# Patient Record
Sex: Female | Born: 1990 | Race: White | Hispanic: No | Marital: Married | Smoking: Former smoker
Health system: Southern US, Community
[De-identification: ages and names within clinical notes are randomized; demographics above are authoritative.]

## PROBLEM LIST (undated history)

## (undated) DIAGNOSIS — O24419 Gestational diabetes mellitus in pregnancy, unspecified control: Secondary | ICD-10-CM

## (undated) DIAGNOSIS — Z789 Other specified health status: Secondary | ICD-10-CM

## (undated) DIAGNOSIS — J45909 Unspecified asthma, uncomplicated: Secondary | ICD-10-CM

## (undated) DIAGNOSIS — E669 Obesity, unspecified: Secondary | ICD-10-CM

## (undated) HISTORY — PX: CHOLECYSTECTOMY: SHX55

## (undated) HISTORY — DX: Obesity, unspecified: E66.9

## (undated) HISTORY — DX: Gestational diabetes mellitus in pregnancy, unspecified control: O24.419

## (undated) HISTORY — DX: Unspecified asthma, uncomplicated: J45.909

---

## 2016-06-23 ENCOUNTER — Encounter: Payer: Self-pay | Admitting: Emergency Medicine

## 2016-06-23 ENCOUNTER — Emergency Department
Admission: EM | Admit: 2016-06-23 | Discharge: 2016-06-24 | Disposition: A | Discharge status: Home / Self Care | Payer: 59 | Attending: Emergency Medicine | Admitting: Emergency Medicine

## 2016-06-23 ENCOUNTER — Emergency Department: Payer: 59

## 2016-06-23 DIAGNOSIS — O200 Threatened abortion: Secondary | ICD-10-CM | POA: Diagnosis not present

## 2016-06-23 DIAGNOSIS — Z87891 Personal history of nicotine dependence: Secondary | ICD-10-CM | POA: Insufficient documentation

## 2016-06-23 DIAGNOSIS — Z3A01 Less than 8 weeks gestation of pregnancy: Secondary | ICD-10-CM | POA: Diagnosis not present

## 2016-06-23 DIAGNOSIS — R7989 Other specified abnormal findings of blood chemistry: Secondary | ICD-10-CM | POA: Diagnosis present

## 2016-06-23 LAB — CBC WITH DIFFERENTIAL/PLATELET
BASOS ABS: 0.1 10*3/uL (ref 0–0.1)
BASOS PCT: 1 %
Eosinophils Absolute: 0.8 10*3/uL — ABNORMAL HIGH (ref 0–0.7)
Eosinophils Relative: 8 %
HEMATOCRIT: 42.8 % (ref 35.0–47.0)
HEMOGLOBIN: 15 g/dL (ref 12.0–16.0)
LYMPHS PCT: 21 %
Lymphs Abs: 2.3 10*3/uL (ref 1.0–3.6)
MCH: 30 pg (ref 26.0–34.0)
MCHC: 35.1 g/dL (ref 32.0–36.0)
MCV: 85.4 fL (ref 80.0–100.0)
Monocytes Absolute: 0.7 10*3/uL (ref 0.2–0.9)
Monocytes Relative: 6 %
NEUTROS ABS: 7 10*3/uL — AB (ref 1.4–6.5)
NEUTROS PCT: 64 %
Platelets: 310 10*3/uL (ref 150–440)
RBC: 5.01 MIL/uL (ref 3.80–5.20)
RDW: 12.9 % (ref 11.5–14.5)
WBC: 10.9 10*3/uL (ref 3.6–11.0)

## 2016-06-23 LAB — HCG, QUANTITATIVE, PREGNANCY: hCG, Beta Chain, Quant, S: 935 m[IU]/mL — ABNORMAL HIGH (ref <5)

## 2016-06-23 NOTE — ED Triage Notes (Signed)
Patient reports that she is 6 weeks and 5 days pregnant. Patient was sent by her ob because her hcg is not doubling and he wants her evaluated for ectopic pregnancy. Patient denies pain or vaginal bleeding.

## 2016-06-23 NOTE — ED Notes (Signed)
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  Pt was contacted by her MD and advised to come into ED for possible ectopic pregnancy.  Pt reports hCG levels not doubling as expected.  Pt denies any bleeding, denies any pain.  Pt A/Ox4, no immediate distress at this time.

## 2016-06-23 NOTE — ED Provider Notes (Signed)
[facility] Emergency Department Provider Note  ____________________________________________  Time seen: Approximately 8:10 PM  I have reviewed the triage vital signs and the nursing notes.   HISTORY  Chief Complaint Abnormal Lab  HPI Jennifer Best is a 25 y.o. female who presents to the emergency department for evaluation to rule out an ectopic pregnancy.  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 Her last beta HCG was drawn on Wednesday and the numbers "have not doubled." Her OB/GYN in [place] requested that she come to the ER for evaluation. She denies abdominal pain, vaginal discharge or bleeding. She denies nausea. She states that she feels like it's a "normal pregnancy."   History reviewed. No pertinent past medical history.  There are no active problems to display for this patient.   Past Surgical History:  Procedure Laterality Date  . CHOLECYSTECTOMY      Prior to Admission medications   Not on File    Allergies Latex and Morphine and related  No family history on file.  Social History Social History  Substance Use Topics  . Smoking status: Former Games developer  . Smokeless tobacco: Former Neurosurgeon  . Alcohol use Not on file    Review of Systems Constitutional: Negative for fever. Respiratory: Negative for shortness of breath or cough. Gastrointestinal: Negative for abdominal pain; Negative for nausea , Negative for vomiting. Genitourinary: Negative for dysuria , Negative for vaginal discharge. Musculoskeletal: Negative for back pain. Skin: Negative for lesion, wound, or rash. ____________________________________________   PHYSICAL EXAM:  VITAL SIGNS: ED Triage Vitals [06/23/16 1936]  Enc Vitals Group     BP 133/83     Pulse Rate 85     Resp 18     Temp 98.2 F (36.8 C)     Temp Source Oral     SpO2 99 %     Weight 210 lb (95.3 kg)     Height 5' 5.5" (1.664 m)     Head Circumference      Peak  Flow      Pain Score      Pain Loc      Pain Edu?      Excl. in GC?     Constitutional: Alert and oriented. Well appearing and in no acute distress. Eyes: Conjunctivae are normal. PERRL. EOMI. Head: Atraumatic. Nose: No congestion/rhinnorhea. Mouth/Throat: Mucous membranes are moist. Respiratory: Normal respiratory effort.  No retractions. Gastrointestinal: Soft, non-tender, no guarding or rebound. Genitourinary: Pelvic exam: deferred Musculoskeletal: No extremity tenderness nor edema.  Neurologic:  Normal speech and language. No gross focal neurologic deficits are appreciated. Speech is normal. No gait instability. Skin:  Skin is warm, dry and intact. No rash noted. Psychiatric: Mood and affect are normal. Speech and behavior are normal.  ____________________________________________   LABS (all labs ordered are listed, but only abnormal results are displayed)  Labs Reviewed - No data to display ____________________________________________  RADIOLOGY  Pending ____________________________________________   PROCEDURES  Procedure(s) performed: None  ____________________________________________   INITIAL IMPRESSION / ASSESSMENT AND PLAN / ED COURSE  Pertinent labs & imaging results that were available during my care of the patient were reviewed by me and considered in my medical decision making (see chart for details).  Patient transferred to room 7 to await ultrasound. [place] tech reports she is next to be seen. Report given to Dr. Manson Passey at 2320 and care relinquished at that time. ____________________________________________   FINAL CLINICAL IMPRESSION(S) / ED DIAGNOSES  Final diagnoses:  None    Note:  This document was prepared using Dragon voice recognition software and may include unintentional dictation errors.    Chinita Pester, FNP 06/23/16 [phone]

## 2016-06-24 NOTE — ED Provider Notes (Signed)
I assumed care of the patient from nurse practitioner Triplett approximately 11:00 PM ultrasound pending which revealed: CLINICAL DATA:  25 year old female with positive HCG levels presenting with evaluation for pregnancy. EXAM: OBSTETRIC <14 WK [place] AND TRANSVAGINAL OB US TECHNIQUE: Both transabdominal and transvaginal ultrasound examinations were performed for complete evaluation of the gestation as well as the maternal uterus, adnexal regions, and pelvic cul-de-sac. Transvaginal technique was performed to assess early pregnancy. COMPARISON:  None. FINDINGS: The uterus is retroverted and appears heterogeneous. There is an irregular fluid collection in the lower uterine canal. No fetal pole or yolk sac identified within this fluid collection. This may represent an abnormal gestational sac, a blighted ovum. A pseudo gestation of an ectopic pregnancy is not excluded. If this fluid collection is a true gestational sac the estimated gestational age based on mean sac diameter of 11 mm is 5 weeks, 6 days. No subchorionic hemorrhage identified. The maternal ovaries appear unremarkable. No free fluid noted within pelvis IMPRESSION: Irregular in fluid collection in the lower endometrium may represent an abnormal gestational sac or a blighted ovum. No fetal pole or yolk sac identified. Correlation with clinical exam and follow-up with serial HCG levels and ultrasound recommended. Electronically Signed   By: Elgie Collard M.D.   On: 06/24/2016 01:08 Spoke with the patient at length regarding the ultrasound findings and the need for follow-up with OB/GYN. Patient advised to return to emergency department immediately if she experienced abdominal pain and vaginal bleeding or any emergency medical concerns.   Darci Current, MD 06/24/16 [phone]

## 2016-06-27 ENCOUNTER — Other Ambulatory Visit: Payer: 59

## 2016-06-27 ENCOUNTER — Other Ambulatory Visit: Payer: Self-pay | Admitting: Obstetrics and Gynecology

## 2016-06-27 DIAGNOSIS — O2691 Pregnancy related conditions, unspecified, first trimester: Secondary | ICD-10-CM

## 2016-06-27 LAB — BETA HCG QUANT (REF LAB): hCG Quant: 1297 m[IU]/mL

## 2016-06-28 ENCOUNTER — Ambulatory Visit: Payer: 59 | Admitting: Obstetrics and Gynecology

## 2016-06-28 ENCOUNTER — Encounter: Payer: Self-pay | Admitting: Obstetrics and Gynecology

## 2016-06-28 ENCOUNTER — Ambulatory Visit (INDEPENDENT_AMBULATORY_CARE_PROVIDER_SITE_OTHER): Payer: 59 | Admitting: Obstetrics and Gynecology

## 2016-06-28 VITALS — BP 111/74 | HR 81 | Ht 65.51 in | Wt 215.0 lb

## 2016-06-28 DIAGNOSIS — E669 Obesity, unspecified: Secondary | ICD-10-CM

## 2016-06-28 DIAGNOSIS — O09811 Supervision of pregnancy resulting from assisted reproductive technology, first trimester: Secondary | ICD-10-CM

## 2016-06-28 DIAGNOSIS — O2691 Pregnancy related conditions, unspecified, first trimester: Secondary | ICD-10-CM | POA: Diagnosis not present

## 2016-06-30 ENCOUNTER — Other Ambulatory Visit: Payer: Self-pay

## 2016-06-30 ENCOUNTER — Ambulatory Visit (INDEPENDENT_AMBULATORY_CARE_PROVIDER_SITE_OTHER): Payer: 59 | Admitting: Obstetrics and Gynecology

## 2016-06-30 ENCOUNTER — Other Ambulatory Visit: Payer: Self-pay | Admitting: Obstetrics and Gynecology

## 2016-06-30 ENCOUNTER — Ambulatory Visit: Payer: 59 | Admitting: Obstetrics and Gynecology

## 2016-06-30 ENCOUNTER — Encounter: Payer: Self-pay | Admitting: Obstetrics and Gynecology

## 2016-06-30 ENCOUNTER — Other Ambulatory Visit: Payer: 59

## 2016-06-30 ENCOUNTER — Ambulatory Visit (INDEPENDENT_AMBULATORY_CARE_PROVIDER_SITE_OTHER): Payer: 59

## 2016-06-30 ENCOUNTER — Other Ambulatory Visit: Payer: Self-pay | Admitting: Internal Medicine

## 2016-06-30 ENCOUNTER — Inpatient Hospital Stay: Payer: 59 | Attending: Internal Medicine

## 2016-06-30 ENCOUNTER — Telehealth: Payer: Self-pay | Admitting: Internal Medicine

## 2016-06-30 VITALS — BP 128/84 | HR 83 | Temp 98.3°F | Resp 20

## 2016-06-30 DIAGNOSIS — O359XX0 Maternal care for (suspected) fetal abnormality and damage, unspecified, not applicable or unspecified: Secondary | ICD-10-CM

## 2016-06-30 DIAGNOSIS — Z8759 Personal history of other complications of pregnancy, childbirth and the puerperium: Secondary | ICD-10-CM | POA: Insufficient documentation

## 2016-06-30 DIAGNOSIS — O009 Unspecified ectopic pregnancy without intrauterine pregnancy: Secondary | ICD-10-CM

## 2016-06-30 DIAGNOSIS — O0091 Unspecified ectopic pregnancy with intrauterine pregnancy: Secondary | ICD-10-CM

## 2016-06-30 DIAGNOSIS — O008 Other ectopic pregnancy without intrauterine pregnancy: Secondary | ICD-10-CM | POA: Diagnosis present

## 2016-06-30 DIAGNOSIS — Z79899 Other long term (current) drug therapy: Secondary | ICD-10-CM | POA: Diagnosis not present

## 2016-06-30 DIAGNOSIS — O2691 Pregnancy related conditions, unspecified, first trimester: Secondary | ICD-10-CM

## 2016-06-30 DIAGNOSIS — O3680X0 Pregnancy with inconclusive fetal viability, not applicable or unspecified: Secondary | ICD-10-CM

## 2016-06-30 LAB — COMPREHENSIVE METABOLIC PANEL
ALT: 60 IU/L — AB (ref 0–32)
AST: 29 IU/L (ref 0–40)
Albumin/Globulin Ratio: 1.7 (ref 1.2–2.2)
Albumin: 4 g/dL (ref 3.5–5.5)
Alkaline Phosphatase: 98 IU/L (ref 39–117)
BILIRUBIN TOTAL: 0.3 mg/dL (ref 0.0–1.2)
BUN/Creatinine Ratio: 17 (ref 9–23)
BUN: 11 mg/dL (ref 6–20)
CALCIUM: 8.8 mg/dL (ref 8.7–10.2)
CHLORIDE: 105 mmol/L (ref 96–106)
CO2: 19 mmol/L (ref 18–29)
CREATININE: 0.65 mg/dL (ref 0.57–1.00)
GFR calc Af Amer: 142 mL/min/{1.73_m2} (ref >59)
GFR, EST NON AFRICAN AMERICAN: 123 mL/min/{1.73_m2} (ref >59)
GLUCOSE: 95 mg/dL (ref 65–99)
Globulin, Total: 2.3 g/dL (ref 1.5–4.5)
Potassium: 3.8 mmol/L (ref 3.5–5.2)
Sodium: 143 mmol/L (ref 134–144)
TOTAL PROTEIN: 6.3 g/dL (ref 6.0–8.5)

## 2016-06-30 LAB — ABO/RH: Rh Factor: POSITIVE

## 2016-06-30 LAB — BETA HCG QUANT (REF LAB): HCG QUANT: 1607 m[IU]/mL

## 2016-06-30 MED ORDER — PROCHLORPERAZINE MALEATE 10 MG PO TABS
10.0000 mg | ORAL_TABLET | Freq: Once | ORAL | Status: AC
  Administered 2016-06-30: 10 mg via ORAL
  Filled 2016-06-30: qty 1

## 2016-06-30 MED ORDER — METHOTREXATE SODIUM (PF) CHEMO INJECTION 250 MG/10ML
100.0000 mg | Freq: Once | INTRAMUSCULAR | Status: AC
  Administered 2016-06-30: 100 mg via INTRAMUSCULAR
  Filled 2016-06-30: qty 4

## 2016-06-30 MED ORDER — METHOTREXATE INJECTION FOR WOMEN'S HOSPITAL: 50.0000 mg/m2 | Freq: Once | INTRAMUSCULAR | Status: DC

## 2016-06-30 NOTE — Telephone Encounter (Signed)
Discussed with Dr.Cherry. Reviewed labs. Proceed with IV methotrxate today. Dr.B

## 2016-06-30 NOTE — Patient Instructions (Signed)
You will need to return on next Tuesday (07/04/16, and 07/07/16 for next blood draw)   Ectopic Pregnancy An ectopic pregnancy is when the fertilized egg attaches (implants) outside the uterus. Most ectopic pregnancies occur in the fallopian tube. Rarely do ectopic pregnancies occur on the ovary, intestine, pelvis, or cervix. In an ectopic pregnancy, the fertilized egg does not have the ability to develop into a normal, healthy baby.  A ruptured ectopic pregnancy is one in which the fallopian tube gets torn or bursts and results in internal bleeding. Often there is intense abdominal pain, and sometimes, vaginal bleeding. Having an ectopic pregnancy can be life threatening. If left untreated, this dangerous condition can lead to a blood transfusion, abdominal surgery, or even death. CAUSES  Damage to the fallopian tubes is the suspected cause in most ectopic pregnancies.  RISK FACTORS Depending on your circumstances, the risk of having an ectopic pregnancy will vary. The level of risk can be divided into three categories. High Risk  You have gone through infertility treatment.  You have had a previous ectopic pregnancy.  You have had previous tubal surgery.  You have had previous surgery to have the fallopian tubes tied (tubal ligation).  You have tubal problems or diseases.  You have been exposed to DES. DES is a medicine that was used until 1971 and had effects on babies whose mothers took the medicine.  You become pregnant while using an intrauterine device (IUD) for birth control. Moderate Risk  You have a history of infertility.  You have a history of a sexually transmitted infection (STI).  You have a history of pelvic inflammatory disease (PID).  You have scarring from endometriosis.  You have multiple sexual partners.  You smoke. Low Risk  You have had previous pelvic surgery.  You use vaginal douching.  You became sexually active before 26 years of age. SIGNS AND  SYMPTOMS  An ectopic pregnancy should be suspected in anyone who has missed a period and has abdominal pain or bleeding.  You may experience normal pregnancy symptoms, such as:  Nausea.  Tiredness.  Breast tenderness.  Other symptoms may include:  Pain with intercourse.  Irregular vaginal bleeding or spotting.  Cramping or pain on one side or in the lower abdomen.  Fast heartbeat.  Passing out while having a bowel movement.  Symptoms of a ruptured ectopic pregnancy and internal bleeding may include:  Sudden, severe pain in the abdomen and pelvis.  Dizziness or fainting.  Pain in the shoulder area. DIAGNOSIS  Tests that may be performed include:  A pregnancy test.  An ultrasound test.  Testing the specific level of pregnancy hormone in the bloodstream.  Taking a sample of uterus tissue (dilation and curettage, D&C).  Surgery to perform a visual exam of the inside of the abdomen using a thin, lighted tube with a tiny camera on the end (laparoscope). TREATMENT  An injection of a medicine called methotrexate may be given. This medicine causes the pregnancy tissue to be absorbed. It is given if:  The diagnosis is made early.  The fallopian tube has not ruptured.  You are considered to be a good candidate for the medicine. Usually, pregnancy hormone blood levels are checked after methotrexate treatment. This is to be sure the medicine is effective. It may take 4-6 weeks for the pregnancy to be absorbed (though most pregnancies will be absorbed by 3 weeks). Surgical treatment may be needed. A laparoscope may be used to remove the pregnancy tissue. If severe  internal bleeding occurs, a cut (incision) may be made in the lower abdomen (laparotomy), and the ectopic pregnancy is removed. This stops the bleeding. Part of the fallopian tube, or the whole tube, may be removed as well (salpingectomy). After surgery, pregnancy hormone tests may be done to be sure there is no  pregnancy tissue left. You may receive a Rho (D) immune globulin shot if you are Rh negative and the father is Rh positive, or if you do not know the Rh type of the father. This is to prevent problems with any future pregnancy. SEEK IMMEDIATE MEDICAL CARE IF:  You have any symptoms of an ectopic pregnancy. This is a medical emergency. MAKE SURE YOU:  Understand these instructions.  Will watch your condition.  Will get help right away if you are not doing well or get worse.   This information is not intended to replace advice given to you by your health care provider. Make sure you discuss any questions you have with your health care provider.   Document Released: 12/21/2004 Document Revised: 12/04/2014 Document Reviewed: 06/12/2013 Elsevier Interactive Patient Education 2016 Elsevier Inc.   Methotrexate Treatment for an Ectopic Pregnancy Methotrexate is a medicine that treats ectopic pregnancy by stopping the growth of the fertilized egg. It also helps your body absorb tissue from the egg. This takes between 2 weeks and 6 weeks. Most ectopic pregnancies can be successfully treated with methotrexate if they are detected early enough. LET [facility] CARE PROVIDER KNOW ABOUT:  Any allergies you have.  All medicines you are taking, including vitamins, herbs, eye drops, creams, and over-the-counter medicines.  Medical conditions you have. RISKS AND COMPLICATIONS Generally, this is a safe treatment. However, as with any treatment, problems can occur. Possible problems or side effects include:  Nausea.  Vomiting.  Diarrhea.  Abdominal cramping.  Mouth sores.  Increased vaginal bleeding or spotting.   Swelling or irritation of the lining of your lungs (pneumonitis).  Failed treatment and continuation of the pregnancy.   Liver damage.  Hair loss. There is still a risk of the ectopic pregnancy rupturing while using the methotrexate. BEFORE THE PROCEDURE Before you take  the medicine:   Liver tests, kidney tests, and a complete blood test are performed.  Blood tests are performed to measure the pregnancy hormone levels and to determine your blood type.  If you are Rh-negative and the father is Rh-positive or his Rh type is not known, you will be given a Rho (D) immune globulin shot. PROCEDURE  There are two methods that your health care provider may use to prescribe methotrexate. One method involves a single dose or injection of the medicine. Another method involves a series of doses given through several injections.  AFTER THE PROCEDURE  You may have some abdominal cramping, vaginal bleeding, and fatigue in the first few days after taking methotrexate.  Blood tests will be taken for several weeks to check the pregnancy hormone levels. The blood tests are performed until there is no more pregnancy hormone detected in the blood.   This information is not intended to replace advice given to you by your health care provider. Make sure you discuss any questions you have with your health care provider.   Document Released: 11/07/2001 Document Revised: 12/04/2014 Document Reviewed: 09/01/2013 Elsevier Interactive Patient Education [employer]

## 2016-06-30 NOTE — Progress Notes (Signed)
GYNECOLOGY PROGRESS NOTE  Subjective:    Patient ID: Jennifer Best, female    DOB: [birth year], 25 y.o.   MRN: [id number]  HPI  Patient is a 25 y.o. G61P2000 female who presents for follow up ultrasound and BHCG for abnormal pregnancy (abnormal HCG rise). Patient with h/o IVF, is a surrogate. Currently denies complaints today.  The following portions of the patient's history were reviewed and updated as appropriate: allergies, current medications, past family history, past medical history, past social history, past surgical history and problem list.  Review of Systems Pertinent items noted in HPI and remainder of comprehensive ROS otherwise negative.   Objective:   Blood pressure (!) 122/91, pulse 78, weight 214 lb 11.2 oz (97.4 kg), last menstrual period 05/04/2016. General appearance: alert and no distress Exam deferred.    Labs:  Results for URIANA, BUECHE (MRN [id number]) as of 06/30/2016 10:23  Ref. Range 06/23/2016 20:23 06/27/2016 11:33  HCG, Beta Chain, Quant, S Latest Ref Range: <5 mIU/mL 935 (H) 1,297  WBC Latest Ref Range: 3.6 - 11.0 K/uL 10.9   RBC Latest Ref Range: 3.80 - 5.20 MIL/uL 5.01   Hemoglobin Latest Ref Range: 12.0 - 16.0 g/dL 94.5   HCT Latest Ref Range: 35.0 - 47.0 % 42.8   MCV Latest Ref Range: 80.0 - 100.0 fL 85.4   MCH Latest Ref Range: 26.0 - 34.0 pg 30.0   MCHC Latest Ref Range: 32.0 - 36.0 g/dL 03.8   RDW Latest Ref Range: 11.5 - 14.5 % 12.9   Platelets Latest Ref Range: 150 - 440 K/uL 310   Neutrophils Latest Units: % 64   Lymphocytes Latest Units: % 21   Monocytes Relative Latest Units: % 6   Eosinophil Latest Units: % 8   Basophil Latest Units: % 1   NEUT# Latest Ref Range: 1.4 - 6.5 K/uL 7.0 (H)   Lymphocyte # Latest Ref Range: 1.0 - 3.6 K/uL 2.3   Monocyte # Latest Ref Range: 0.2 - 0.9 K/uL 0.7   Eosinophils Absolute Latest Ref Range: 0 - 0.7 K/uL 0.8 (H)   Basophils Absolute Latest Ref Range: 0 - 0.1 K/uL 0.1   hCG Quant Latest Units: mIU/mL  1     Imaging:  EXAM ULTRASOUND OB < [redacted] WEEKS GESTATION DATE OF PROCEDURE: 06/24/2016 FINDINGS: The uterus is retroverted and appears heterogeneous. There is an irregular fluid collection in the lower uterine canal. No fetal pole or yolk sac identified within this fluid collection. This may represent an abnormal gestational sac, a blighted ovum. A pseudo gestation of an ectopic pregnancy is not excluded. If this fluid collection is a true gestational sac the estimated gestational age based on mean sac diameter of 11 mm is 5 weeks, 6 days. No subchorionic hemorrhage identified. The maternal ovaries appear unremarkable. No free fluid noted within pelvis  IMPRESSION: Irregular in fluid collection in the lower endometrium may represent an abnormal gestational sac or a blighted ovum. No fetal pole or yolk sac identified. Correlation with clinical exam and follow-up with serial HCG levels and ultrasound recommended.   EXAM OB US < [redacted] WEEKS GESTATION DATE OF PROCEDURE: 06/30/2016 Indications: IVF for Dating Findings:  The uterus is anteflexed. No gestational sac or fetal pole is seen. The endometrium appears WNL with no gestational sac visualized.   Right Ovary measures 3.0 x 2.1 x 1.9 cm, and appears WNL. The right adnexa also was visualized and appears WNL. Left Ovary measures 1.8 x 1.5 x 1.6 cm. It  appears WNL. Survey of the left adnexa demonstrates a hyperechoic mass with a cystic appearing center adjacent to the left ovary which is highly suspicious for an ectopic pregnancy. There is no evidence of a corpus luteal cyst.  There is no free peritoneal fluid in the cul de sac.  Impression: 1. The uterus is anteflexed. No gestational sac or fetal pole is visualized. 2. In the left adnexa, adjacent to the left ovary, there is a mass that is highly suspicious for an ectopic pregnancy.  Assessment:   Left ectopic pregnancy Pregnancy from IVF  Plan:   - Patient with left ectopic pregnancy  noted on today's ultrasound.  BHCG levels have been abnormally rising, consistent with diagnosis.  Discussion had regarding ectopic pregnancy with patient.  Discussed need for management of ectopic, including medical management with MTX or surgical intervention.  Discussed risks/benefits of both.  After discussion, patient ok to proceed with MTX therapy.   - Will order Type and Screen, repeat BHCG, and CMP. - Will have MTX therapy administered today at [facility].  - Patient understands importance of close follow up.  Will need to return for Day #4 and Day #7 HCGs, then weekly thereafter until hormone levels are negative.  - Given ectopic precautions.    A total of 15 minutes were spent face-to-face with the patient during this encounter and over half of that time dealt with counseling and coordination of care.  Hildred Laser, MD [facility]

## 2016-06-30 NOTE — Progress Notes (Signed)
GYNECOLOGY PROGRESS NOTE  Subjective:    Patient ID: Jennifer Best, female    DOB: Aug 19, 1991, 25 y.o.   MRN: [id number]  HPI  Patient is a 25 y.o. G10P2002 female, EGA [redacted]w[redacted]d, EDD 02/12/2016 (dated by date of 5-day embryo transfer, on 05/26/2016)  who presents as a follow-up from the Emergency Room for possible miscarriage (cannot r/o ectopic pregnancy).  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  She has been having serial BHCGs drawn, and when they were noted to not have an appropriate rise she was encouraged to go to the Emergency Room for evaluation.  At that time patient denied any complaints (no abdominal pain, bleeding, nausea/vomiting).  Continues to deny complaints today.   Past Medical History:  Diagnosis Date  . Obesity (BMI 30-39.9)     OB History  Gravida Para Term Preterm AB Living  3 2 2         SAB TAB Ectopic Multiple Live Births               # Outcome Date GA Lbr Len/2nd Weight Sex Delivery Anes PTL Lv  3 Current           2 Term 2015   7 lb 1.6 oz (3.221 kg) F Vag-Spont     1 Term 2012   6 lb 2.2 oz (2.785 kg) F Vag-Spont         Family History  Problem Relation Age of Onset  . Lung cancer Mother   . Breast cancer Maternal Grandmother   . Lung cancer Paternal Grandmother   . Prostate cancer Paternal Grandfather     Past Surgical History:  Procedure Laterality Date  . CHOLECYSTECTOMY      Social History   Social History  . Marital status: Single    Spouse name: N/A  . Number of children: N/A  . Years of education: N/A   Occupational History  . Not on file.   Social History Main Topics  . Smoking status: Former Games developer  . Smokeless tobacco: Former Neurosurgeon  . Alcohol use No  . Drug use: No  . Sexual activity: Not Currently   Other Topics Concern  . Not on file   Social History Narrative  . No narrative on file     Allergies  Allergen Reactions  . Latex   . Morphine And Related      Outpatient Encounter Prescriptions as of 06/28/2016  Medication Sig  . Prenatal Vit-Fe Fumarate-FA (MULTIVITAMIN-PRENATAL) 27-0.8 MG TABS tablet Take 1 tablet by mouth daily at 12 noon.  . estrogens conjugated, synthetic A, (CENESTIN) 1.25 MG tablet Take 1.25 mg by mouth daily.  . progesterone 50 MG/ML injection Inject 10 mg into the muscle daily.   No facility-administered encounter medications on file as of 06/28/2016.     Review of Systems Pertinent items noted in HPI and remainder of comprehensive ROS otherwise negative.   Objective:   Blood pressure 111/74, pulse 81, height 5' 5.51" (1.664 m), weight 215 lb (97.5 kg), last menstrual period 05/04/2016. Body mass index is 35.22 kg/m.  General appearance: alert and no distress, moderately obese. Abdomen: Non-tender, no palpable masses.  Remainder of exam deferred.    Labs:  Results for SHANIQA, MCGEORGE (MRN [id number]) as of 06/30/2016 10:23  Ref. Range 06/23/2016 20:23 06/27/2016 11:33  HCG, Beta Chain, Quant, S Latest Ref Range: <5 mIU/mL 935 (H) 1,297 (H)  WBC Latest Ref Range: 3.6 -  11.0 K/uL 10.9   RBC Latest Ref Range: 3.80 - 5.20 MIL/uL 5.01   Hemoglobin Latest Ref Range: 12.0 - 16.0 g/dL 09.8   HCT Latest Ref Range: 35.0 - 47.0 % 42.8   MCV Latest Ref Range: 80.0 - 100.0 fL 85.4   MCH Latest Ref Range: 26.0 - 34.0 pg 30.0   MCHC Latest Ref Range: 32.0 - 36.0 g/dL 11.9   RDW Latest Ref Range: 11.5 - 14.5 % 12.9   Platelets Latest Ref Range: 150 - 440 K/uL 310   Neutrophils Latest Units: % 64   Lymphocytes Latest Units: % 21   Monocytes Relative Latest Units: % 6   Eosinophil Latest Units: % 8   Basophil Latest Units: % 1   NEUT# Latest Ref Range: 1.4 - 6.5 K/uL 7.0 (H)   Lymphocyte # Latest Ref Range: 1.0 - 3.6 K/uL 2.3   Monocyte # Latest Ref Range: 0.2 - 0.9 K/uL 0.7   Eosinophils Absolute Latest Ref Range: 0 - 0.7 K/uL 0.8 (H)   Basophils Absolute Latest Ref Range: 0 - 0.1 K/uL 0.1   hCG Quant  Latest Units: mIU/mL       Imaging:  EXAM ULTRASOUND OB < [redacted] WEEKS GESTATION DATE OF PROCEDURE: 06/24/2016 FINDINGS: The uterus is retroverted and appears heterogeneous. There is an irregular fluid collection in the lower uterine canal. No fetal pole or yolk sac identified within this fluid collection. This may represent an abnormal gestational sac, a blighted ovum. A pseudo gestation of an ectopic pregnancy is not excluded. If this fluid collection is a true gestational sac the estimated gestational age based on mean sac diameter of 11 mm is 5 weeks, 6 days. No subchorionic hemorrhage identified. The maternal ovaries appear unremarkable. No free fluid noted within pelvis  IMPRESSION: Irregular in fluid collection in the lower endometrium may represent an abnormal gestational sac or a blighted ovum. No fetal pole or yolk sac identified. Correlation with clinical exam and follow-up with serial HCG levels and ultrasound recommended.  Assessment:   Abnormal pregnancy, possible blighted ovum (cannot r/o ectopic) Recent h/o IVF (5-day embryo transfer)  Plan:   - Discussion had with regards to recent labs and ultrasound.  Possible blighted ovum, but cannot r/o ectopic.  Recent BHCGlevels increasing, but not appropriately doubling.  Concern Will have patient f/u in 2 days for repeat BHCG and ultrasound.  If BHCG levels continuing to rise, should be able to better discern pregnancy on ultrasound this time.  Patient given SAB/ectopic precautions.  - Patient reports that she discontinued taking her fertility hormones.  Encouraged to resume until official diagnosis has been made.  - Will attempt to get previous medical records from [place].    Hildred Laser, MD [facility]

## 2016-06-30 NOTE — Patient Instructions (Signed)
Return in 2 days for ultrasound and blood draw.

## 2016-07-03 ENCOUNTER — Telehealth: Payer: Self-pay

## 2016-07-03 ENCOUNTER — Other Ambulatory Visit: Payer: 59

## 2016-07-03 ENCOUNTER — Other Ambulatory Visit: Payer: Self-pay | Admitting: Obstetrics and Gynecology

## 2016-07-03 DIAGNOSIS — O009 Unspecified ectopic pregnancy without intrauterine pregnancy: Secondary | ICD-10-CM

## 2016-07-03 NOTE — Telephone Encounter (Signed)
Called pt she states that she just came in today for blood work, advised pt to come in on Friday for blood work. Pt on lab schedule for 8:15am.

## 2016-07-03 NOTE — Telephone Encounter (Signed)
-----   Message from Hildred LaserAnika Cherry, MD sent at 07/03/2016  1:39 PM EDT ----- Regarding: lab draw Please inform patient of need to return for BHCG levels on tomorrow and Friday to ensure levels are trending downward.

## 2016-07-04 LAB — BETA HCG QUANT (REF LAB): HCG QUANT: 2243 m[IU]/mL

## 2016-07-07 ENCOUNTER — Other Ambulatory Visit: Payer: Self-pay

## 2016-07-07 ENCOUNTER — Other Ambulatory Visit: Payer: Self-pay | Admitting: Obstetrics and Gynecology

## 2016-07-07 ENCOUNTER — Other Ambulatory Visit: Payer: 59

## 2016-07-07 DIAGNOSIS — O009 Unspecified ectopic pregnancy without intrauterine pregnancy: Secondary | ICD-10-CM

## 2016-07-08 LAB — BETA HCG QUANT (REF LAB): HCG QUANT: 1728 m[IU]/mL

## 2016-07-10 NOTE — Progress Notes (Signed)
Letter of medical necessity written for methotrexate, as [facility] due to prior authorization for medication.

## 2016-07-11 ENCOUNTER — Telehealth: Payer: Self-pay

## 2016-07-11 ENCOUNTER — Other Ambulatory Visit: Payer: 59

## 2016-07-11 ENCOUNTER — Other Ambulatory Visit: Payer: Self-pay | Admitting: Obstetrics and Gynecology

## 2016-07-11 NOTE — Telephone Encounter (Signed)
-----   Message from Hildred LaserAnika Cherry, MD sent at 07/11/2016  1:43 PM EDT ----- Please have patient follow up for additional blood work.  Will need another repeat BHCG and repeat CBC and Chem 14.  If hormone levels are hanging steady, she may require an addition dose of methotrexate which we will arrange.  If continuing to decline, she will just need weekly hormone level checks until it returns to negative range.

## 2016-07-11 NOTE — Telephone Encounter (Signed)
Called pt informed her of the information below. Pt gave verbal understanding.  

## 2016-07-12 LAB — BETA HCG QUANT (REF LAB): HCG QUANT: 1299 m[IU]/mL

## 2016-07-17 ENCOUNTER — Encounter
Admission: EM | Disposition: A | Discharge status: Home / Self Care | Payer: Self-pay | Source: Home / Self Care | Attending: Emergency Medicine

## 2016-07-17 ENCOUNTER — Observation Stay
Admission: EM | Admit: 2016-07-17 | Discharge: 2016-07-18 | Disposition: A | Discharge status: Home / Self Care | Payer: 59 | Attending: Obstetrics and Gynecology | Admitting: Obstetrics and Gynecology

## 2016-07-17 ENCOUNTER — Emergency Department: Payer: 59

## 2016-07-17 ENCOUNTER — Emergency Department: Payer: 59 | Admitting: Anesthesiology

## 2016-07-17 DIAGNOSIS — Z87891 Personal history of nicotine dependence: Secondary | ICD-10-CM | POA: Insufficient documentation

## 2016-07-17 DIAGNOSIS — Z8042 Family history of malignant neoplasm of prostate: Secondary | ICD-10-CM | POA: Insufficient documentation

## 2016-07-17 DIAGNOSIS — R102 Pelvic and perineal pain: Secondary | ICD-10-CM | POA: Diagnosis present

## 2016-07-17 DIAGNOSIS — O001 Tubal pregnancy without intrauterine pregnancy: Principal | ICD-10-CM | POA: Insufficient documentation

## 2016-07-17 DIAGNOSIS — Z6830 Body mass index (BMI) 30.0-30.9, adult: Secondary | ICD-10-CM | POA: Diagnosis not present

## 2016-07-17 DIAGNOSIS — E669 Obesity, unspecified: Secondary | ICD-10-CM | POA: Insufficient documentation

## 2016-07-17 DIAGNOSIS — O009 Unspecified ectopic pregnancy without intrauterine pregnancy: Secondary | ICD-10-CM | POA: Diagnosis not present

## 2016-07-17 DIAGNOSIS — Z803 Family history of malignant neoplasm of breast: Secondary | ICD-10-CM | POA: Diagnosis not present

## 2016-07-17 DIAGNOSIS — Z801 Family history of malignant neoplasm of trachea, bronchus and lung: Secondary | ICD-10-CM | POA: Insufficient documentation

## 2016-07-17 DIAGNOSIS — Z885 Allergy status to narcotic agent status: Secondary | ICD-10-CM | POA: Insufficient documentation

## 2016-07-17 DIAGNOSIS — Z91040 Latex allergy status: Secondary | ICD-10-CM | POA: Diagnosis not present

## 2016-07-17 DIAGNOSIS — K661 Hemoperitoneum: Secondary | ICD-10-CM | POA: Diagnosis present

## 2016-07-17 DIAGNOSIS — O00102 Left tubal pregnancy without intrauterine pregnancy: Secondary | ICD-10-CM

## 2016-07-17 HISTORY — PX: UNILATERAL SALPINGECTOMY: SHX6160

## 2016-07-17 HISTORY — PX: LAPAROSCOPY: SHX197

## 2016-07-17 LAB — CBC WITH DIFFERENTIAL/PLATELET
Basophils Absolute: 0.1 10*3/uL (ref 0–0.1)
Basophils Relative: 1 %
EOS ABS: 0.2 10*3/uL (ref 0–0.7)
Eosinophils Relative: 3 %
HCT: 39.9 % (ref 35.0–47.0)
HEMOGLOBIN: 14 g/dL (ref 12.0–16.0)
LYMPHS ABS: 1.4 10*3/uL (ref 1.0–3.6)
LYMPHS PCT: 20 %
MCH: 30.7 pg (ref 26.0–34.0)
MCHC: 35.1 g/dL (ref 32.0–36.0)
MCV: 87.3 fL (ref 80.0–100.0)
MONOS PCT: 6 %
Monocytes Absolute: 0.4 10*3/uL (ref 0.2–0.9)
NEUTROS PCT: 70 %
Neutro Abs: 4.8 10*3/uL (ref 1.4–6.5)
Platelets: 251 10*3/uL (ref 150–440)
RBC: 4.57 MIL/uL (ref 3.80–5.20)
RDW: 12.9 % (ref 11.5–14.5)
WBC: 6.9 10*3/uL (ref 3.6–11.0)

## 2016-07-17 LAB — POCT PREGNANCY, URINE: PREG TEST UR: POSITIVE — AB

## 2016-07-17 LAB — BASIC METABOLIC PANEL
Anion gap: 9 (ref 5–15)
BUN: 6 mg/dL (ref 6–20)
CHLORIDE: 109 mmol/L (ref 101–111)
CO2: 21 mmol/L — AB (ref 22–32)
CREATININE: 0.66 mg/dL (ref 0.44–1.00)
Calcium: 9.3 mg/dL (ref 8.9–10.3)
GFR calc non Af Amer: >60 mL/min (ref >60)
Glucose, Bld: 104 mg/dL — ABNORMAL HIGH (ref 65–99)
POTASSIUM: 3.9 mmol/L (ref 3.5–5.1)
Sodium: 139 mmol/L (ref 135–145)

## 2016-07-17 LAB — HCG, QUANTITATIVE, PREGNANCY: hCG, Beta Chain, Quant, S: 696 m[IU]/mL — ABNORMAL HIGH (ref <5)

## 2016-07-17 LAB — TYPE AND SCREEN
ABO/RH(D): O POS
Antibody Screen: NEGATIVE

## 2016-07-17 SURGERY — UNILATERAL SALPINGECTOMY
Anesthesia: General | Laterality: Left

## 2016-07-17 SURGERY — LAPAROTOMY, EXPLORATORY
Anesthesia: Choice | Laterality: Left

## 2016-07-17 MED ORDER — BUPIVACAINE HCL 0.5 % IJ SOLN
INTRAMUSCULAR | Status: DC | PRN
  Administered 2016-07-17: 10 mL

## 2016-07-17 MED ORDER — OXYCODONE-ACETAMINOPHEN 5-325 MG PO TABS
1.0000 | ORAL_TABLET | ORAL | Status: DC | PRN
  Administered 2016-07-18 (×2): 1 via ORAL
  Filled 2016-07-17: qty 1
  Filled 2016-07-17: qty 2

## 2016-07-17 MED ORDER — KETOROLAC TROMETHAMINE 30 MG/ML IJ SOLN
INTRAMUSCULAR | Status: AC
  Filled 2016-07-17: qty 1

## 2016-07-17 MED ORDER — SODIUM CHLORIDE 0.9 % IV BOLUS (SEPSIS)
1000.0000 mL | Freq: Once | INTRAVENOUS | Status: AC
  Administered 2016-07-17: 1000 mL via INTRAVENOUS

## 2016-07-17 MED ORDER — FENTANYL CITRATE (PF) 100 MCG/2ML IJ SOLN
INTRAMUSCULAR | Status: AC
  Filled 2016-07-17: qty 2

## 2016-07-17 MED ORDER — ONDANSETRON HCL 4 MG PO TABS: 4.0000 mg | ORAL_TABLET | Freq: Four times a day (QID) | ORAL | Status: DC | PRN

## 2016-07-17 MED ORDER — LACTATED RINGERS IV SOLN
INTRAVENOUS | Status: DC
  Administered 2016-07-17: 23:00:00 via INTRAVENOUS

## 2016-07-17 MED ORDER — BISACODYL 10 MG RE SUPP: 10.0000 mg | Freq: Every day | RECTAL | Status: DC | PRN

## 2016-07-17 MED ORDER — LIDOCAINE HCL (CARDIAC) 20 MG/ML IV SOLN
INTRAVENOUS | Status: DC | PRN
  Administered 2016-07-17: 30 mg via INTRAVENOUS

## 2016-07-17 MED ORDER — BUPIVACAINE HCL (PF) 0.5 % IJ SOLN
INTRAMUSCULAR | Status: AC
  Filled 2016-07-17: qty 30

## 2016-07-17 MED ORDER — PROPOFOL 10 MG/ML IV BOLUS
INTRAVENOUS | Status: DC | PRN
  Administered 2016-07-17: 200 mg via INTRAVENOUS

## 2016-07-17 MED ORDER — IBUPROFEN 600 MG PO TABS
600.0000 mg | ORAL_TABLET | Freq: Four times a day (QID) | ORAL | Status: DC | PRN
  Administered 2016-07-18: 600 mg via ORAL
  Filled 2016-07-17: qty 1

## 2016-07-17 MED ORDER — SIMETHICONE 80 MG PO CHEW: 80.0000 mg | CHEWABLE_TABLET | Freq: Four times a day (QID) | ORAL | Status: DC | PRN

## 2016-07-17 MED ORDER — LACTATED RINGERS IV SOLN
INTRAVENOUS | Status: DC | PRN
  Administered 2016-07-17: 21:00:00 via INTRAVENOUS

## 2016-07-17 MED ORDER — MAGNESIUM CITRATE PO SOLN: 1.0000 | Freq: Once | ORAL | Status: DC | PRN

## 2016-07-17 MED ORDER — FENTANYL CITRATE (PF) 100 MCG/2ML IJ SOLN
INTRAMUSCULAR | Status: DC | PRN
  Administered 2016-07-17: 50 ug via INTRAVENOUS
  Administered 2016-07-17: 100 ug via INTRAVENOUS
  Administered 2016-07-17: 50 ug via INTRAVENOUS

## 2016-07-17 MED ORDER — ONDANSETRON HCL 4 MG/2ML IJ SOLN: 4.0000 mg | Freq: Four times a day (QID) | INTRAMUSCULAR | Status: DC | PRN

## 2016-07-17 MED ORDER — MIDAZOLAM HCL 2 MG/2ML IJ SOLN
INTRAMUSCULAR | Status: DC | PRN
  Administered 2016-07-17: 2 mg via INTRAVENOUS

## 2016-07-17 MED ORDER — HYDROMORPHONE HCL 1 MG/ML IJ SOLN: 0.2000 mg | INTRAMUSCULAR | Status: DC | PRN

## 2016-07-17 MED ORDER — DEXAMETHASONE SODIUM PHOSPHATE 10 MG/ML IJ SOLN
INTRAMUSCULAR | Status: DC | PRN
  Administered 2016-07-17: 10 mg via INTRAVENOUS

## 2016-07-17 MED ORDER — ZOLPIDEM TARTRATE 5 MG PO TABS: 5.0000 mg | ORAL_TABLET | Freq: Every evening | ORAL | Status: DC | PRN

## 2016-07-17 MED ORDER — ALUM & MAG HYDROXIDE-SIMETH 200-200-20 MG/5ML PO SUSP: 30.0000 mL | ORAL | Status: DC | PRN

## 2016-07-17 MED ORDER — PROMETHAZINE HCL 25 MG/ML IJ SOLN: 6.2500 mg | INTRAMUSCULAR | Status: DC | PRN

## 2016-07-17 MED ORDER — FENTANYL CITRATE (PF) 100 MCG/2ML IJ SOLN
25.0000 ug | INTRAMUSCULAR | Status: DC | PRN
  Administered 2016-07-17 (×4): 25 ug via INTRAVENOUS

## 2016-07-17 MED ORDER — FENTANYL CITRATE (PF) 100 MCG/2ML IJ SOLN
50.0000 ug | Freq: Once | INTRAMUSCULAR | Status: AC
  Administered 2016-07-17: 50 ug via INTRAVENOUS
  Filled 2016-07-17: qty 2

## 2016-07-17 MED ORDER — ONDANSETRON HCL 4 MG/2ML IJ SOLN
INTRAMUSCULAR | Status: DC | PRN
  Administered 2016-07-17: 4 mg via INTRAVENOUS

## 2016-07-17 MED ORDER — KETOROLAC TROMETHAMINE 30 MG/ML IJ SOLN
30.0000 mg | Freq: Once | INTRAMUSCULAR | Status: AC
  Administered 2016-07-17: 30 mg via INTRAVENOUS

## 2016-07-17 MED ORDER — SUGAMMADEX SODIUM 200 MG/2ML IV SOLN
INTRAVENOUS | Status: DC | PRN
  Administered 2016-07-17: 192.4 mg via INTRAVENOUS

## 2016-07-17 MED ORDER — MENTHOL 3 MG MT LOZG: 1.0000 | LOZENGE | OROMUCOSAL | Status: DC | PRN

## 2016-07-17 MED ORDER — ROCURONIUM BROMIDE 100 MG/10ML IV SOLN
INTRAVENOUS | Status: DC | PRN
  Administered 2016-07-17: 20 mg via INTRAVENOUS

## 2016-07-17 MED ORDER — DOCUSATE SODIUM 100 MG PO CAPS
100.0000 mg | ORAL_CAPSULE | Freq: Two times a day (BID) | ORAL | Status: DC
  Administered 2016-07-18: 100 mg via ORAL
  Filled 2016-07-17: qty 1

## 2016-07-17 MED ORDER — PANTOPRAZOLE SODIUM 40 MG PO TBEC
40.0000 mg | DELAYED_RELEASE_TABLET | Freq: Every day | ORAL | Status: DC
  Administered 2016-07-18: 40 mg via ORAL
  Filled 2016-07-17: qty 1

## 2016-07-17 MED ORDER — SUCCINYLCHOLINE CHLORIDE 20 MG/ML IJ SOLN
INTRAMUSCULAR | Status: DC | PRN
  Administered 2016-07-17: 100 mg via INTRAVENOUS

## 2016-07-17 MED ORDER — ONDANSETRON HCL 4 MG/2ML IJ SOLN
4.0000 mg | Freq: Once | INTRAMUSCULAR | Status: AC
  Administered 2016-07-17: 4 mg via INTRAVENOUS
  Filled 2016-07-17: qty 2

## 2016-07-17 SURGICAL SUPPLY — 38 items
APPLICATOR COTTON TIP 6IN STRL (MISCELLANEOUS) ×2 IMPLANT
BLADE SURG SZ11 CARB STEEL (BLADE) ×2 IMPLANT
CANISTER SUCT 1200ML W/VALVE (MISCELLANEOUS) ×2 IMPLANT
CATH ROBINSON RED A/P 16FR (CATHETERS) ×2 IMPLANT
CHLORAPREP W/TINT 26ML (MISCELLANEOUS) ×2 IMPLANT
CORD MONOPOLAR M/FML 12FT (MISCELLANEOUS) ×2 IMPLANT
GLOVE BIO SURGEON STRL SZ 6 (GLOVE) ×2 IMPLANT
GLOVE BIO SURGEON STRL SZ8 (GLOVE) ×2 IMPLANT
GLOVE BIOGEL PI IND STRL 6.5 (GLOVE) ×1 IMPLANT
GLOVE BIOGEL PI INDICATOR 6.5 (GLOVE) ×1
GLOVE INDICATOR 8.0 STRL GRN (GLOVE) ×2 IMPLANT
GOWN STRL REUS W/ TWL LRG LVL3 (GOWN DISPOSABLE) ×2 IMPLANT
GOWN STRL REUS W/TWL LRG LVL3 (GOWN DISPOSABLE) ×2
GOWN STRL REUS W/TWL XL LVL3 (GOWN DISPOSABLE) ×2 IMPLANT
IRRIGATION STRYKERFLOW (MISCELLANEOUS) ×1 IMPLANT
IRRIGATOR STRYKERFLOW (MISCELLANEOUS) ×2
IV LACTATED RINGERS 1000ML (IV SOLUTION) ×2 IMPLANT
KIT PINK PAD W/HEAD ARE REST (MISCELLANEOUS) ×2
KIT PINK PAD W/HEAD ARM REST (MISCELLANEOUS) ×1 IMPLANT
KIT RM TURNOVER CYSTO AR (KITS) ×2 IMPLANT
LABEL OR SOLS (LABEL) ×2 IMPLANT
LIQUID BAND (GAUZE/BANDAGES/DRESSINGS) ×2 IMPLANT
NS IRRIG 1000ML POUR BTL (IV SOLUTION) ×2 IMPLANT
NS IRRIG 500ML POUR BTL (IV SOLUTION) ×2 IMPLANT
PACK GYN LAPAROSCOPIC (MISCELLANEOUS) ×2 IMPLANT
PAD OB MATERNITY 4.3X12.25 (PERSONAL CARE ITEMS) ×2 IMPLANT
PAD PREP 24X41 OB/GYN DISP (PERSONAL CARE ITEMS) ×2 IMPLANT
POUCH ENDO CATCH 10MM SPEC (MISCELLANEOUS) ×2 IMPLANT
SCISSORS METZENBAUM CVD 33 (INSTRUMENTS) ×2 IMPLANT
SHEARS HARMONIC ACE PLUS 36CM (ENDOMECHANICALS) ×2 IMPLANT
SLEEVE ENDOPATH XCEL 5M (ENDOMECHANICALS) ×2 IMPLANT
SUT VIC AB 3-0 SH 27 (SUTURE) ×1
SUT VIC AB 3-0 SH 27X BRD (SUTURE) ×1 IMPLANT
SUT VICRYL 0 AB UR-6 (SUTURE) ×2 IMPLANT
TROCAR ENDO BLADELESS 11MM (ENDOMECHANICALS) ×2 IMPLANT
TROCAR XCEL NON-BLD 5MMX100MML (ENDOMECHANICALS) ×2 IMPLANT
TROCAR XCEL UNIV SLVE 11M 100M (ENDOMECHANICALS) ×2 IMPLANT
TUBING INSUFFLATOR HI FLOW (MISCELLANEOUS) ×2 IMPLANT

## 2016-07-17 NOTE — Transfer of Care (Signed)
Immediate Anesthesia Transfer of Care Note  Patient: Jennifer Best  Procedure(s) Performed: Procedure(s) with comments: UNILATERAL SALPINGECTOMY (Left) LAPAROSCOPY DIAGNOSTIC (Left) - removal ectopic pregnancy  Patient Location: PACU  Anesthesia Type:General  Level of Consciousness: awake and confused  Airway & Oxygen Therapy: Patient Spontanous Breathing  Post-op Assessment: Report given to RN and Post -op Vital signs reviewed and stable  Post vital signs: Reviewed and stable  Last Vitals:  Vitals:   07/17/16 1953 07/17/16 2000  BP: 122/82 121/76  Pulse: 76 72  Resp: 18 16  Temp:      Last Pain:  Vitals:   07/17/16 2032  TempSrc:   PainSc: 2          Complications: No apparent anesthesia complications

## 2016-07-17 NOTE — Op Note (Signed)
Procedure(s): LEFT SALPINGECTOMY LAPAROSCOPY Procedure Note  Jennifer Best female 25 y.o. 07/17/2016  Indications: The patient is a 25 y.o. 53P2002 female with ruptured left ectopic pregnancy, s/p failed medical management wit Methotrexate  Pre-operative Diagnosis: Left ruptured ectopic pregnancy, s/p failed medical management with Methotrexate  Post-operative Diagnosis: Same  Surgeon: Hildred LaserAnika Brittony Billick, MD  Assistants: Surgical Scrub Tech  Anesthesia: General endotracheal anesthesia  Findings: The uterus was sounded to 9 cm. Hemoperitoneum present, with ~ 250 ml of blood and large clots.  Left fallopian tube with ruptured ectopic pregnancy.   Right fallopian tube and bilateral  ovaries appeared normal.   Procedure Details: The patient was seen in the Holding Room. The risks, benefits, complications, treatment options, and expected outcomes were discussed with the patient.  The patient concurred with the proposed plan, giving informed consent.  The site of surgery properly noted/marked. The patient was taken to the Operating Room, identified as Jennifer Best and the procedure verified as Procedure(s) (LRB): UNILATERAL SALPINGECTOMY (Left) LAPAROSCOPY.   She was then placed under general anesthesia without difficulty. She was placed in the dorsal lithotomy position, and was prepped and draped in a sterile manner.  A Time Out was held and the above information confirmed. A straight catheterization was performed.  A sterile speculum was inserted into the vagina and the cervix was grasped at the anterior lip using a single-toothed tenaculum.  The uterus was sounded to 9 cm, and a Hulka clamp was placed for uterine manipulation.  The speculum and tenaculum were then removed.  Attention was turned to the abdomen where an umbilical incision was made with the scalpel.  The Optiview 11-mm trocar and sleeve were then advanced without difficulty with the laparoscope under direct visualization into the  abdomen.  The abdomen was then insufflated with carbon dioxide gas and adequate pneumoperitoneum was obtained. A 5-mm right lower quadrant port and an 5-mm suprapubic lower quadrant port were then placed under direct visualization.  All incisions were injected with a total of 8 ml of local anesthetic (0.5% Sensorcaine) prior to insertion of trochars A survey of the patient's pelvis and abdomen revealed the findings as above. A suction irrigator was inserted and the abdomen was cleared of the hemoperitoneum (~ 250 ml of blood and clots). On the left side, the ectopic pregnancy was identified at the left fallopian tube.  The fallopian tube was grasped at the fimbriated end and elevated.  The Harmonic device was then used to transect the entire fallopian tube via the mesosalpinx.    An Endocatch bag was then inserted into the 11 mm trochar and the left tube was removed.    The pneumoperitoneum was then re-established, and the laparoscope was then introduced once again into the abdominal cavity.  A final survey was performed, where good hemostasis was noted on the left side.  Good hemostasis was then achieved.  All trocars were removed under direct visualization, and the abdomen which was desufflated.    All skin incisions were closed with Liquiband. The patient tolerated the procedures well.  All instruments, needles, and sponge counts were correct x 2. The patient was taken to the recovery room awake, extubated and in stable condition.    Estimated Blood Loss:  250 ml hemoperitoneum, minimal surgical blood loss.       Drains: straight catheterization prior to procedure with 10 ml of clear urine         Total IV Fluids:  800 ml of Lactated Ringer's  Specimens: Left fallopian  tube with ectopic pregnancy         Implants: None         Complications:  None; patient tolerated the procedure well.         Disposition: PACU - hemodynamically stable.         Condition: stable   Hildred LaserAnika Jeovanni Heuring,  MD [facility]

## 2016-07-17 NOTE — Anesthesia Procedure Notes (Signed)
Procedure Name: Intubation Date/Time: 07/17/2016 9:03 PM Performed by: Waldo LaineJUSTIS, Kamora Vossler Pre-anesthesia Checklist: Emergency Drugs available, Suction available, Patient identified, Patient being monitored and Timeout performed Patient Re-evaluated:Patient Re-evaluated prior to inductionOxygen Delivery Method: Circle system utilized Preoxygenation: Pre-oxygenation with 100% oxygen Intubation Type: IV induction, Rapid sequence and Cricoid Pressure applied Laryngoscope Size: Miller and 2 Grade View: Grade I Tube type: Oral Number of attempts: 1 Airway Equipment and Method: Stylet Placement Confirmation: ETT inserted through vocal cords under direct vision,  positive ETCO2 and breath sounds checked- equal and bilateral Secured at: 21 cm Tube secured with: Tape Dental Injury: Teeth and Oropharynx as per pre-operative assessment

## 2016-07-17 NOTE — ED Triage Notes (Signed)
Patient arrives via EMS. EMS advised patient was seen at [facility] for abdominal pain 1-2 weeks ago. Per patient [facility] confirmed she had an ectopic pregnancy in her left ovary. She was given Methotrexate to end the pregnancy. Today she experience a sudden onset of left lower abdominal pain and called EMS.

## 2016-07-17 NOTE — ED Provider Notes (Signed)
[facility] Emergency Department Provider Note ____________________________________________   I have reviewed the triage vital signs and the triage nursing note.  HISTORY  Chief Complaint Abdominal Pain   Historian Patient and husband  HPI Jennifer Best is a 25 y.o. female G3 P2 here for llq pain, hx of recentleft ectopic pregnancy treated with methotrexate at [facility] 06/30/16. Onset of left lower quadrant pain severe a few hours ago.  She is also having a slight amount of vaginal bleeding. No dizziness or passing out. No vomiting.  Pain is moderate at this 0.8 out of 10.   Past Medical History:  Diagnosis Date  . Obesity (BMI 30-39.9)     Patient Active Problem List   Diagnosis Date Noted  . Ectopic pregnancy 06/30/2016    Past Surgical History:  Procedure Laterality Date  . CHOLECYSTECTOMY      Prior to Admission medications   Medication Sig Start Date End Date Taking? Authorizing Provider  estrogens conjugated, synthetic A, (CENESTIN) 1.25 MG tablet Take 1.25 mg by mouth daily.    Historical Provider, MD  Prenatal Vit-Fe Fumarate-FA (MULTIVITAMIN-PRENATAL) 27-0.8 MG TABS tablet Take 1 tablet by mouth daily at 12 noon.    Historical Provider, MD  progesterone 50 MG/ML injection Inject 10 mg into the muscle daily.    Historical Provider, MD    Allergies  Allergen Reactions  . Latex   . Morphine And Related     Family History  Problem Relation Age of Onset  . Lung cancer Mother   . Breast cancer Maternal Grandmother   . Lung cancer Paternal Grandmother   . Prostate cancer Paternal Grandfather     Social History Social History  Substance Use Topics  . Smoking status: Former Games developermoker  . Smokeless tobacco: Former NeurosurgeonUser  . Alcohol use No    Review of Systems  Constitutional: Negative for fever. Eyes: Negative for visual changes. ENT: Negative for sore throat. Cardiovascular: Negative for chest pain. Respiratory: Negative for  shortness of breath. Gastrointestinal: Positive for abdominal pain. Genitourinary: Negative for dysuria. Musculoskeletal: Negative for back pain. Skin: Negative for rash. Neurological: Negative for headache. 10 point Review of Systems otherwise negative ____________________________________________   PHYSICAL EXAM:  VITAL SIGNS: ED Triage Vitals  Enc Vitals Group     BP 07/17/16 1722 130/90     Pulse Rate 07/17/16 1722 86     Resp 07/17/16 1722 20     Temp 07/17/16 1722 98.4 F (36.9 C)     Temp Source 07/17/16 1722 Oral     SpO2 07/17/16 1722 99 %     Weight 07/17/16 1727 212 lb (96.2 kg)     Height 07/17/16 1727 5\' 5"  (1.651 m)     Head Circumference --      Peak Flow --      Pain Score 07/17/16 1727 7     Pain Loc --      Pain Edu? --      Excl. in GC? --      Constitutional: Alert and oriented. Well appearing and in no distress. HEENT   Head: Normocephalic and atraumatic.      Eyes: Conjunctivae are normal. PERRL. Normal extraocular movements.      Ears:         Nose: No congestion/rhinnorhea.   Mouth/Throat: Mucous membranes are moist.   Neck: No stridor. Cardiovascular/Chest: Normal rate, regular rhythm.  No murmurs, rubs, or gallops. Respiratory: Normal respiratory effort without tachypnea nor retractions. Breath sounds are  clear and equal bilaterally. No wheezes/rales/rhonchi. Gastrointestinal: Soft. No distention, no guarding, no rebound. Moderate tenderness in the left lower quadrant.  Genitourinary/rectal:Deferred Musculoskeletal: Nontender with normal range of motion in all extremities. No joint effusions.  No lower extremity tenderness.  No edema. Neurologic:  Normal speech and language. No gross or focal neurologic deficits are appreciated. Skin:  Skin is warm, dry and intact. No rash noted. Psychiatric: Mood and affect are normal. Speech and behavior are normal. Patient exhibits appropriate insight and  judgment.  ____________________________________________   EKG I, Governor Rooksebecca Raiford Fetterman, MD, the attending physician have personally viewed and interpreted all ECGs.   none ____________________________________________  LABS (pertinent positives/negatives)  Labs Reviewed  HCG, QUANTITATIVE, PREGNANCY - Abnormal; Notable for the following:       Result Value   hCG, Beta Chain, Quant, S 696 (*)    All other components within normal limits  BASIC METABOLIC PANEL - Abnormal; Notable for the following:    CO2 21 (*)    Glucose, Bld 104 (*)    All other components within normal limits  POCT PREGNANCY, URINE - Abnormal; Notable for the following:    Preg Test, Ur POSITIVE (*)    All other components within normal limits  CBC WITH DIFFERENTIAL/PLATELET  POC URINE PREG, ED  TYPE AND SCREEN    ____________________________________________  RADIOLOGY All Xrays were viewed by me. Imaging interpreted by Radiologist. Discussed with the radiologist   Ultrasound pelvic and transvaginal with Doppler: IMPRESSION: 2.3 cm left adnexal mass adjacent to the left ovary, highly suspicious for ectopic pregnancy. This has increased from the prior outside ultrasound, although this it is unclear whether this reflects true growth versus interval hemorrhage into the lesion. Correlate with outside beta HCG at the time of that ultrasound.  Surrounding sentinel clot along the left ovary, new. Moderately fluid/ hemorrhage within the pelvis, new. These findings at least raise concern for ruptured ectopic.  No evidence of ovarian torsion.  These results were called by telephone at the time of interpretation on 07/17/2016 at 7:10 pm to Dr. Governor RooksEBECCA Qunicy Higinbotham , who verbally acknowledged these results. __________________________________________  PROCEDURES  Procedure(s) performed: None  Critical Care performed: CRITICAL CARE Performed by: Governor RooksLORD, Goldy Calandra   Total critical care time: 30 minutes  Critical care  time was exclusive of separately billable procedures and treating other patients.  Critical care was necessary to treat or prevent imminent or life-threatening deterioration.  Critical care was time spent personally by me on the following activities: development of treatment plan with patient and/or surrogate as well as nursing, discussions with consultants, evaluation of patient's response to treatment, examination of patient, obtaining history from patient or surrogate, ordering and performing treatments and interventions, ordering and review of laboratory studies, ordering and review of radiographic studies, pulse oximetry and re-evaluation of patient's condition.   ____________________________________________   ED COURSE / ASSESSMENT AND PLAN  Pertinent labs & imaging results that were available during my care of the patient were reviewed by me and considered in my medical decision making (see chart for details).   Keep the patient's bedside immediately given a history of recently diagnosed left ectopic pregnancy now with worsening severe left lower quadrant pain. Vitals reviewed in stable with no tachycardia or hypotension. Patient does have a significant amount of pain at the left lower quadrant. Patient was arranged to go to ultrasound immediately for further evaluation. Normal saline bolus initiated. Patient was given IV pain medication for symptomatic relief.  Type and screen sent.  I received a phone call from radiologist discussing evidence of a ruptured ectopic pregnancy. I reviewed this with the patient, and blood pressure remained stable with systolic 126, and no tachycardia.  I spoke with Dr. Valentino Saxon, it turns out the patient somehow did not come back for a second dose of methotrexate, and indicates she will come into the ED immediately to assess and treat this patient, likely surgical evacuation today.    CONSULTATIONS:    Dr. Valentino Saxon, OB/GYN.   Patient / Family / Caregiver  informed of clinical course, medical decision-making process, and agree with plan.    ___________________________________________   FINAL CLINICAL IMPRESSION(S) / ED DIAGNOSES   Final diagnoses:  Ruptured left tubal ectopic pregnancy causing hemoperitoneum              Note: This dictation was prepared with Dragon dictation. Any transcriptional errors that result from this process are unintentional    Governor Rooks, MD 07/17/16 1929

## 2016-07-17 NOTE — Anesthesia Preprocedure Evaluation (Signed)
Anesthesia Evaluation  Patient identified by MRN, date of birth, ID band Patient awake    Reviewed: Allergy & Precautions, H&P , NPO status , Patient's Chart, lab work & pertinent test results, reviewed documented beta blocker date and time   History of Anesthesia Complications Negative for: history of anesthetic complications  Airway Mallampati: II  TM Distance: >3 FB Neck ROM: full    Dental no notable dental hx. (+) Poor Dentition   Pulmonary neg shortness of breath, asthma , neg sleep apnea, neg COPD, neg recent URI, former smoker,    Pulmonary exam normal breath sounds clear to auscultation       Cardiovascular Exercise Tolerance: Good negative cardio ROS Normal cardiovascular exam Rhythm:regular Rate:Normal     Neuro/Psych Seizures - (Absence),  negative psych ROS   GI/Hepatic Neg liver ROS, GERD  ,  Endo/Other  negative endocrine ROS  Renal/GU negative Renal ROS  negative genitourinary   Musculoskeletal   Abdominal   Peds  Hematology negative hematology ROS (+)   Anesthesia Other Findings Past Medical History: No date: Obesity (BMI 30-39.9)   Reproductive/Obstetrics (+) Pregnancy (Ruptured ectopic)                             Anesthesia Physical Anesthesia Plan  ASA: II  Anesthesia Plan: General   Post-op Pain Management:    Induction:   Airway Management Planned:   Additional Equipment:   Intra-op Plan:   Post-operative Plan:   Informed Consent: I have reviewed the patients History and Physical, chart, labs and discussed the procedure including the risks, benefits and alternatives for the proposed anesthesia with the patient or authorized representative who has indicated his/her understanding and acceptance.   Dental Advisory Given  Plan Discussed with: Anesthesiologist, CRNA and Surgeon  Anesthesia Plan Comments:         Anesthesia Quick Evaluation

## 2016-07-17 NOTE — H&P (Signed)
GYNECOLOGY HISTORY AND PHYSICAL   Reason for Consult:Suspected ruptured ectopic pregnancy, s/p methotrexate management Referring Physician: Lisa Roca, MD  Jennifer Best is an 25 y.o. [phone] female  with last menstrual period of  05/04/2016, who presented to the Emergency Room with complaints of worsening left lower abdominal pain. Patient notes that she had been experiencing mild lower abdominal pain all week after receiving Methotrexate therapy for treatment of ectopic pregnancy, however today the pain became significantly worse.  Reports small amount of vaginal bleeding, but denies nausea/vomting, fevers, chills.  Was diagnosed with ectopic pregnancy on 06/30/2016, at 7.[redacted] weeks gestation.  Pregnancy was via IVF as patient is a surrogate.   Pertinent Gynecological History: Menses: regular every month without intermenstrual spotting Contraception: none Blood transfusions: none Sexually transmitted diseases: no past history Previous GYN Procedures: none     OB History  Gravida Para Term Preterm AB Living  3 2 2         SAB TAB Ectopic Multiple Live Births               # Outcome Date GA Lbr Len/2nd Weight Sex Delivery Anes PTL Lv  3 Current           2 Term 2015   7 lb 1.6 oz (3.221 kg) F Vag-Spont     1 Term 2012   6 lb 2.2 oz (2.785 kg) F Vag-Spont         Menstrual History: Menarche age: 65 Patient's last menstrual period was 05/04/2016.    Past Medical History:  Diagnosis Date  . Obesity (BMI 30-39.9)     Past Surgical History:  Procedure Laterality Date  . CHOLECYSTECTOMY      Family History  Problem Relation Age of Onset  . Lung cancer Mother   . Breast cancer Maternal Grandmother   . Lung cancer Paternal Grandmother   . Prostate cancer Paternal Grandfather     Social History:  reports that she has quit smoking. She has quit using smokeless tobacco. She reports that she does not drink alcohol or use drugs.  Allergies:  Allergies  Allergen Reactions  .  Morphine And Related Other (See Comments)    Makes "veins stand up"  . Latex Rash and Other (See Comments)    Watery eyes    Medications: I have reviewed the patient's current medications. No current facility-administered medications on file prior to encounter.    No current outpatient prescriptions on file prior to encounter.    Review of Systems  Constitutional: Negative for chills and fever.  HENT: Negative for congestion, nosebleeds and sore throat.   Eyes: Negative for blurred vision and discharge.  Respiratory: Negative for cough, shortness of breath and wheezing.   Cardiovascular: Negative for chest pain, palpitations and leg swelling.  Gastrointestinal: Positive for abdominal pain. Negative for blood in stool, constipation, diarrhea, heartburn, nausea and vomiting.  Genitourinary: Negative for dysuria, frequency, hematuria and urgency.  Musculoskeletal: Negative for falls, joint pain and myalgias.  Skin: Negative for itching and rash.  Neurological: Negative for dizziness, seizures, loss of consciousness, weakness and headaches.  Endo/Heme/Allergies: Negative for environmental allergies. Does not bruise/bleed easily.  Psychiatric/Behavioral: Negative for depression and substance abuse.    Blood pressure 122/82, pulse 76, temperature 98.4 F (36.9 C), temperature source Oral, resp. rate 18, height 5' 5"  (1.651 m), weight 212 lb (96.2 kg), last menstrual period 05/04/2016, SpO2 100 %. Physical Exam  Constitutional: She is oriented to person, place, and time. She appears well-developed and  well-nourished. No distress.  HENT:  Head: Normocephalic and atraumatic.  Mouth/Throat: Oropharynx is clear and moist.  Eyes: Conjunctivae and EOM are normal. Pupils are equal, round, and reactive to light. Right eye exhibits no discharge. Left eye exhibits no discharge. No scleral icterus.  Neck: Normal range of motion. Neck supple. No JVD present. No tracheal deviation present. No  thyromegaly present.  Cardiovascular: Normal rate and regular rhythm.  Exam reveals no gallop and no friction rub.   No murmur heard. Respiratory: Effort normal and breath sounds normal. No respiratory distress. She has no wheezes.  GI: Soft. Bowel sounds are normal. She exhibits no distension and no mass. There is tenderness. There is no rebound and no guarding.  Tenderness in LLQ, mild  Genitourinary: Rectum normal, vagina normal and uterus normal. Uterus is not enlarged and not tender. Cervix exhibits no motion tenderness and no discharge. Right adnexum displays no mass, no tenderness and no fullness. Left adnexum displays tenderness. Left adnexum displays no mass and no fullness.  Genitourinary Comments: Small amount of dark red blood in vaginal vault.   Musculoskeletal: Normal range of motion.  Neurological: She is alert and oriented to person, place, and time.  Skin: Skin is warm, dry and intact. She is not diaphoretic.  Psychiatric: She has a normal mood and affect. Her speech is normal and behavior is normal.      Results for Jennifer Best (MRN [id number]) as of 07/17/2016 20:49  Ref. Range 06/23/2016 08:08 06/27/2016 11:33 06/30/2016 08:08 07/03/2016 00:00 07/07/2016 00:00 07/17/2016 17:32  hCG Quant Latest Units: mIU/mL 935 1,297 1,607 2,243 1,728 696   Results for orders placed or performed during the hospital encounter of 07/17/16 (from the past 48 hour(s))  Basic metabolic panel     Status: Abnormal   Collection Time: 07/17/16  5:32 PM  Result Value Ref Range   Sodium 139 135 - 145 mmol/L   Potassium 3.9 3.5 - 5.1 mmol/L   Chloride 109 101 - 111 mmol/L   CO2 21 (L) 22 - 32 mmol/L   Glucose, Bld 104 (H) 65 - 99 mg/dL   BUN 6 6 - 20 mg/dL   Creatinine, Ser 0.66 0.44 - 1.00 mg/dL   Calcium 9.3 8.9 - 10.3 mg/dL   GFR calc non Af Amer >60 >60 mL/min   GFR calc Af Amer >60 >60 mL/min    Comment: (NOTE) The eGFR has been calculated using the CKD EPI equation. This calculation has not  been validated in all clinical situations. eGFR's persistently <60 mL/min signify possible Chronic Kidney Disease.    Anion gap 9 5 - 15  CBC with Differential     Status: None   Collection Time: 07/17/16  5:32 PM  Result Value Ref Range   WBC 6.9 3.6 - 11.0 K/uL   RBC 4.57 3.80 - 5.20 MIL/uL   Hemoglobin 14.0 12.0 - 16.0 g/dL   HCT 39.9 35.0 - 47.0 %   MCV 87.3 80.0 - 100.0 fL   MCH 30.7 26.0 - 34.0 pg   MCHC 35.1 32.0 - 36.0 g/dL   RDW 12.9 11.5 - 14.5 %   Platelets 251 150 - 440 K/uL   Neutrophils Relative % 70 %   Neutro Abs 4.8 1.4 - 6.5 K/uL   Lymphocytes Relative 20 %   Lymphs Abs 1.4 1.0 - 3.6 K/uL   Monocytes Relative 6 %   Monocytes Absolute 0.4 0.2 - 0.9 K/uL   Eosinophils Relative 3 %   Eosinophils Absolute  0.2 0 - 0.7 K/uL   Basophils Relative 1 %   Basophils Absolute 0.1 0 - 0.1 K/uL  Pregnancy, urine POC     Status: Abnormal   Collection Time: 07/17/16  5:48 PM  Result Value Ref Range   Preg Test, Ur POSITIVE (A) NEGATIVE    Comment:        THE SENSITIVITY OF THIS METHODOLOGY IS >24 mIU/mL   Type and screen [facility]     Status: None   Collection Time: 07/17/16  6:41 PM  Result Value Ref Range   ABO/RH(D) O POS    Antibody Screen NEG    Sample Expiration 07/20/2016     US Ob Comp Less 14 Wks  Result Date: 07/17/2016 CLINICAL DATA:  Ectopic pregnancy status post methotrexate, left adnexal pain, beta HCG 696 (previously 935 on 06/23/2016) EXAM: OBSTETRIC <14 WK [place] AND TRANSVAGINAL OB US DOPPLER ULTRASOUND OF OVARIES TECHNIQUE: Both transabdominal and transvaginal ultrasound examinations were performed for complete evaluation of the gestation as well as the maternal uterus, adnexal regions, and pelvic cul-de-sac. Transvaginal technique was performed to assess early pregnancy. Color and duplex Doppler ultrasound was utilized to evaluate blood flow to the ovaries. COMPARISON:  Outside ultrasound dated 06/30/2016 FINDINGS: Intrauterine  gestational sac: None Maternal uterus/adnexae: Right ovaries within normal limits, measuring 1.6 x 2.5 x 1.6 cm. Left ovary measures 2.2 x 1.0 x 2.2 cm. Adjacent 2.3 x 1.8 x 2.0 cm left adnexal mass (image 76), separate and posterior to the left ovary, highly suspicious for ectopic pregnancy. This previously measured 0.9 x 1.1 x 1.1 cm. Additional heterogeneous/hyperechoic soft tissue is suspected along the left ovary/adnexa (image 85), suspicious for clot. Overall, the ovary, suspected ectopic, and clot measures 5.7 x 2.8 x 3.6 cm. Moderate free fluid/ hemorrhage within the pelvis. Pulsed Doppler evaluation of both ovaries demonstrates normal appearing low-resistance arterial and venous waveforms. IMPRESSION: 2.3 cm left adnexal mass adjacent to the left ovary, highly suspicious for ectopic pregnancy. This has increased from the prior outside ultrasound, although this it is unclear whether this reflects true growth versus interval hemorrhage into the lesion. Correlate with outside beta HCG at the time of that ultrasound. Surrounding sentinel clot along the left ovary, new. Moderately fluid/ hemorrhage within the pelvis, new. These findings at least raise concern for ruptured ectopic. No evidence of ovarian torsion. These results were called by telephone at the time of interpretation on 07/17/2016 at 7:10 pm to Dr. Lisa Best , who verbally acknowledged these results. Electronically Signed   By: Julian Hy M.D.   On: 07/17/2016 19:13   US Ob Transvaginal  Result Date: 07/17/2016 CLINICAL DATA:  Ectopic pregnancy status post methotrexate, left adnexal pain, beta HCG 696 (previously 935 on 06/23/2016) EXAM: OBSTETRIC <14 WK [place] AND TRANSVAGINAL OB US DOPPLER ULTRASOUND OF OVARIES TECHNIQUE: Both transabdominal and transvaginal ultrasound examinations were performed for complete evaluation of the gestation as well as the maternal uterus, adnexal regions, and pelvic cul-de-sac. Transvaginal technique was  performed to assess early pregnancy. Color and duplex Doppler ultrasound was utilized to evaluate blood flow to the ovaries. COMPARISON:  Outside ultrasound dated 06/30/2016 FINDINGS: Intrauterine gestational sac: None Maternal uterus/adnexae: Right ovaries within normal limits, measuring 1.6 x 2.5 x 1.6 cm. Left ovary measures 2.2 x 1.0 x 2.2 cm. Adjacent 2.3 x 1.8 x 2.0 cm left adnexal mass (image 76), separate and posterior to the left ovary, highly suspicious for ectopic pregnancy. This previously measured 0.9 x 1.1 x  1.1 cm. Additional heterogeneous/hyperechoic soft tissue is suspected along the left ovary/adnexa (image 85), suspicious for clot. Overall, the ovary, suspected ectopic, and clot measures 5.7 x 2.8 x 3.6 cm. Moderate free fluid/ hemorrhage within the pelvis. Pulsed Doppler evaluation of both ovaries demonstrates normal appearing low-resistance arterial and venous waveforms. IMPRESSION: 2.3 cm left adnexal mass adjacent to the left ovary, highly suspicious for ectopic pregnancy. This has increased from the prior outside ultrasound, although this it is unclear whether this reflects true growth versus interval hemorrhage into the lesion. Correlate with outside beta HCG at the time of that ultrasound. Surrounding sentinel clot along the left ovary, new. Moderately fluid/ hemorrhage within the pelvis, new. These findings at least raise concern for ruptured ectopic. No evidence of ovarian torsion. These results were called by telephone at the time of interpretation on 07/17/2016 at 7:10 pm to Dr. Lisa Best , who verbally acknowledged these results. Electronically Signed   By: Julian Hy M.D.   On: 07/17/2016 19:13   [place] Art/ven Flow Abd Pelv Doppler  Result Date: 07/17/2016 CLINICAL DATA:  Ectopic pregnancy status post methotrexate, left adnexal pain, beta HCG 696 (previously 935 on 06/23/2016) EXAM: OBSTETRIC <14 WK [place] AND TRANSVAGINAL OB US DOPPLER ULTRASOUND OF OVARIES TECHNIQUE: Both  transabdominal and transvaginal ultrasound examinations were performed for complete evaluation of the gestation as well as the maternal uterus, adnexal regions, and pelvic cul-de-sac. Transvaginal technique was performed to assess early pregnancy. Color and duplex Doppler ultrasound was utilized to evaluate blood flow to the ovaries. COMPARISON:  Outside ultrasound dated 06/30/2016 FINDINGS: Intrauterine gestational sac: None Maternal uterus/adnexae: Right ovaries within normal limits, measuring 1.6 x 2.5 x 1.6 cm. Left ovary measures 2.2 x 1.0 x 2.2 cm. Adjacent 2.3 x 1.8 x 2.0 cm left adnexal mass (image 76), separate and posterior to the left ovary, highly suspicious for ectopic pregnancy. This previously measured 0.9 x 1.1 x 1.1 cm. Additional heterogeneous/hyperechoic soft tissue is suspected along the left ovary/adnexa (image 85), suspicious for clot. Overall, the ovary, suspected ectopic, and clot measures 5.7 x 2.8 x 3.6 cm. Moderate free fluid/ hemorrhage within the pelvis. Pulsed Doppler evaluation of both ovaries demonstrates normal appearing low-resistance arterial and venous waveforms. IMPRESSION: 2.3 cm left adnexal mass adjacent to the left ovary, highly suspicious for ectopic pregnancy. This has increased from the prior outside ultrasound, although this it is unclear whether this reflects true growth versus interval hemorrhage into the lesion. Correlate with outside beta HCG at the time of that ultrasound. Surrounding sentinel clot along the left ovary, new. Moderately fluid/ hemorrhage within the pelvis, new. These findings at least raise concern for ruptured ectopic. No evidence of ovarian torsion. These results were called by telephone at the time of interpretation on 07/17/2016 at 7:10 pm to Dr. Lisa Best , who verbally acknowledged these results. Electronically Signed   By: Julian Hy M.D.   On: 07/17/2016 19:13    Assessment/Plan: 1) Suspected rupture left ectopic pregnancy, s/p  failed methotrexate medical management.  - Discussion had on management option of surgical intervention with left salpingectomy (laparoscopic).  The risks of surgery were discussed in detail with the patient including but not limited to: bleeding which may require transfusion or reoperation; infection which may require prolonged hospitalization or re-hospitalization and antibiotic therapy; injury to bowel, bladder, ureters and major vessels or other surrounding organs; need for additional procedures including laparotomy; thromboembolic phenomenon, incisional problems and other postoperative or anesthesia complications.  Patient was told that the  likelihood that her condition and symptoms will be treated effectively with this surgical management was very high; the postoperative expectations were also discussed in detail. All questions were answered.  Patient last ate at 1:00 p.m. today.    Ok to proceed to the OR when ready.  Surgery consent and blood consent forms signed.     Rubie Maid, MD [facility]

## 2016-07-17 NOTE — ED Notes (Addendum)
Pos preg test per poc urine , US called to notify and transport pt

## 2016-07-18 ENCOUNTER — Other Ambulatory Visit: Payer: 59

## 2016-07-18 ENCOUNTER — Encounter: Payer: Self-pay | Admitting: Obstetrics and Gynecology

## 2016-07-18 LAB — CBC
HEMATOCRIT: 37 % (ref 35.0–47.0)
Hemoglobin: 13.2 g/dL (ref 12.0–16.0)
MCH: 31.2 pg (ref 26.0–34.0)
MCHC: 35.7 g/dL (ref 32.0–36.0)
MCV: 87.5 fL (ref 80.0–100.0)
PLATELETS: 263 10*3/uL (ref 150–440)
RBC: 4.23 MIL/uL (ref 3.80–5.20)
RDW: 12.6 % (ref 11.5–14.5)
WBC: 8.1 10*3/uL (ref 3.6–11.0)

## 2016-07-18 MED ORDER — SIMETHICONE 80 MG PO CHEW: 80.0000 mg | CHEWABLE_TABLET | Freq: Four times a day (QID) | ORAL | 0 refills | Status: DC | PRN

## 2016-07-18 MED ORDER — IBUPROFEN 800 MG PO TABS: 800.0000 mg | ORAL_TABLET | Freq: Three times a day (TID) | ORAL | 1 refills | Status: DC | PRN

## 2016-07-18 MED ORDER — OXYCODONE-ACETAMINOPHEN 5-325 MG PO TABS: 1.0000 | ORAL_TABLET | Freq: Four times a day (QID) | ORAL | 0 refills | Status: DC | PRN

## 2016-07-18 MED ORDER — DOCUSATE SODIUM 100 MG PO CAPS: 100.0000 mg | ORAL_CAPSULE | Freq: Two times a day (BID) | ORAL | 2 refills | Status: DC

## 2016-07-18 NOTE — Discharge Instructions (Signed)
General Gynecological Post-Operative Instructions °You may expect to feel dizzy, weak, and drowsy for as long as 24 hours after receiving the medicine that made you sleep (anesthetic).  °Do not drive a car, ride a bicycle, participate in physical activities, or take public transportation until you are done taking narcotic pain medicines or as directed by your doctor.  °Do not drink alcohol or take tranquilizers.  °Do not take medicine that has not been prescribed by your doctor.  °Do not sign important papers or make important decisions while on narcotic pain medicines.  °Have a responsible person with you.  °CARE OF INCISION  °Keep incision clean and dry. °Take showers instead of baths until your doctor gives you permission to take baths.  °Avoid heavy lifting (more than 10 pounds/4.5 kilograms), pushing, or pulling.  °Avoid activities that may risk injury to your surgical site.  °No sexual intercourse or placement of anything in the vagina for 2 weeks or as instructed by your doctor. °Only take prescription or over-the-counter medicines  for pain, discomfort, or fever as directed by your doctor. Do not take aspirin. It can make you bleed. Take medicines (antibiotics) that kill germs if they are prescribed for you.  °Call the office or go to the Emergency Room if:  °You feel sick to your stomach (nauseous).  °You start to throw up (vomit).  °You have trouble eating or drinking.  °You have an oral temperature above 101.  °You have constipation that is not helped by adjusting diet or increasing fluid intake. Pain medicines are a common cause of constipation.  °You have any other concerns. °SEEK IMMEDIATE MEDICAL CARE IF:  °You have persistent dizziness.  °You have difficulty breathing or a congested sounding (croupy) cough.  °You have an oral temperature above 102.5, not controlled by medicine.  °There is increasing pain or tenderness near or in the surgical site.  ° ° °

## 2016-07-18 NOTE — Discharge Summary (Signed)
Gynecology Physician Postoperative Discharge Summary  Patient ID: Linard MillersBrittni Sachdeva MRN: [id number] DOB/AGE: [birth year] 25 y.o.  Admit Date: 07/17/2016 Discharge Date: 07/18/2016  Preoperative Diagnoses: Ruptured left ectopic pregnancy with hemoperitoneum  Procedures: Procedure(s) (LRB): UNILATERAL SALPINGECTOMY (Left) LAPAROSCOPY DIAGNOSTIC (Left)  Hospital Course:  Miasia Flam is a 25 y.o. [id number]  admitted from the Emergency Room for emergency surgery.  She underwent the procedures as mentioned above, her operation was uncomplicated. For further details about surgery, please refer to the operative report. Patient had an uncomplicated postoperative course. By time of discharge on POD#1, her pain was controlled on oral pain medications; she was ambulating, voiding without difficulty, tolerating regular diet and passing flatus. She was deemed stable for discharge to home.   Significant Labs: CBC Latest Ref Rng & Units 07/18/2016 07/17/2016 06/23/2016  WBC 3.6 - 11.0 K/uL 8.1 6.9 10.9  Hemoglobin 12.0 - 16.0 g/dL 95.613.2 21.314.0 08.615.0  Hematocrit 35.0 - 47.0 % 37.0 39.9 42.8  Platelets 150 - 440 K/uL 263 251 310    Discharge Exam: Blood pressure 123/76, pulse 100, temperature 98.5 F (36.9 C), temperature source Oral, resp. rate 18, height 5\' 5"  (1.651 m), weight 212 lb (96.2 kg), last menstrual period 05/04/2016, SpO2 96 %. General appearance: alert and no distress  Resp: clear to auscultation bilaterally  Cardio: regular rate and rhythm  GI: soft, non-tender; bowel sounds normal; no masses, no organomegaly.  Incision: C/D/I, no erythema, no drainage noted Pelvic: scant blood on pad  Extremities: extremities normal, atraumatic, no cyanosis or edema and Homans sign is negative, no sign of DVT  Discharged Condition: Stable  Disposition: 01-Home or Self Care     Medication List    TAKE these medications   docusate sodium 100 MG capsule Commonly known as:  COLACE Take 1 capsule (100 mg  total) by mouth 2 (two) times daily.   ibuprofen 800 MG tablet Commonly known as:  ADVIL,MOTRIN Take 1 tablet (800 mg total) by mouth every 8 (eight) hours as needed.   oxyCODONE-acetaminophen 5-325 MG tablet Commonly known as:  PERCOCET/ROXICET Take 1-2 tablets by mouth every 6 (six) hours as needed for severe pain (moderate to severe pain (when tolerating fluids)).   simethicone 80 MG chewable tablet Commonly known as:  MYLICON Chew 1 tablet (80 mg total) by mouth 4 (four) times daily as needed for flatulence (bloating).        Signed:  Hildred LaserAnika Pratham Cassatt, MD [facility]

## 2016-07-18 NOTE — Progress Notes (Signed)
All discharge instructions given to patient and she voices understanding of all instructions given. Iv d/c'd pt tolerated well. Prescriptions given.

## 2016-07-18 NOTE — Anesthesia Postprocedure Evaluation (Signed)
Anesthesia Post Note  Patient: Rise Critcher  Procedure(s) Performed: Procedure(s) (LRB): UNILATERAL SALPINGECTOMY (Left) LAPAROSCOPY DIAGNOSTIC (Left)  Patient location during evaluation: PACU Anesthesia Type: General Level of consciousness: awake and alert Pain management: pain level controlled Vital Signs Assessment: post-procedure vital signs reviewed and stable Respiratory status: spontaneous breathing, nonlabored ventilation, respiratory function stable and patient connected to nasal cannula oxygen Cardiovascular status: blood pressure returned to baseline and stable Postop Assessment: no signs of nausea or vomiting Anesthetic complications: no    Last Vitals:  Vitals:   07/17/16 2308 07/18/16 0004  BP: (!) 103/53 (!) 116/58  Pulse: 74 88  Resp: 18 18  Temp: 36.6 C 36.5 C    Last Pain:  Vitals:   07/18/16 0004  TempSrc: Oral  PainSc:                  Lenard SimmerAndrew Marycarmen Hagey

## 2016-07-18 NOTE — Progress Notes (Addendum)
1 Day Post-Op Procedure(s) (LRB): UNILATERAL SALPINGECTOMY (Left) LAPAROSCOPY DIAGNOSTIC (Left)  Subjective: Patient reports tolerating PO, + flatus and no problems voiding.  States she is feeling much better.   Objective: I have reviewed patient's vital signs, intake and output, medications and labs.  Temp:  [97.2 F (36.2 C)-98.7 F (37.1 C)] 98.7 F (37.1 C) (08/22 0832) Pulse Rate:  [72-111] 77 (08/22 0832) Resp:  [10-24] 20 (08/22 0832) BP: (103-137)/(52-91) 124/91 (08/22 0832) SpO2:  [92 %-100 %] 96 % (08/22 0318) Weight:  [212 lb (96.2 kg)] 212 lb (96.2 kg) (08/21 1727)  General: alert and no distress Resp: clear to auscultation bilaterally Cardio: regular rate and rhythm, S1, S2 normal, no murmur, click, rub or gallop GI: soft, non-tender; bowel sounds normal; no masses,  no organomegaly.  Laparoscopic incision sites c/d/i with Liquiband Extremities: extremities normal, atraumatic, no cyanosis or edema Vaginal Bleeding: minimal    Labs: CBC Latest Ref Rng & Units 07/18/2016 07/17/2016 06/23/2016  WBC 3.6 - 11.0 K/uL 8.1 6.9 10.9  Hemoglobin 12.0 - 16.0 g/dL 81.113.2 91.414.0 78.215.0  Hematocrit 35.0 - 47.0 % 37.0 39.9 42.8  Platelets 150 - 440 K/uL 263 251 310     Assessment: s/p Procedure(s) with comments: UNILATERAL SALPINGECTOMY (Left) LAPAROSCOPY DIAGNOSTIC (Left) - removal ectopic pregnancy: stable, progressing well and tolerating diet  Plan: Encourage ambulation Discharge home later this evening.  Continue PO pain medications and regular diet.     LOS: 0 days    Hildred Lasernika Kaytlyn Din 07/18/2016, 8:07 AM

## 2016-07-19 LAB — SURGICAL PATHOLOGY

## 2016-08-01 ENCOUNTER — Ambulatory Visit (INDEPENDENT_AMBULATORY_CARE_PROVIDER_SITE_OTHER): Payer: 59 | Admitting: Obstetrics and Gynecology

## 2016-08-01 ENCOUNTER — Other Ambulatory Visit: Payer: Self-pay | Admitting: Obstetrics and Gynecology

## 2016-08-01 ENCOUNTER — Encounter: Payer: Self-pay | Admitting: Obstetrics and Gynecology

## 2016-08-01 VITALS — BP 124/84 | HR 93 | Ht 65.5 in | Wt 221.2 lb

## 2016-08-01 DIAGNOSIS — O008 Other ectopic pregnancy without intrauterine pregnancy: Secondary | ICD-10-CM

## 2016-08-01 DIAGNOSIS — Z4889 Encounter for other specified surgical aftercare: Secondary | ICD-10-CM

## 2016-08-01 DIAGNOSIS — Z9889 Other specified postprocedural states: Secondary | ICD-10-CM

## 2016-08-01 DIAGNOSIS — O009 Unspecified ectopic pregnancy without intrauterine pregnancy: Secondary | ICD-10-CM

## 2016-08-01 NOTE — Progress Notes (Signed)
   GYNECOLOGY POST-OPERATIVE NOTE  Subjective:     Jennifer Best is a 25 y.o. [phone]  female who presents to the clinic 2 weeks status post laparoscopic left salpingectomy for ruptured left ectopic pregnancy s/p failed methotrexate therapy. Eating a regular diet without difficulty. Bowel movements are normal. The patient is not having any pain.  The following portions of the patient's history were reviewed and updated as appropriate: allergies, current medications, past family history, past medical history, past social history, past surgical history and problem list.  Review of Systems A comprehensive review of systems was negative.    Objective:    BP 124/84 (BP Location: Left Arm, Patient Position: Sitting, Cuff Size: Large)   Pulse 93   Ht 5' 5.5" (1.664 m)   Wt 221 lb 3.2 oz (100.3 kg)   LMP 05/04/2016   Breastfeeding? No   BMI 36.25 kg/m  General:  alert and no distress  Abdomen: soft, bowel sounds active, non-tender  Incision:   healing well, no drainage, no erythema, no hernia, no seroma, no swelling, no dehiscence, incisionswell approximated (except suprapubic site with 0.5 mm skin edge un-approximated.        Pathology 07/17/2016:  LEFT ECTOPIC; LEFT SALPINGECTOMY:  - FALLOPIAN TUBE WITH HEMORRHAGE, LUMINAL AND MURAL DECIDUA ANDCHORIONIC VILLI, CONSISTENT WITH ECTOPIC GESTATION.    Assessment:    Doing well postoperatively.  S/p ruptured left ectopic pregnancy with left salpingectomy.    Plan:   1. Continue any current medications as needed. 2. Wound care discussed. Steri-strip laced on suprapubic incision site.  3. Activity restrictions: none.  Can return to normal duties.  4. Operative findings again reviewed. Pathology report discussed.  5. Discussion had on not attempting to conceive again for at least 3 months to allow body time to recover from surgery and prior pregnancy.  Patient plans to use condoms for contraception.  Will draw BHCG today, and follow until  negative.  5. Follow up: 1-2 weeks for next blood draw.      Hildred LaserAnika Emerick Weatherly, MD [facility]

## 2016-08-02 LAB — BETA HCG QUANT (REF LAB)

## 2016-09-18 ENCOUNTER — Telehealth: Payer: Self-pay | Admitting: Obstetrics and Gynecology

## 2016-09-18 NOTE — Telephone Encounter (Signed)
PT CALLED AND SHE WANTED TO SET APPT UP, THIS IS THE PT THAT WE RECEIVED ORDERS FROM THE FERTILITY CLINIC IN ORDER FOR HER TO COME HERE AND HAVE HER CARE, LET ME KNOW WHAT I NEED TO DO.

## 2016-09-18 NOTE — Telephone Encounter (Signed)
Dr.Cherry, Please see me regarding this message. The infertility company wants us to handle this pt's labs and ultrasounds, they have faxed over orders but I do believe there is going to be a billing issue.

## 2016-11-23 ENCOUNTER — Ambulatory Visit (INDEPENDENT_AMBULATORY_CARE_PROVIDER_SITE_OTHER): Payer: 59 | Admitting: Obstetrics and Gynecology

## 2016-11-23 ENCOUNTER — Encounter: Payer: Self-pay | Admitting: Obstetrics and Gynecology

## 2016-11-23 ENCOUNTER — Ambulatory Visit (INDEPENDENT_AMBULATORY_CARE_PROVIDER_SITE_OTHER): Payer: 59

## 2016-11-23 VITALS — BP 130/80 | HR 81 | Ht 65.5 in | Wt 220.7 lb

## 2016-11-23 DIAGNOSIS — Z3481 Encounter for supervision of other normal pregnancy, first trimester: Secondary | ICD-10-CM

## 2016-11-23 DIAGNOSIS — O3680X0 Pregnancy with inconclusive fetal viability, not applicable or unspecified: Secondary | ICD-10-CM

## 2016-11-23 DIAGNOSIS — R638 Other symptoms and signs concerning food and fluid intake: Secondary | ICD-10-CM

## 2016-11-23 DIAGNOSIS — Z3687 Encounter for antenatal screening for uncertain dates: Secondary | ICD-10-CM | POA: Diagnosis not present

## 2016-11-23 DIAGNOSIS — Z87898 Personal history of other specified conditions: Secondary | ICD-10-CM

## 2016-11-23 DIAGNOSIS — O09811 Supervision of pregnancy resulting from assisted reproductive technology, first trimester: Secondary | ICD-10-CM

## 2016-11-23 DIAGNOSIS — Z202 Contact with and (suspected) exposure to infections with a predominantly sexual mode of transmission: Secondary | ICD-10-CM

## 2016-11-23 NOTE — Progress Notes (Signed)
I have reviewed the record and concur with patient management and plan.  Patient with h/o recent ectopic pregnancy (from in vitro pregnancy), now currently pregnant again via in vitro.  Will need dating scan and BHCG ASAP.   Jennifer LaserAnika Jaielle Dlouhy, MD [facility]

## 2016-11-23 NOTE — Patient Instructions (Signed)
Minor Illnesses and Medications in Pregnancy  Cold/Flu:  Sudafed for congestion- Robitussin (plain) for cough- Tylenol for discomfort.  Please follow the directions on the label.  Try not to take any more than needed.  OTC Saline nasal spray and air humidifier or cool-mist  Vaporizer to sooth nasal irritation and to loosen congestion.  It is also important to increase intake of non carbonated fluids, especially if you have a fever.  Constipation:  Colace-2 capsules at bedtime; Metamucil- follow directions on label; Senokot- 1 tablet at bedtime.  Any one of these medications can be used.  It is also very important to increase fluids and fruits along with regular exercise.  If problem persists please call the office.  Diarrhea:  Kaopectate as directed on the label.  Eat a bland diet and increase fluids.  Avoid highly seasoned foods.  Headache:  Tylenol 1 or 2 tablets every 3-4 hours as needed  Indigestion:  Maalox, Mylanta, Tums or Rolaids- as directed on label.  Also try to eat small meals and avoid fatty, greasy or spicy foods.  Nausea with or without Vomiting:  Nausea in pregnancy is caused by increased levels of hormones in the body which influence the digestive system and cause irritation when stomach acids accumulate.  Symptoms usually subside after 1st trimester of pregnancy.  Try the following: 1. Keep saltines, graham crackers or dry toast by your bed to eat upon awakening. 2. Don't let your stomach get empty.  Try to eat 5-6 small meals per day instead of 3 large ones. 3. Avoid greasy fatty or highly seasoned foods.  4. Take OTC Unisom 1 tablet at bed time along with OTC Vitamin B6 25-50 mg 3 times per day.    If nausea continues with vomiting and you are unable to keep down food and fluids you may need a prescription medication.  Please notify your provider.   Sore throat:  Chloraseptic spray, throat lozenges and or plain Tylenol.  Vaginal Yeast Infection:  OTC Monistat for 7 days as  directed on label.  If symptoms do not resolve within a week notify provider.  If any of the above problems do not subside with recommended treatment please call the office for further assistance.   Do not take Aspirin, Advil, Motrin or Ibuprofen.  * * OTC= Over the counter Pregnancy and Zika Virus Disease Introduction Zika virus disease, or Zika, is an illness that can spread to people from mosquitoes that carry the virus. It may also spread from person to person through infected body fluids. Zika first occurred in [place], but recently it has spread to new areas. The virus occurs in tropical climates. The location of Zika continues to change. Most people who become infected with Zika virus do not develop serious illness. However, Zika may cause birth defects in an unborn baby whose mother is infected with the virus. It may also increase the risk of miscarriage. What are the symptoms of Zika virus disease? In many cases, people who have been infected with Zika virus do not develop any symptoms. If symptoms appear, they usually start about a week after the person is infected. Symptoms are usually mild. They may include:  Fever.  Rash.  Red eyes.  Joint pain. How does Zika virus disease spread? The main way that Zika virus spreads is through the bite of a certain type of mosquito. Unlike most types of mosquitos, which bite only at night, the type of mosquito that carries Zika virus bites both at night  and during the day. Zika virus can also spread through sexual contact, through a blood transfusion, and from a mother to her baby before or during birth. Once you have had Zika virus disease, it is unlikely that you will get it again. Can I pass Zika to my baby during pregnancy? Yes, Zika can pass from a mother to her baby before or during birth. What problems can Zika cause for my baby? A woman who is infected with Zika virus while pregnant is at risk of having her baby born with a condition in  which the brain or head is smaller than expected (microcephaly). Babies who have microcephaly can have developmental delays, seizures, hearing problems, and vision problems. Having Zika virus disease during pregnancy can also increase the risk of miscarriage. How can Zika virus disease be prevented? There is no vaccine to prevent Zika. The best way to prevent the disease is to avoid infected mosquitoes and avoid exposure to body fluids that can spread the virus. Avoid any possible exposure to Zika by taking the following precautions. For women and their sex partners:  Avoid traveling to high-risk areas. The locations where [place] is being reported change often. To identify high-risk areas, check the CDC travel website: http://davidson-gomez.com/www.cdc.gov/zika/geo/index.html  If you or your sex partner must travel to a high-risk area, talk with a health care provider before and after traveling.  Take all precautions to avoid mosquito bites if you live in, or travel to, any of the high-risk areas. Insect repellents are safe to use during pregnancy.  Ask your health care provider when it is safe to have sexual contact. For women:  If you are pregnant or trying to become pregnant, avoid sexual contact with persons who may have been exposed to [place] virus, persons who have possible symptoms of Zika, or persons whose history you are unsure about. If you choose to have sexual contact with someone who may have been exposed to [place] virus, use condoms correctly during the entire duration of sexual activity, every time. Do not share sexual devices, as you may be exposed to body fluids.  Ask your health care provider about when it is safe to attempt pregnancy after a possible exposure to Zika virus. What steps should I take to avoid mosquito bites? Take these steps to avoid mosquito bites when you are in a high-risk area:  Wear loose clothing that covers your arms and legs.  Limit your outdoor activities.  Do not open windows  unless they have window screens.  Sleep under mosquito nets.  Use insect repellent. The best insect repellents have:  DEET, picaridin, oil of lemon eucalyptus (OLE), or [id number] in them.  Higher amounts of an active ingredient in them.  Remember that insect repellents are safe to use during pregnancy.  Do not use OLE on children who are younger than 533 years of age. Do not use insect repellent on babies who are younger than 152 months of age.  Cover your child's stroller with mosquito netting. Make sure the netting fits snugly and that any loose netting does not cover your child's mouth or nose. Do not use a blanket as a mosquito-protection cover.  Do not apply insect repellent underneath clothing.  If you are using sunscreen, apply the sunscreen before applying the insect repellent.  Treat clothing with permethrin. Do not apply permethrin directly to your skin. Follow label directions for safe use.  Get rid of standing water, where mosquitoes may reproduce. Standing water is often found in items  such as buckets, bowls, animal food dishes, and flowerpots. When you return from traveling to any high-risk area, continue taking actions to protect yourself against mosquito bites for 3 weeks, even if you show no signs of illness. This will prevent spreading Zika virus to uninfected mosquitoes. What should I know about the sexual transmission of Zika? People can spread Zika to their sexual partners during vaginal, anal, or oral sex, or by sharing sexual devices. Many people with [place] do not develop symptoms, so a person could spread the disease without knowing that they are infected. The greatest risk is to women who are pregnant or who may become pregnant. Zika virus can live longer in semen than it can live in blood. Couples can prevent sexual transmission of the virus by:  Using condoms correctly during the entire duration of sexual activity, every time. This includes vaginal, anal, and oral  sex.  Not sharing sexual devices. Sharing increases your risk of being exposed to body fluid from another person.  Avoiding all sexual activity until your health care provider says it is safe. Should I be tested for Zika virus? A sample of your blood can be tested for Zika virus. A pregnant woman should be tested if she may have been exposed to the virus or if she has symptoms of Zika. She may also have additional tests done during her pregnancy, such ultrasound testing. Talk with your health care provider about which tests are recommended. This information is not intended to replace advice given to you by your health care provider. Make sure you discuss any questions you have with your health care provider. Document Released: 08/04/2015 Document Revised: 04/20/2016 Document Reviewed: 07/28/2015  2017 Elsevier First Trimester of Pregnancy The first trimester of pregnancy is from week 1 until the end of week 12 (months 1 through 3). A week after a sperm fertilizes an egg, the egg will implant on the wall of the uterus. This embryo will begin to develop into a baby. Genes from you and your partner are forming the baby. The female genes determine whether the baby is a boy or a girl. At 6-8 weeks, the eyes and face are formed, and the heartbeat can be seen on ultrasound. At the end of 12 weeks, all the baby's organs are formed.  Now that you are pregnant, you will want to do everything you can to have a healthy baby. Two of the most important things are to get good prenatal care and to follow your health care provider's instructions. Prenatal care is all the medical care you receive before the baby's birth. This care will help prevent, find, and treat any problems during the pregnancy and childbirth. BODY CHANGES Your body goes through many changes during pregnancy. The changes vary from woman to woman.   You may gain or lose a couple of pounds at first.  You may feel sick to your stomach (nauseous) and  throw up (vomit). If the vomiting is uncontrollable, call your health care provider.  You may tire easily.  You may develop headaches that can be relieved by medicines approved by your health care provider.  You may urinate more often. Painful urination may mean you have a bladder infection.  You may develop heartburn as a result of your pregnancy.  You may develop constipation because certain hormones are causing the muscles that push waste through your intestines to slow down.  You may develop hemorrhoids or swollen, bulging veins (varicose veins).  Your breasts may begin to  grow larger and become tender. Your nipples may stick out more, and the tissue that surrounds them (areola) may become darker.  Your gums may bleed and may be sensitive to brushing and flossing.  Dark spots or blotches (chloasma, mask of pregnancy) may develop on your face. This will likely fade after the baby is born.  Your menstrual periods will stop.  You may have a loss of appetite.  You may develop cravings for certain kinds of food.  You may have changes in your emotions from day to day, such as being excited to be pregnant or being concerned that something may go wrong with the pregnancy and baby.  You may have more vivid and strange dreams.  You may have changes in your hair. These can include thickening of your hair, rapid growth, and changes in texture. Some women also have hair loss during or after pregnancy, or hair that feels dry or thin. Your hair will most likely return to normal after your baby is born. WHAT TO EXPECT AT YOUR PRENATAL VISITS During a routine prenatal visit:  You will be weighed to make sure you and the baby are growing normally.  Your blood pressure will be taken.  Your abdomen will be measured to track your baby's growth.  The fetal heartbeat will be listened to starting around week 10 or 12 of your pregnancy.  Test results from any previous visits will be  discussed. Your health care provider may ask you:  How you are feeling.  If you are feeling the baby move.  If you have had any abnormal symptoms, such as leaking fluid, bleeding, severe headaches, or abdominal cramping.  If you are using any tobacco products, including cigarettes, chewing tobacco, and electronic cigarettes.  If you have any questions. Other tests that may be performed during your first trimester include:  Blood tests to find your blood type and to check for the presence of any previous infections. They will also be used to check for low iron levels (anemia) and Rh antibodies. Later in the pregnancy, blood tests for diabetes will be done along with other tests if problems develop.  Urine tests to check for infections, diabetes, or protein in the urine.  An ultrasound to confirm the proper growth and development of the baby.  An amniocentesis to check for possible genetic problems.  Fetal screens for spina bifida and Down syndrome.  You may need other tests to make sure you and the baby are doing well.  HIV (human immunodeficiency virus) testing. Routine prenatal testing includes screening for HIV, unless you choose not to have this test. HOME CARE INSTRUCTIONS  Medicines   Follow your health care provider's instructions regarding medicine use. Specific medicines may be either safe or unsafe to take during pregnancy.  Take your prenatal vitamins as directed.  If you develop constipation, try taking a stool softener if your health care provider approves. Diet   Eat regular, well-balanced meals. Choose a variety of foods, such as meat or vegetable-based protein, fish, milk and low-fat dairy products, vegetables, fruits, and whole grain breads and cereals. Your health care provider will help you determine the amount of weight gain that is right for you.  Avoid raw meat and uncooked cheese. These carry germs that can cause birth defects in the baby.  Eating four  or five small meals rather than three large meals a day may help relieve nausea and vomiting. If you start to feel nauseous, eating a few soda crackers can  be helpful. Drinking liquids between meals instead of during meals also seems to help nausea and vomiting.  If you develop constipation, eat more high-fiber foods, such as fresh vegetables or fruit and whole grains. Drink enough fluids to keep your urine clear or pale yellow. Activity and Exercise   Exercise only as directed by your health care provider. Exercising will help you:  Control your weight.  Stay in shape.  Be prepared for labor and delivery.  Experiencing pain or cramping in the lower abdomen or low back is a good sign that you should stop exercising. Check with your health care provider before continuing normal exercises.  Try to avoid standing for long periods of time. Move your legs often if you must stand in one place for a long time.  Avoid heavy lifting.  Wear low-heeled shoes, and practice good posture.  You may continue to have sex unless your health care provider directs you otherwise. Relief of Pain or Discomfort   Wear a good support bra for breast tenderness.   Take warm sitz baths to soothe any pain or discomfort caused by hemorrhoids. Use hemorrhoid cream if your health care provider approves.   Rest with your legs elevated if you have leg cramps or low back pain.  If you develop varicose veins in your legs, wear support hose. Elevate your feet for 15 minutes, 3-4 times a day. Limit salt in your diet. Prenatal Care   Schedule your prenatal visits by the twelfth week of pregnancy. They are usually scheduled monthly at first, then more often in the last 2 months before delivery.  Write down your questions. Take them to your prenatal visits.  Keep all your prenatal visits as directed by your health care provider. Safety   Wear your seat belt at all times when driving.  Make a list of emergency  phone numbers, including numbers for family, friends, the hospital, and police and fire departments. General Tips   Ask your health care provider for a referral to a local prenatal education class. Begin classes no later than at the beginning of month 6 of your pregnancy.  Ask for help if you have counseling or nutritional needs during pregnancy. Your health care provider can offer advice or refer you to specialists for help with various needs.  Do not use hot tubs, steam rooms, or saunas.  Do not douche or use tampons or scented sanitary pads.  Do not cross your legs for long periods of time.  Avoid cat litter boxes and soil used by cats. These carry germs that can cause birth defects in the baby and possibly loss of the fetus by miscarriage or stillbirth.  Avoid all smoking, herbs, alcohol, and medicines not prescribed by your health care provider. Chemicals in these affect the formation and growth of the baby.  Do not use any tobacco products, including cigarettes, chewing tobacco, and electronic cigarettes. If you need help quitting, ask your health care provider. You may receive counseling support and other resources to help you quit.  Schedule a dentist appointment. At home, brush your teeth with a soft toothbrush and be gentle when you floss. SEEK MEDICAL CARE IF:   You have dizziness.  You have mild pelvic cramps, pelvic pressure, or nagging pain in the abdominal area.  You have persistent nausea, vomiting, or diarrhea.  You have a bad smelling vaginal discharge.  You have pain with urination.  You notice increased swelling in your face, hands, legs, or ankles. SEEK IMMEDIATE MEDICAL  CARE IF:   You have a fever.  You are leaking fluid from your vagina.  You have spotting or bleeding from your vagina.  You have severe abdominal cramping or pain.  You have rapid weight gain or loss.  You vomit blood or material that looks like coffee grounds.  You are exposed to  [place] and have never had them.  You are exposed to fifth disease or chickenpox.  You develop a severe headache.  You have shortness of breath.  You have any kind of trauma, such as from a fall or a car accident. This information is not intended to replace advice given to you by your health care provider. Make sure you discuss any questions you have with your health care provider. Document Released: 11/07/2001 Document Revised: 12/04/2014 Document Reviewed: 09/23/2013 Elsevier Interactive Patient Education  2017 Else[employer] Inc. Commonly Asked Questions During Pregnancy  Cats: A parasite can be excreted in cat feces.  To avoid exposure you need to have another person empty the little box.  If you must empty the litter box you will need to wear gloves.  Wash your hands after handling your cat.  This parasite can also be found in raw or undercooked meat so this should also be avoided.  Colds, Sore Throats, Flu: Please check your medication sheet to see what you can take for symptoms.  If your symptoms are unrelieved by these medications please call the office.  Dental Work: Most any dental work Agricultural consultantyour dentist recommends is permitted.  X-rays should only be taken during the first trimester if absolutely necessary.  Your abdomen should be shielded with a lead apron during all x-rays.  Please notify your provider prior to receiving any x-rays.  Novocaine is fine; gas is not recommended.  If your dentist requires a note from us prior to dental work please call the office and we will provide one for you.  Exercise: Exercise is an important part of staying healthy during your pregnancy.  You may continue most exercises you were accustomed to prior to pregnancy.  Later in your pregnancy you will most likely notice you have difficulty with activities requiring balance like riding a bicycle.  It is important that you listen to your body and avoid activities that put you at a higher risk of falling.   Adequate rest and staying well hydrated are a must!  If you have questions about the safety of specific activities ask your provider.    Exposure to Children with illness: Try to avoid obvious exposure; report any symptoms to us when noted,  If you have chicken pos, red measles or mumps, you should be immune to these diseases.   Please do not take any vaccines while pregnant unless you have checked with your OB provider.  Fetal Movement: After 28 weeks we recommend you do "kick counts" twice daily.  Lie or sit down in a calm quiet environment and count your baby movements "kicks".  You should feel your baby at least 10 times per hour.  If you have not felt 10 kicks within the first hour get up, walk around and have something sweet to eat or drink then repeat for an additional hour.  If count remains less than 10 per hour notify your provider.  Fumigating: Follow your pest control agent's advice as to how long to stay out of your home.  Ventilate the area well before re-entering.  Hemorrhoids:   Most over-the-counter preparations can be used during pregnancy.  Check  your medication to see what is safe to use.  It is important to use a stool softener or fiber in your diet and to drink lots of liquids.  If hemorrhoids seem to be getting worse please call the office.   Hot Tubs:  Hot tubs Jacuzzis and saunas are not recommended while pregnant.  These increase your internal body temperature and should be avoided.  Intercourse:  Sexual intercourse is safe during pregnancy as long as you are comfortable, unless otherwise advised by your provider.  Spotting may occur after intercourse; report any bright red bleeding that is heavier than spotting.  Labor:  If you know that you are in labor, please go to the hospital.  If you are unsure, please call the office and let us help you decide what to do.  Lifting, straining, etc:  If your job requires heavy lifting or straining please check with your provider for  any limitations.  Generally, you should not lift items heavier than that you can lift simply with your hands and arms (no back muscles)  Painting:  Paint fumes do not harm your pregnancy, but may make you ill and should be avoided if possible.  Latex or water based paints have less odor than oils.  Use adequate ventilation while painting.  Permanents & Hair Color:  Chemicals in hair dyes are not recommended as they cause increase hair dryness which can increase hair loss during pregnancy.  " Highlighting" and permanents are allowed.  Dye may be absorbed differently and permanents may not hold as well during pregnancy.  Sunbathing:  Use a sunscreen, as skin burns easily during pregnancy.  Drink plenty of fluids; avoid over heating.  Tanning Beds:  Because their possible side effects are still unknown, tanning beds are not recommended.  Ultrasound Scans:  Routine ultrasounds are performed at approximately 20 weeks.  You will be able to see your baby's general anatomy an if you would like to know the gender this can usually be determined as well.  If it is questionable when you conceived you may also receive an ultrasound early in your pregnancy for dating purposes.  Otherwise ultrasound exams are not routinely performed unless there is a medical necessity.  Although you can request a scan we ask that you pay for it when conducted because insurance does not cover " patient request" scans.  Work: If your pregnancy proceeds without complications you may work until your due date, unless your physician or employer advises otherwise.  Round Ligament Pain/Pelvic Discomfort:  Sharp, shooting pains not associated with bleeding are fairly common, usually occurring in the second trimester of pregnancy.  They tend to be worse when standing up or when you remain standing for long periods of time.  These are the result of pressure of certain pelvic ligaments called "round ligaments".  Rest, Tylenol and heat seem to be  the most effective relief.  As the womb and fetus grow, they rise out of the pelvis and the discomfort improves.  Please notify the office if your pain seems different than that described.  It may represent a more serious condition.

## 2016-11-23 NOTE — Progress Notes (Signed)
Celestia Klayman presents for NOB nurse interview visit. Pregnancy confirmation done  at[facility] [place].  G4- .  [id number]-  In vitro on 10/26/16 with embryos at 2 5/7.  Beta in [place]. 12/8- 113 and 12/11- 397.  Pt currently on estradiol inj. Progesterone inj and po.  Pt needs u/s to confirm viability. Vag spotting noted x 1 day very minimal per pt. Mild nausea. No meds needed at this time. Pregnancy education material explained and given. _POS__ cats in the home. Advised to not change litter box.  NOB labs ordered. TSH/HbgA1c ordered due to Increased BMI. Drug screen and HIV ordered. PNV encouraged. Genetic screening  to discuss with provider. Viability scan asap. Pt. To follow up with Dr. Valentino Saxonherry in 3-4 _ weeks for NOB physical.  All questions answered.

## 2016-11-25 LAB — CULTURE, OB URINE

## 2016-11-25 LAB — MONITOR DRUG PROFILE 14(MW)
AMPHETAMINE SCREEN URINE: NEGATIVE ng/mL
BARBITURATE SCREEN URINE: NEGATIVE ng/mL
BENZODIAZEPINE SCREEN, URINE: NEGATIVE ng/mL
Buprenorphine, Urine: NEGATIVE ng/mL
CANNABINOIDS UR QL SCN: NEGATIVE ng/mL
Cocaine (Metab) Scrn, Ur: NEGATIVE ng/mL
Creatinine(Crt), U: 226 mg/dL (ref 20.0–300.0)
Fentanyl, Urine: NEGATIVE pg/mL
METHADONE SCREEN, URINE: NEGATIVE ng/mL
Meperidine Screen, Urine: NEGATIVE ng/mL
OPIATE SCREEN URINE: NEGATIVE ng/mL
OXYCODONE+OXYMORPHONE UR QL SCN: NEGATIVE ng/mL
PH UR, DRUG SCRN: 5.4 (ref 4.5–8.9)
Phencyclidine Qn, Ur: NEGATIVE ng/mL
Propoxyphene Scrn, Ur: NEGATIVE ng/mL
SPECIFIC GRAVITY: 1.018
TRAMADOL SCREEN, URINE: NEGATIVE ng/mL

## 2016-11-25 LAB — URINALYSIS, ROUTINE W REFLEX MICROSCOPIC
Bilirubin, UA: NEGATIVE
Glucose, UA: NEGATIVE
Ketones, UA: NEGATIVE
Nitrite, UA: NEGATIVE
PH UA: 5 (ref 5.0–7.5)
Protein, UA: NEGATIVE
RBC, UA: NEGATIVE
Specific Gravity, UA: 1.024 (ref 1.005–1.030)
Urobilinogen, Ur: 0.2 mg/dL (ref 0.2–1.0)

## 2016-11-25 LAB — ABO AND RH: Rh Factor: POSITIVE

## 2016-11-25 LAB — CBC WITH DIFFERENTIAL/PLATELET
BASOS ABS: 0 10*3/uL (ref 0.0–0.2)
Basos: 1 %
EOS (ABSOLUTE): 0.3 10*3/uL (ref 0.0–0.4)
Eos: 4 %
HEMOGLOBIN: 14 g/dL (ref 11.1–15.9)
Hematocrit: 41.3 % (ref 34.0–46.6)
IMMATURE GRANS (ABS): 0 10*3/uL (ref 0.0–0.1)
Immature Granulocytes: 0 %
LYMPHS: 24 %
Lymphocytes Absolute: 1.7 10*3/uL (ref 0.7–3.1)
MCH: 28.9 pg (ref 26.6–33.0)
MCHC: 33.9 g/dL (ref 31.5–35.7)
MCV: 85 fL (ref 79–97)
MONOCYTES: 6 %
Monocytes Absolute: 0.4 10*3/uL (ref 0.1–0.9)
NEUTROS ABS: 4.7 10*3/uL (ref 1.4–7.0)
Neutrophils: 65 %
Platelets: 311 10*3/uL (ref 150–379)
RBC: 4.85 x10E6/uL (ref 3.77–5.28)
RDW: 13.8 % (ref 12.3–15.4)
WBC: 7.2 10*3/uL (ref 3.4–10.8)

## 2016-11-25 LAB — MICROSCOPIC EXAMINATION: Casts: NONE SEEN /lpf

## 2016-11-25 LAB — HEMOGLOBIN A1C
ESTIMATED AVERAGE GLUCOSE: 100 mg/dL
Hgb A1c MFr Bld: 5.1 % (ref 4.8–5.6)

## 2016-11-25 LAB — NICOTINE SCREEN, URINE: COTININE UR QL SCN: NEGATIVE ng/mL

## 2016-11-25 LAB — HEPATITIS B SURFACE ANTIGEN: HEP B S AG: NEGATIVE

## 2016-11-25 LAB — GC/CHLAMYDIA PROBE AMP
CHLAMYDIA, DNA PROBE: NEGATIVE
Neisseria gonorrhoeae by PCR: NEGATIVE

## 2016-11-25 LAB — ANTIBODY SCREEN: ANTIBODY SCREEN: NEGATIVE

## 2016-11-25 LAB — TSH: TSH: 0.767 u[IU]/mL (ref 0.450–4.500)

## 2016-11-25 LAB — RUBELLA SCREEN: RUBELLA: 1.47 {index} (ref >0.99)

## 2016-11-25 LAB — TOXOPLASMA ANTIBODIES- IGG AND  IGM: Toxoplasma Antibody- IgM: <3 AU/mL (ref 0.0–7.9)

## 2016-11-25 LAB — URINE CULTURE, OB REFLEX

## 2016-11-25 LAB — HIV ANTIBODY (ROUTINE TESTING W REFLEX): HIV SCREEN 4TH GENERATION: NONREACTIVE

## 2016-11-25 LAB — VARICELLA ZOSTER ANTIBODY, IGG: Varicella zoster IgG: 393 index (ref >165)

## 2016-11-25 LAB — RPR: RPR: NONREACTIVE

## 2016-11-27 NOTE — L&D Delivery Note (Signed)
        Delivery Note   Jennifer Best is a 26 y.o. [id number] at 4254w4d Estimated Date of Delivery: 07/12/17  PRE-OPERATIVE DIAGNOSIS:  1) 1854w4d pregnancy.   POST-OPERATIVE DIAGNOSIS:  1) 8954w4d pregnancy s/p Vaginal, Spontaneous Delivery   Delivery Type: Vaginal, Spontaneous Delivery    Delivery Clinician: Asencion GowdaBEASLEY, BETHANY  Best, DAVID JAMES  Delivery Anesthesia: None  - Stadol - after 3rd stage.  Labor Complications:     Additional complications: PPH  ESTIMATED BLOOD LOSS: 1500  ml    FINDINGS:   1) female infant, Apgar scores of 8    at 1 minute and 9    at 5 minutes and a birthweight of 108  ounces.    2) Nuchal cord: NO  SPECIMENS:   PLACENTA:   Appearance: Intact    Removal: Spontaneous      Disposition:     DISPOSITION:  Infant to left in stable condition in the delivery room, with L&D personnel and mother,  NARRATIVE SUMMARY: Labor course:  [name] is a [id number] at 354w4d who presented for labor management.  She progressed well in labor without pitocin.  She received the appropriate anesthesia and proceeded to complete dilation. She evidenced good maternal expulsive effort during the second stage - pushing uncontrollably. She went on to deliver a viable infant. The placenta delivered without problems and was noted to be complete. A perineal and vaginal examination was performed. Episiotomy/Lacerations: Periurethral small approximated - hemostatic. Pt experienced PPH from uterine atony.  Her anatomy made uterine massage difficult and the uterus would not stay firm. Order of measures performed:  Pitocin in IV  Constant fundal massage  Spec exam of cervix - intact without tear  400mg  Mistoprostol  Empty bladder - 50cc  Bimanual uterine massage. Hemorrhage resolved at this point.   Jennifer Best, M.D. 07/02/2017 8:47 AM

## 2016-11-28 ENCOUNTER — Telehealth: Payer: Self-pay | Admitting: Obstetrics and Gynecology

## 2016-11-28 NOTE — Telephone Encounter (Signed)
She said she was the physician who ordered some tests for this pt. She said she never received the results.

## 2016-11-29 NOTE — Telephone Encounter (Signed)
Spoke with someone who states there is [facility], asked if there was a [place], he states that he is the on call doctor and that Chana BodeKeona is one of the nurses however they are not open yet as the are on [place] time. Will call back later in the day.

## 2016-12-05 NOTE — Telephone Encounter (Signed)
Called number listed, no answer.  

## 2016-12-27 ENCOUNTER — Ambulatory Visit (INDEPENDENT_AMBULATORY_CARE_PROVIDER_SITE_OTHER): Payer: 59 | Admitting: Obstetrics and Gynecology

## 2016-12-27 VITALS — BP 107/73 | HR 101 | Wt 224.3 lb

## 2016-12-27 DIAGNOSIS — Z124 Encounter for screening for malignant neoplasm of cervix: Secondary | ICD-10-CM

## 2016-12-27 DIAGNOSIS — Z8759 Personal history of other complications of pregnancy, childbirth and the puerperium: Secondary | ICD-10-CM

## 2016-12-27 DIAGNOSIS — Z333 Pregnant state, gestational carrier: Secondary | ICD-10-CM

## 2016-12-27 DIAGNOSIS — E669 Obesity, unspecified: Secondary | ICD-10-CM

## 2016-12-27 LAB — POCT URINALYSIS DIPSTICK
BILIRUBIN UA: NEGATIVE
Glucose, UA: NEGATIVE
Ketones, UA: NEGATIVE
Nitrite, UA: NEGATIVE
Protein, UA: NEGATIVE
Spec Grav, UA: >=1.03
Urobilinogen, UA: NEGATIVE
pH, UA: 5

## 2016-12-27 NOTE — Progress Notes (Deleted)
OBSTETRIC INITIAL PRENATAL VISIT  Subjective:    Jennifer Best is being seen today for her first obstetrical visit.  This is a planned pregnancy. Pregnancy is through IVF (patient is a surrogate). She is a [id number] female at [redacted]w[redacted]d gestation, Estimated Date of Delivery: 07/12/17 with Patient's last menstrual period was 10/05/2016. (consistent with 6 week sono). Her obstetrical history is significant for obesity and h/o ectopic pregnancy. Relationship with FOB: spouse, living together. Patient does not intend to breast feed. Pregnancy history fully reviewed.    Obstetric History   G4   P2   T2   P0   A1   L2    SAB0   TAB0   Ectopic1   Multiple0   Live Births2     # Outcome Date GA Lbr Len/2nd Weight Sex Delivery Anes PTL Lv  4 Current           3 Ectopic 2017          2 Term 2015   7 lb 1.6 oz (3.221 kg) F Vag-Spont   LIV  1 Term 2012   6 lb 2.2 oz (2.785 kg) F Vag-Spont   LIV      Gynecologic History:  Last pap smear was ~2014 or 2015.  Results were normal.  Denies h/o abnormal pap smaers in the past.  Denies history of STIs.    Past Medical History:  Diagnosis Date  . Obesity (BMI 30-39.9)     Family History  Problem Relation Age of Onset  . Lung cancer Mother   . Breast cancer Maternal Grandmother   . Lung cancer Paternal Grandmother   . Prostate cancer Paternal Grandfather   . Diabetes Paternal Grandfather   . Diabetes Maternal Grandfather   . Ovarian cancer Neg Hx   . Colon cancer Neg Hx      Past Surgical History:  Procedure Laterality Date  . CHOLECYSTECTOMY    . LAPAROSCOPY Left 07/17/2016   Procedure: LAPAROSCOPY DIAGNOSTIC;  Surgeon: Hildred Laser, MD;  Location: [facility] ORS;  Service: Gynecology;  Laterality: Left;  removal ectopic pregnancy  . UNILATERAL SALPINGECTOMY Left 07/17/2016   Procedure: UNILATERAL SALPINGECTOMY;  Surgeon: Hildred Laser, MD;  Location: [facility] ORS;  Service: Gynecology;  Laterality: Left;     Social History   Social History  .  Marital status: Single    Spouse name: N/A  . Number of children: N/A  . Years of education: N/A   Occupational History  . Not on file.   Social History Main Topics  . Smoking status: Former Games developer  . Smokeless tobacco: Former Neurosurgeon  . Alcohol use No  . Drug use: No  . Sexual activity: Yes    Birth control/ protection: None   Other Topics Concern  . Not on file   Social History Narrative  . No narrative on file     Current Outpatient Prescriptions on File Prior to Visit  Medication Sig Dispense Refill  . Prenatal Vit-Fe Fumarate-FA (PRENATAL MULTIVITAMIN) TABS tablet Take 1 tablet by mouth daily at 12 noon.     No current facility-administered medications on file prior to visit.      Allergies  Allergen Reactions  . Morphine And Related Other (See Comments)    Makes "veins stand up"  . Latex Rash and Other (See Comments)    Watery eyes     Review of Systems General:Not Present- Fever, Weight Loss and Weight Gain. Skin:Not Present- Rash. HEENT:Not Present- Blurred Vision, Headache and Bleeding Gums.  Respiratory:Not Present- Difficulty Breathing. Breast:Not Present- Breast Mass. Cardiovascular:Not Present- Chest Pain, Elevated Blood Pressure, Fainting / Blacking Out and Shortness of Breath. Gastrointestinal:Not Present- Abdominal Pain, Constipation, Nausea and Vomiting. Female Genitourinary:Not Present- Frequency, Painful Urination, Pelvic Pain, Vaginal Bleeding, Vaginal Discharge, Contractions, regular, Fetal Movements Decreased, Urinary Complaints and Vaginal Fluid. Musculoskeletal:Not Present- Back Pain and Leg Cramps. Neurological:Not Present- Dizziness. Psychiatric:Not Present- Depression.     Objective:   Blood pressure 107/73, pulse (!) 101, weight 224 lb 4.8 oz (101.7 kg), last menstrual period 10/05/2016.  Body mass index is 36.76 kg/m.   General Appearance:    Alert, cooperative, no distress, appears stated age  Head:    Normocephalic,  without obvious abnormality, atraumatic  Eyes:    PERRL, conjunctiva/corneas clear, EOM's intact, both eyes  Ears:    Normal external ear canals, both ears  Nose:   Nares normal, septum midline, mucosa normal, no drainage or sinus tenderness  Throat:   Lips, mucosa, and tongue normal; teeth and gums normal  Neck:   Supple, symmetrical, trachea midline, no adenopathy; thyroid: no enlargement/tenderness/nodules; no carotid bruit or JVD  Back:     Symmetric, no curvature, ROM normal, no CVA tenderness  Lungs:     Clear to auscultation bilaterally, respirations unlabored  Chest Wall:    No tenderness or deformity   Heart:    Regular rate and rhythm, S1 and S2 normal, no murmur, rub or gallop  Breast Exam:    No tenderness, masses, or nipple abnormality  Abdomen:     Soft, non-tender, bowel sounds active all four quadrants, no masses, no organomegaly.  FH ***.  FHT ***  bpm.  Genitalia:    Pelvic:external genitalia normal, vagina without lesions, discharge, or tenderness, rectovaginal septum  normal. Cervix normal in appearance, no cervical motion tenderness, no adnexal masses or tenderness.  Pregnancy positive findings: uterine enlargement: *** wk size, nontender.   Rectal:    Normal external sphincter.  No hemorrhoids appreciated. Internal exam not done.   Extremities:   Extremities normal, atraumatic, no cyanosis or edema  Pulses:   2+ and symmetric all extremities  Skin:   Skin color, texture, turgor normal, no rashes or lesions  Lymph nodes:   Cervical, supraclavicular, and axillary nodes normal  Neurologic:   CNII-XII intact, normal strength, sensation and reflexes throughout       Assessment:    Pregnancy at {numbers; 0-42:17906} and {numbers; 0-7:15237}/7 weeks    Plan:    Initial labs reviewed. Prenatal vitamins encouraged. Problem list reviewed and updated. New OB counseling:  The patient has been given an overview regarding routine prenatal care.  Recommendations regarding diet,  weight gain, and exercise in pregnancy were given. Prenatal testing, optional genetic testing, and ultrasound use in pregnancy were reviewed.  AFP3 discussed: undecided.. Benefits of Breast Feeding were discussed. The patient is encouraged to consider nursing her baby post partum. Follow up in 4 weeks.  ***% of *** min visit spent on counseling and coordination of care.

## 2016-12-27 NOTE — Patient Instructions (Signed)
Second Trimester of Pregnancy The second trimester is from week 13 through week 28 (months 4 through 6). The second trimester is often a time when you feel your best. Your body has also adjusted to being pregnant, and you begin to feel better physically. Usually, morning sickness has lessened or quit completely, you may have more energy, and you may have an increase in appetite. The second trimester is also a time when the fetus is growing rapidly. At the end of the sixth month, the fetus is about 9 inches long and weighs about 1 pounds. You will likely begin to feel the baby move (quickening) between 18 and 20 weeks of the pregnancy. Body changes during your second trimester Your body continues to go through many changes during your second trimester. The changes vary from woman to woman.  Your weight will continue to increase. You will notice your lower abdomen bulging out.  You may begin to get stretch marks on your hips, abdomen, and breasts.  You may develop headaches that can be relieved by medicines. The medicines should be approved by your health care provider.  You may urinate more often because the fetus is pressing on your bladder.  You may develop or continue to have heartburn as a result of your pregnancy.  You may develop constipation because certain hormones are causing the muscles that push waste through your intestines to slow down.  You may develop hemorrhoids or swollen, bulging veins (varicose veins).  You may have back pain. This is caused by:  Weight gain.  Pregnancy hormones that are relaxing the joints in your pelvis.  A shift in weight and the muscles that support your balance.  Your breasts will continue to grow and they will continue to become tender.  Your gums may bleed and may be sensitive to brushing and flossing.  Dark spots or blotches (chloasma, mask of pregnancy) may develop on your face. This will likely fade after the baby is born.  A dark line  from your belly button to the pubic area (linea nigra) may appear. This will likely fade after the baby is born.  You may have changes in your hair. These can include thickening of your hair, rapid growth, and changes in texture. Some women also have hair loss during or after pregnancy, or hair that feels dry or thin. Your hair will most likely return to normal after your baby is born. What to expect at prenatal visits During a routine prenatal visit:  You will be weighed to make sure you and the fetus are growing normally.  Your blood pressure will be taken.  Your abdomen will be measured to track your baby's growth.  The fetal heartbeat will be listened to.  Any test results from the previous visit will be discussed. Your health care provider may ask you:  How you are feeling.  If you are feeling the baby move.  If you have had any abnormal symptoms, such as leaking fluid, bleeding, severe headaches, or abdominal cramping.  If you are using any tobacco products, including cigarettes, chewing tobacco, and electronic cigarettes.  If you have any questions. Other tests that may be performed during your second trimester include:  Blood tests that check for:  Low iron levels (anemia).  Gestational diabetes (between 24 and 28 weeks).  Rh antibodies. This is to check for a protein on red blood cells (Rh factor).  Urine tests to check for infections, diabetes, or protein in the urine.  An ultrasound to   confirm the proper growth and development of the baby.  An amniocentesis to check for possible genetic problems.  Fetal screens for spina bifida and Down syndrome.  HIV (human immunodeficiency virus) testing. Routine prenatal testing includes screening for HIV, unless you choose not to have this test. Follow these instructions at home: Eating and drinking  Continue to eat regular, healthy meals.  Avoid raw meat, uncooked cheese, cat litter boxes, and soil used by cats. These  carry germs that can cause birth defects in the baby.  Take your prenatal vitamins.  Take 1500-2000 mg of calcium daily starting at the 20th week of pregnancy until you deliver your baby.  If you develop constipation:  Take over-the-counter or prescription medicines.  Drink enough fluid to keep your urine clear or pale yellow.  Eat foods that are high in fiber, such as fresh fruits and vegetables, whole grains, and beans.  Limit foods that are high in fat and processed sugars, such as fried and sweet foods. Activity  Exercise only as directed by your health care provider. Experiencing uterine cramps is a good sign to stop exercising.  Avoid heavy lifting, wear low heel shoes, and practice good posture.  Wear your seat belt at all times when driving.  Rest with your legs elevated if you have leg cramps or low back pain.  Wear a good support bra for breast tenderness.  Do not use hot tubs, steam rooms, or saunas. Lifestyle  Avoid all smoking, herbs, alcohol, and unprescribed drugs. These chemicals affect the formation and growth of the baby.  Do not use any products that contain nicotine or tobacco, such as cigarettes and e-cigarettes. If you need help quitting, ask your health care provider.  A sexual relationship may be continued unless your health care provider directs you otherwise. General instructions  Follow your health care provider's instructions regarding medicine use. There are medicines that are either safe or unsafe to take during pregnancy.  Take warm sitz baths to soothe any pain or discomfort caused by hemorrhoids. Use hemorrhoid cream if your health care provider approves.  If you develop varicose veins, wear support hose. Elevate your feet for 15 minutes, 3-4 times a day. Limit salt in your diet.  Visit your dentist if you have not gone yet during your pregnancy. Use a soft toothbrush to brush your teeth and be gentle when you floss.  Keep all follow-up  prenatal visits as told by your health care provider. This is important. Contact a health care provider if:  You have dizziness.  You have mild pelvic cramps, pelvic pressure, or nagging pain in the abdominal area.  You have persistent nausea, vomiting, or diarrhea.  You have a bad smelling vaginal discharge.  You have pain with urination. Get help right away if:  You have a fever.  You are leaking fluid from your vagina.  You have spotting or bleeding from your vagina.  You have severe abdominal cramping or pain.  You have rapid weight gain or weight loss.  You have shortness of breath with chest pain.  You notice sudden or extreme swelling of your face, hands, ankles, feet, or legs.  You have not felt your baby move in over an hour.  You have severe headaches that do not go away with medicine.  You have vision changes. Summary  The second trimester is from week 13 through week 28 (months 4 through 6). It is also a time when the fetus is growing rapidly.  Your body goes   through many changes during pregnancy. The changes vary from woman to woman.  Avoid all smoking, herbs, alcohol, and unprescribed drugs. These chemicals affect the formation and growth your baby.  Do not use any tobacco products, such as cigarettes, chewing tobacco, and e-cigarettes. If you need help quitting, ask your health care provider.  Contact your health care provider if you have any questions. Keep all prenatal visits as told by your health care provider. This is important. This information is not intended to replace advice given to you by your health care provider. Make sure you discuss any questions you have with your health care provider. Document Released: 11/07/2001 Document Revised: 04/20/2016 Document Reviewed: 01/14/2013 Elsevier Interactive Patient Education  2017 Elsevier Inc.  

## 2016-12-29 NOTE — Progress Notes (Signed)
OBSTETRIC INITIAL PRENATAL VISIT  Subjective:    Jennifer Best is being seen today for her first obstetrical visit.  This is a planned pregnancy. Pregnancy is through IVF (patient is a surrogate). She is a 26 y.o. [id number] female at 3753w6d gestation, Estimated Date of Delivery: 07/12/17 with Patient's last menstrual period was 10/05/2016. (consistent with 6 week sono). Her obstetrical history is significant for obesity and h/o ectopic pregnancy. Relationship with FOB: spouse, living together. Patient does not intend to breast feed. Pregnancy history fully reviewed.    Obstetric History   G4   P2   T2   P0   A1   L2    SAB0   TAB0   Ectopic1   Multiple0   Live Births2     # Outcome Date GA Lbr Len/2nd Weight Sex Delivery Anes PTL Lv  4 Current           3 Ectopic 2017          2 Term 2015   7 lb 1.6 oz (3.221 kg) F Vag-Spont   LIV  1 Term 2012   6 lb 2.2 oz (2.785 kg) F Vag-Spont   LIV      Gynecologic History:  Last pap smear was ~2014 or 2015.  Results were normal.  Denies h/o abnormal pap smears in the past.  Denies history of STIs.  Contraception: None  Past Medical History:  Diagnosis Date  . Obesity (BMI 30-39.9)     Family History  Problem Relation Age of Onset  . Lung cancer Mother   . Breast cancer Maternal Grandmother   . Lung cancer Paternal Grandmother   . Prostate cancer Paternal Grandfather   . Diabetes Paternal Grandfather   . Diabetes Maternal Grandfather   . Ovarian cancer Neg Hx   . Colon cancer Neg Hx      Past Surgical History:  Procedure Laterality Date  . CHOLECYSTECTOMY    . LAPAROSCOPY Left 07/17/2016   Procedure: LAPAROSCOPY DIAGNOSTIC;  Surgeon: Hildred LaserAnika Jaquasha Carnevale, MD;  Location: [facility];  Service: Gynecology;  Laterality: Left;  removal ectopic pregnancy  . UNILATERAL SALPINGECTOMY Left 07/17/2016   Procedure: UNILATERAL SALPINGECTOMY;  Surgeon: Hildred LaserAnika Mariem Skolnick, MD;  Location: [facility] ORS;  Service: Gynecology;  Laterality: Left;    Social History    Social History  . Marital status: Single    Spouse name: N/A  . Number of children: N/A  . Years of education: N/A   Occupational History  . Not on file.   Social History Main Topics  . Smoking status: Former Games developermoker  . Smokeless tobacco: Former NeurosurgeonUser  . Alcohol use No  . Drug use: No  . Sexual activity: Yes    Birth control/ protection: None   Other Topics Concern  . Not on file   Social History Narrative  . No narrative on file    Current Outpatient Prescriptions on File Prior to Visit  Medication Sig Dispense Refill  . Prenatal Vit-Fe Fumarate-FA (PRENATAL MULTIVITAMIN) TABS tablet Take 1 tablet by mouth daily at 12 noon.     No current facility-administered medications on file prior to visit.     Allergies  Allergen Reactions  . Morphine And Related Other (See Comments)    Makes "veins stand up"  . Latex Rash and Other (See Comments)    Watery eyes     Review of Systems General:Not Present- Fever, Weight Loss and Weight Gain. Skin:Not Present- Rash. HEENT:Not Present- Blurred Vision, Headache and Bleeding Gums.  Respiratory:Not Present- Difficulty Breathing. Breast:Not Present- Breast Mass. Cardiovascular:Not Present- Chest Pain, Elevated Blood Pressure, Fainting / Blacking Out and Shortness of Breath. Gastrointestinal:Present - Nausea (mild). Not Present- Abdominal Pain, Constipation, and Vomiting. Female Genitourinary:Not Present- Frequency, Painful Urination, Pelvic Pain, Vaginal Bleeding, Vaginal Discharge, Contractions, regular, Fetal Movements Decreased, Urinary Complaints and Vaginal Fluid. Musculoskeletal:Not Present- Back Pain and Leg Cramps. Neurological:Not Present- Dizziness. Psychiatric:Not Present- Depression.     Objective:   Blood pressure 107/73, pulse (!) 101, weight 224 lb 4.8 oz (101.7 kg), last menstrual period 10/05/2016.  Body mass index is 36.76 kg/m.  General Appearance:    Alert, cooperative, no distress, appears  stated age, moderate obesity  Head:    Normocephalic, without obvious abnormality, atraumatic  Eyes:    PERRL, conjunctiva/corneas clear, EOM's intact, both eyes  Ears:    Normal external ear canals, both ears  Nose:   Nares normal, septum midline, mucosa normal, no drainage or sinus tenderness  Throat:   Lips, mucosa, and tongue normal; teeth and gums normal  Neck:   Supple, symmetrical, trachea midline, no adenopathy; thyroid: no enlargement/tenderness/nodules; no carotid bruit or JVD  Back:     Symmetric, no curvature, ROM normal, no CVA tenderness  Lungs:     Clear to auscultation bilaterally, respirations unlabored  Chest Wall:    No tenderness or deformity   Heart:    Regular rate and rhythm, S1 and S2 normal, no murmur, rub or gallop  Breast Exam:    No tenderness, masses, or nipple abnormality  Abdomen:     Soft, non-tender, bowel sounds active all four quadrants, no masses, no organomegaly.  FH 12 cm.  FHT 163 bpm.  Genitalia:    Pelvic:external genitalia normal, vagina without lesions, discharge, or tenderness, rectovaginal septum  normal. Cervix normal in appearance, no cervical motion tenderness, no adnexal masses or tenderness.  Pregnancy positive findings: uterine enlargement: 12 wk size, nontender.   Rectal:    Normal external sphincter.  No hemorrhoids appreciated. Internal exam not done.   Extremities:   Extremities normal, atraumatic, no cyanosis or edema  Pulses:   2+ and symmetric all extremities  Skin:   Skin color, texture, turgor normal, no rashes or lesions  Lymph nodes:   Cervical, supraclavicular, and axillary nodes normal  Neurologic:   CNII-XII intact, normal strength, sensation and reflexes throughout      Assessment:    Pregnancy at 11 and 6/7 weeks (surrogate) H/o ectopic pregnancy  Obesity (Class II)  Plan:   - Initial labs reviewed. - Prenatal vitamins encouraged. - Problem list reviewed and updated. - New OB counseling:  The patient has been given  an overview regarding routine prenatal care.  - Recommendations regarding diet, weight gain, and exercise in pregnancy were given. - Prenatal testing, optional genetic testing, and ultrasound use in pregnancy were reviewed.  Patient unsure if genetic testing done prior to conception.  Reviewed records with no prior mention of testing.  Given handouts on options for genetic testing, reminded of time-sensitivity of certain tests.  Benefits of Breast Feeding were discussed. The patient is encouraged to consider nursing her baby post partum. - Will need early glucola for obesity in pregnancy.  - Ultrasound confirmed viability and location of current pregnancy in light of prior h/o ectopic.  - Follow up in 4 weeks.  50% of 30 min visit spent on counseling and coordination of care.    Hildred Laser, MD [facility]

## 2016-12-30 DIAGNOSIS — E669 Obesity, unspecified: Secondary | ICD-10-CM | POA: Insufficient documentation

## 2016-12-30 LAB — PAP IG, CT-NG, RFX HPV ASCU
CHLAMYDIA, NUC. ACID AMP: NEGATIVE
GONOCOCCUS BY NUCLEIC ACID AMP: NEGATIVE
PAP SMEAR COMMENT: 0

## 2017-01-03 ENCOUNTER — Telehealth: Payer: Self-pay

## 2017-01-03 NOTE — Telephone Encounter (Signed)
Called pt informed her of the information below. Pt gave verbal understanding and states that she will speak with the parents of the fetus and call us back.

## 2017-01-03 NOTE — Telephone Encounter (Signed)
-----   Message from Hildred LaserAnika Cherry, MD sent at 01/03/2017 11:38 AM EST ----- Regarding: genetic teting Please inform patient that I received the remainder of her records from [facility].  She did not undergo any testing that looked for Down syndrome with the embryo transfer, so this is an option for her during the pregnancy (I previously discussed Panor[facility] vs 1st trimester screen).

## 2017-01-12 ENCOUNTER — Telehealth: Payer: Self-pay | Admitting: Certified Nurse Midwife

## 2017-01-12 NOTE — Telephone Encounter (Signed)
Patient called office with reports of brown spotting this morning with wiping.   Full name and date of birth verified.   Denies vomiting, abdominal pain, and vaginal bleeding.   Last intercourse morning of 01/10/2017.  Reassurance offered. Reviewed red flag symptoms and when to call.   Keep previously scheduled appointment.    Jennifer Best Michelle Rex Oesterle, CNM

## 2017-01-23 ENCOUNTER — Encounter: Payer: Self-pay | Admitting: Obstetrics and Gynecology

## 2017-01-23 ENCOUNTER — Other Ambulatory Visit: Payer: Self-pay | Admitting: Obstetrics and Gynecology

## 2017-01-23 ENCOUNTER — Ambulatory Visit (INDEPENDENT_AMBULATORY_CARE_PROVIDER_SITE_OTHER): Payer: 59 | Admitting: Obstetrics and Gynecology

## 2017-01-23 ENCOUNTER — Other Ambulatory Visit: Payer: 59

## 2017-01-23 VITALS — BP 138/77 | HR 48 | Wt 226.6 lb

## 2017-01-23 DIAGNOSIS — Z3492 Encounter for supervision of normal pregnancy, unspecified, second trimester: Secondary | ICD-10-CM

## 2017-01-23 DIAGNOSIS — Z23 Encounter for immunization: Secondary | ICD-10-CM | POA: Diagnosis not present

## 2017-01-23 LAB — POCT URINALYSIS DIPSTICK
BILIRUBIN UA: NEGATIVE
Blood, UA: NEGATIVE
Glucose, UA: NEGATIVE
Ketones, UA: NEGATIVE
Leukocytes, UA: NEGATIVE
NITRITE UA: NEGATIVE
Protein, UA: NEGATIVE
Spec Grav, UA: 1.02
Urobilinogen, UA: NEGATIVE
pH, UA: 5

## 2017-01-23 NOTE — Addendum Note (Signed)
Addended by: Brooke DareSICK, Ismaeel Arvelo L on: 01/23/2017 09:38 AM   Modules accepted: Orders

## 2017-01-23 NOTE — Progress Notes (Signed)
Early 1hr GCT today - AFP - schedule 19wk FAS

## 2017-01-23 NOTE — Addendum Note (Signed)
Addended by: Brooke DareSICK, Victoria Henshaw L on: 01/23/2017 02:31 PM   Modules accepted: Orders

## 2017-01-24 ENCOUNTER — Telehealth: Payer: Self-pay

## 2017-01-24 ENCOUNTER — Other Ambulatory Visit: Payer: Self-pay

## 2017-01-24 DIAGNOSIS — R7309 Other abnormal glucose: Secondary | ICD-10-CM

## 2017-01-24 LAB — GLUCOSE, 1 HOUR GESTATIONAL: GESTATIONAL DIABETES SCREEN: 151 mg/dL — AB (ref 65–139)

## 2017-01-24 NOTE — Addendum Note (Signed)
Addended by: Brooke DareSICK, Toy Eisemann L on: 01/24/2017 03:36 PM   Modules accepted: Orders

## 2017-01-24 NOTE — Telephone Encounter (Signed)
Left message for patient to call back with age of donor egg to complete req for AFP.

## 2017-01-31 LAB — AFP, QUAD SCREEN
DIA Mom Value: 1.17
DIA Value (EIA): 172.01 pg/mL
DSR (By Age)    1 IN: 892
DSR (Second Trimester) 1 IN: 867
GESTATIONAL AGE AFP: 15.6 wk
MSAFP MOM: 3.02
MSAFP: 72.1 ng/mL
MSHCG MOM: 1.07
MSHCG: 36130 m[IU]/mL
Maternal Age At EDD: 27.5 YEARS
Osb Risk: 114
Test Results:: POSITIVE — AB
UE3 MOM: 0.4
UE3 VALUE: 0.26 ng/mL
Weight: 227 [lb_av]

## 2017-02-06 ENCOUNTER — Telehealth: Payer: Self-pay

## 2017-02-06 NOTE — Telephone Encounter (Signed)
Spoke with patient- requested she come to the office to review lab work. Patient is unable to come today but an appointment was made for tomorrow at 11AM.

## 2017-02-07 ENCOUNTER — Ambulatory Visit (INDEPENDENT_AMBULATORY_CARE_PROVIDER_SITE_OTHER): Payer: 59 | Admitting: Obstetrics and Gynecology

## 2017-02-07 ENCOUNTER — Encounter: Payer: Self-pay | Admitting: Obstetrics and Gynecology

## 2017-02-07 ENCOUNTER — Telehealth: Payer: Self-pay

## 2017-02-07 VITALS — BP 131/81 | HR 90 | Wt 229.2 lb

## 2017-02-07 DIAGNOSIS — O280 Abnormal hematological finding on antenatal screening of mother: Secondary | ICD-10-CM

## 2017-02-07 DIAGNOSIS — Z3492 Encounter for supervision of normal pregnancy, unspecified, second trimester: Secondary | ICD-10-CM

## 2017-02-07 NOTE — Telephone Encounter (Signed)
-----   Message from Linzie Collinavid James Evans, MD sent at 02/06/2017 12:54 PM EDT ----- Please f/u up with this and have her make an appointment with me to be seen.

## 2017-02-07 NOTE — Telephone Encounter (Signed)
Patient has an appointment today 02/07/17 at 11 AM.

## 2017-02-07 NOTE — Progress Notes (Signed)
Patient seen today to discuss elevated risk of open neural tube defect based on quad screen. We discussed these risks in detail. We have discussed the meaning of a increased risk versus a definitive yes or no diagnosis. All of her questions were answered.  [facility] follow-[facility].

## 2017-02-15 ENCOUNTER — Other Ambulatory Visit: Payer: Self-pay | Admitting: *Deleted

## 2017-02-15 DIAGNOSIS — O280 Abnormal hematological finding on antenatal screening of mother: Secondary | ICD-10-CM

## 2017-02-19 ENCOUNTER — Other Ambulatory Visit: Payer: Self-pay | Admitting: Maternal & Fetal Medicine

## 2017-02-19 ENCOUNTER — Ambulatory Visit
Admission: RE | Admit: 2017-02-19 | Discharge: 2017-02-19 | Disposition: A | Discharge status: Home / Self Care | Payer: 59 | Source: Ambulatory Visit | Attending: Maternal & Fetal Medicine | Admitting: Maternal & Fetal Medicine

## 2017-02-19 DIAGNOSIS — O280 Abnormal hematological finding on antenatal screening of mother: Secondary | ICD-10-CM

## 2017-02-19 DIAGNOSIS — Z3A19 19 weeks gestation of pregnancy: Secondary | ICD-10-CM | POA: Insufficient documentation

## 2017-02-19 DIAGNOSIS — Z3481 Encounter for supervision of other normal pregnancy, first trimester: Secondary | ICD-10-CM

## 2017-02-19 DIAGNOSIS — O4422 Partial placenta previa NOS or without hemorrhage, second trimester: Secondary | ICD-10-CM | POA: Insufficient documentation

## 2017-02-19 HISTORY — DX: Other specified health status: Z78.9

## 2017-02-19 NOTE — Progress Notes (Addendum)
Referring Provider:  [facility] Length of consultation 20 minutes  Ms. Hack was [facility]  to discuss the results of her Maternal Serum Screen and her prenatal testing options.  This note is a summary of our discussion.  To review, a Maternal Serum AFP is a maternal blood test that measures maternal serum AFP levels to determine if a pregnancy is at higher risk for certain birth defects or problems; however, it cannot diagnose or rule out these conditions.  It is important to remember that an abnormal maternal serum screen (MSS) test does not necessarily mean that the baby has a problem.  Maternal serum screening can identify approximately 80% of open neural tube defects (i.e., spina bifida).  The neural tube consists of the fetal head and spine, and if this structure fails to close during development, spina bifida (open spine) or anencephaly (open skull) could result.   The MSS result revealed an increased level of AFP.  The value was 3.02 MOM (multiples of the median) which is above the cutoff of 2.5 MOM.  Increased levels of AFP occur for many reasons including: inaccurate pregnancy dating, the presence of twins, pregnancy complications, neural tube defects, abdominal wall defects or a normal pregnancy.  Before MSS screening, her risk to have a child with a neural tube defect was 1 or 2 per 1000. Given the elevated AFP value, the risk is now 1 in 114.  This means that the chance that this baby does not have a neural tube defect is >99%.  We discussed the following prenatal testing options for this pregnancy:   Ultrasound was recommended to evaluate the fetal anatomy, particularly the neural tube and to confirm the gestational age.  An ultrasound performed on 08/15/12 revealed that the patient was 18 weeks and 2 days pregnant.  There was no evidence of a neural tube defect on this ultrasound.  The fetal anatomy appeared normal,  although this cannot rule out all abnormalities.  Amniocentesis was offered because the AFP MoM exceeded a particular laboratory cutoff.  Amniocentesis can detect greater than 98% of neural tube defects.  It involves the removal of a small amount of amniotic fluid from the sac surrounding the fetus with the use of a thin needle through the maternal abdomen and uterus.  Fetal cells are directly evaluated and approximately 99% of chromosome conditions may also be detected.  Ultrasound guidance is used throughout the procedure. The main risks to this procedure include complications leading to miscarriage in less than 1 in 200 cases (0.5%).    It is important to remember that each pregnancy in the general population has a 2-3% chance for a birth defect that might not be detected prenatally.  We recommend 0.4mg  of folic acid, a B-complex vitamin, for all women of childbearing age.  Folic acid may help to prevent neural tube defects, and should be taken prior to and during pregnancy.  We inquired about the family history, and the patient reported that she is a surrogate for this pregnancy.  The biological father and his female partner conceived this pregnancy using his sperm and an anonymous egg donor.  Ms. Shanon Rosserage stated that she is not aware of any family history of birth defects or developmental delays, but that her knowledge of the father's family history is limited and she has no information about the egg donor.  I encouraged her to contact us if the couple has any questions or additional medical information.  We also inquired about the pregnancy history. Ms. Lumadue reported no complications thus far and deny the use of prescription medications, recreational drugs, tobacco or alcohol.  Unexplained elevations of maternal serum AFP have been associated with adverse pregnancy outcomes such as low birth weight, preterm labor, pre-eclampsia or fetal demise.  For this reason, another ultrasound should be considered at  26 to [redacted] weeks gestation to reassess fetal growth.  Given the normal results of the anatomy ultrasound today, the patient declined amniocentesis.  She plans to return in 9 weeks to follow growth and placental location.  If further questions or concerns arise, please do not hesitate to call us at [phone].  Cherly Anderson, MS, CGC  I was immediately available and supervising. Argentina Ponder, MD [facility]

## 2017-02-20 ENCOUNTER — Encounter: Payer: 59 | Admitting: Obstetrics and Gynecology

## 2017-02-23 ENCOUNTER — Encounter: Payer: Self-pay | Admitting: Obstetrics and Gynecology

## 2017-02-26 ENCOUNTER — Other Ambulatory Visit: Payer: 59

## 2017-04-19 ENCOUNTER — Other Ambulatory Visit: Payer: Self-pay | Admitting: *Deleted

## 2017-04-19 DIAGNOSIS — E669 Obesity, unspecified: Secondary | ICD-10-CM

## 2017-04-19 DIAGNOSIS — O4420 Partial placenta previa NOS or without hemorrhage, unspecified trimester: Secondary | ICD-10-CM

## 2017-04-26 ENCOUNTER — Ambulatory Visit
Admission: RE | Admit: 2017-04-26 | Discharge: 2017-04-26 | Disposition: A | Discharge status: Home / Self Care | Payer: 59 | Source: Ambulatory Visit | Attending: Maternal and Fetal Medicine | Admitting: Maternal and Fetal Medicine

## 2017-04-26 DIAGNOSIS — E669 Obesity, unspecified: Secondary | ICD-10-CM

## 2017-04-26 DIAGNOSIS — O4443 Low lying placenta NOS or without hemorrhage, third trimester: Secondary | ICD-10-CM | POA: Diagnosis present

## 2017-04-26 DIAGNOSIS — O4420 Partial placenta previa NOS or without hemorrhage, unspecified trimester: Secondary | ICD-10-CM

## 2017-04-26 DIAGNOSIS — Z3A29 29 weeks gestation of pregnancy: Secondary | ICD-10-CM | POA: Diagnosis not present

## 2017-05-21 ENCOUNTER — Other Ambulatory Visit: Payer: Self-pay | Admitting: *Deleted

## 2017-05-21 DIAGNOSIS — O99213 Obesity complicating pregnancy, third trimester: Secondary | ICD-10-CM

## 2017-05-24 ENCOUNTER — Ambulatory Visit
Admission: RE | Admit: 2017-05-24 | Discharge: 2017-05-24 | Disposition: A | Discharge status: Home / Self Care | Payer: 59 | Source: Ambulatory Visit | Attending: Obstetrics and Gynecology | Admitting: Obstetrics and Gynecology

## 2017-05-24 VITALS — BP 130/80 | HR 84 | Temp 97.8°F | Resp 18 | Wt 250.4 lb

## 2017-05-24 DIAGNOSIS — Z3A33 33 weeks gestation of pregnancy: Secondary | ICD-10-CM | POA: Insufficient documentation

## 2017-05-24 DIAGNOSIS — E669 Obesity, unspecified: Secondary | ICD-10-CM

## 2017-05-24 DIAGNOSIS — O99213 Obesity complicating pregnancy, third trimester: Secondary | ICD-10-CM | POA: Diagnosis present

## 2017-05-24 DIAGNOSIS — O280 Abnormal hematological finding on antenatal screening of mother: Secondary | ICD-10-CM | POA: Diagnosis not present

## 2017-05-28 ENCOUNTER — Telehealth: Payer: Self-pay | Admitting: Obstetrics and Gynecology

## 2017-05-28 NOTE — Telephone Encounter (Signed)
Patient called concerned about not having an appointment and not being seen in 2 months, Patient has been seen at the hospital for U/S's and would like to speak with someone in regards to what she needs to do next as far as scheduling an appointment. Patient was last seen by Dr. Logan BoresEvans 02/07/2017, And was a (No Show) on 02/20/2017. Please advise.

## 2017-05-29 NOTE — Telephone Encounter (Signed)
Message left on pts voicemail to call office to schedule appointment.

## 2017-05-31 ENCOUNTER — Encounter: Payer: 59 | Admitting: Obstetrics and Gynecology

## 2017-06-05 ENCOUNTER — Ambulatory Visit (INDEPENDENT_AMBULATORY_CARE_PROVIDER_SITE_OTHER): Payer: 59 | Admitting: Obstetrics and Gynecology

## 2017-06-05 VITALS — BP 129/89 | HR 97 | Wt 254.0 lb

## 2017-06-05 DIAGNOSIS — O0933 Supervision of pregnancy with insufficient antenatal care, third trimester: Secondary | ICD-10-CM | POA: Diagnosis not present

## 2017-06-05 DIAGNOSIS — O0993 Supervision of high risk pregnancy, unspecified, third trimester: Secondary | ICD-10-CM

## 2017-06-05 DIAGNOSIS — Z130 Encounter for screening for diseases of the blood and blood-forming organs and certain disorders involving the immune mechanism: Secondary | ICD-10-CM

## 2017-06-05 DIAGNOSIS — Z23 Encounter for immunization: Secondary | ICD-10-CM

## 2017-06-05 DIAGNOSIS — O280 Abnormal hematological finding on antenatal screening of mother: Secondary | ICD-10-CM

## 2017-06-05 DIAGNOSIS — O99810 Abnormal glucose complicating pregnancy: Secondary | ICD-10-CM | POA: Insufficient documentation

## 2017-06-05 DIAGNOSIS — Z333 Pregnant state, gestational carrier: Secondary | ICD-10-CM | POA: Diagnosis not present

## 2017-06-05 DIAGNOSIS — E669 Obesity, unspecified: Secondary | ICD-10-CM

## 2017-06-05 LAB — POCT URINALYSIS DIPSTICK
BILIRUBIN UA: NEGATIVE
Blood, UA: NEGATIVE
Glucose, UA: NEGATIVE
Ketones, UA: NEGATIVE
Leukocytes, UA: NEGATIVE
NITRITE UA: NEGATIVE
PH UA: 6 (ref 5.0–8.0)
Spec Grav, UA: 1.025 (ref 1.010–1.025)
UROBILINOGEN UA: 0.2 U/dL

## 2017-06-05 MED ORDER — TETANUS-DIPHTH-ACELL PERTUSSIS 5-2.5-18.5 LF-MCG/0.5 IM SUSP
0.5000 mL | Freq: Once | INTRAMUSCULAR | Status: AC
  Administered 2017-06-05: 0.5 mL via INTRAMUSCULAR

## 2017-06-05 NOTE — Progress Notes (Signed)
ROB: C/o occasional [employer]xton Hicks. Patient has missed several appointments (not seen since [redacted] weeks gestation). Notes that she missed a f/u appointment, and was never rescheduled.  Has been following with [facility] for ultrasounds regarding abnormal msAFP (declined amniocentesis).  Last ultrasound was 2 weeks ago, noted normal growth (scan reviewed).  Is due for next growth ultrasound at the end of this month. To begin antenatal testing per recommendations of MFM.  Patient also with elevated early 1 hr glucola at last visit, but has not had 3 hr testing done. Will schedule.  Desires to bottle feed (temporarily, but would like to donate),  Desires condoms for contraception. For Tdap today, signed blood consent, discussed cord blood banking (declines). RTC in 2 weeks.

## 2017-06-06 ENCOUNTER — Other Ambulatory Visit: Payer: 59

## 2017-06-06 DIAGNOSIS — O99810 Abnormal glucose complicating pregnancy: Secondary | ICD-10-CM

## 2017-06-06 DIAGNOSIS — Z130 Encounter for screening for diseases of the blood and blood-forming organs and certain disorders involving the immune mechanism: Secondary | ICD-10-CM

## 2017-06-07 ENCOUNTER — Telehealth: Payer: Self-pay

## 2017-06-07 DIAGNOSIS — O24419 Gestational diabetes mellitus in pregnancy, unspecified control: Secondary | ICD-10-CM

## 2017-06-07 LAB — CBC
HEMATOCRIT: 39.7 % (ref 34.0–46.6)
Hemoglobin: 12.8 g/dL (ref 11.1–15.9)
MCH: 29.2 pg (ref 26.6–33.0)
MCHC: 32.2 g/dL (ref 31.5–35.7)
MCV: 91 fL (ref 79–97)
PLATELETS: 215 10*3/uL (ref 150–379)
RBC: 4.38 x10E6/uL (ref 3.77–5.28)
RDW: 13.8 % (ref 12.3–15.4)
WBC: 10.6 10*3/uL (ref 3.4–10.8)

## 2017-06-07 LAB — GESTATIONAL GLUCOSE TOLERANCE
Glucose, Fasting: 81 mg/dL (ref 65–94)
Glucose, GTT - 1 Hour: 200 mg/dL — ABNORMAL HIGH (ref 65–179)
Glucose, GTT - 2 Hour: 178 mg/dL — ABNORMAL HIGH (ref 65–154)
Glucose, GTT - 3 Hour: 165 mg/dL — ABNORMAL HIGH (ref 65–139)

## 2017-06-07 NOTE — Telephone Encounter (Signed)
-----   Message from Hildred LaserAnika Cherry, MD sent at 06/07/2017  3:55 PM EDT ----- Please inform patient that she is gestational diabetic.  She needs referral to [facility] (ASAP as she is already 35 weeks)

## 2017-06-07 NOTE — Telephone Encounter (Signed)
Called pt informed her of the need for referral to [facility] due to GDM. Pt gave verbal understanding. Referral placed.

## 2017-06-19 ENCOUNTER — Ambulatory Visit (INDEPENDENT_AMBULATORY_CARE_PROVIDER_SITE_OTHER): Payer: 59 | Admitting: Obstetrics and Gynecology

## 2017-06-19 VITALS — BP 122/81 | HR 98 | Wt 263.1 lb

## 2017-06-19 DIAGNOSIS — Z113 Encounter for screening for infections with a predominantly sexual mode of transmission: Secondary | ICD-10-CM

## 2017-06-19 DIAGNOSIS — Z3483 Encounter for supervision of other normal pregnancy, third trimester: Secondary | ICD-10-CM

## 2017-06-19 DIAGNOSIS — Z3685 Encounter for antenatal screening for Streptococcus B: Secondary | ICD-10-CM

## 2017-06-19 DIAGNOSIS — Z333 Pregnant state, gestational carrier: Secondary | ICD-10-CM

## 2017-06-19 DIAGNOSIS — O99213 Obesity complicating pregnancy, third trimester: Secondary | ICD-10-CM

## 2017-06-19 LAB — POCT URINALYSIS DIPSTICK
Bilirubin, UA: NEGATIVE
Blood, UA: NEGATIVE
GLUCOSE UA: NEGATIVE
Ketones, UA: NEGATIVE
Leukocytes, UA: NEGATIVE
Nitrite, UA: NEGATIVE
PROTEIN UA: NEGATIVE
Spec Grav, UA: 1.015 (ref 1.010–1.025)
UROBILINOGEN UA: 0.2 U/dL
pH, UA: 7 (ref 5.0–8.0)

## 2017-06-19 NOTE — Progress Notes (Signed)
ROB:  Weight gain discussed in detail-strategies to limit weight gain reviewed. (Patient seems uninterested in trying to maintain weight.)  GC/CT GBS done today. Failed GTT - has not yet scheduled for diabetic education.  Stressed the [employer]importance.    U/S for growth scheduled as size > dates for this week.

## 2017-06-21 ENCOUNTER — Ambulatory Visit
Admission: RE | Admit: 2017-06-21 | Discharge: 2017-06-21 | Disposition: A | Discharge status: Home / Self Care | Payer: 59 | Source: Ambulatory Visit | Attending: Maternal & Fetal Medicine | Admitting: Maternal & Fetal Medicine

## 2017-06-21 DIAGNOSIS — O280 Abnormal hematological finding on antenatal screening of mother: Secondary | ICD-10-CM | POA: Insufficient documentation

## 2017-06-21 DIAGNOSIS — Z3A37 37 weeks gestation of pregnancy: Secondary | ICD-10-CM | POA: Diagnosis not present

## 2017-06-21 DIAGNOSIS — E669 Obesity, unspecified: Secondary | ICD-10-CM | POA: Diagnosis present

## 2017-06-21 DIAGNOSIS — Z6841 Body Mass Index (BMI) 40.0 and over, adult: Secondary | ICD-10-CM | POA: Diagnosis not present

## 2017-06-21 LAB — GC/CHLAMYDIA PROBE AMP
Chlamydia trachomatis, NAA: NEGATIVE
NEISSERIA GONORRHOEAE BY PCR: NEGATIVE

## 2017-06-23 LAB — CULTURE, BETA STREP (GROUP B ONLY): Strep Gp B Culture: NEGATIVE

## 2017-06-25 ENCOUNTER — Telehealth: Payer: Self-pay

## 2017-06-25 NOTE — Telephone Encounter (Signed)
-----   Message from Linzie Collinavid James Evans, MD sent at 06/25/2017 11:04 AM EDT ----- GBS and GC/CT are negative

## 2017-06-25 NOTE — Telephone Encounter (Signed)
mychart message sent

## 2017-06-27 ENCOUNTER — Ambulatory Visit (INDEPENDENT_AMBULATORY_CARE_PROVIDER_SITE_OTHER): Payer: 59 | Admitting: Obstetrics and Gynecology

## 2017-06-27 ENCOUNTER — Other Ambulatory Visit: Payer: 59

## 2017-06-27 VITALS — BP 144/88 | HR 75 | Wt 264.9 lb

## 2017-06-27 DIAGNOSIS — O24419 Gestational diabetes mellitus in pregnancy, unspecified control: Secondary | ICD-10-CM

## 2017-06-27 DIAGNOSIS — O99210 Obesity complicating pregnancy, unspecified trimester: Secondary | ICD-10-CM

## 2017-06-27 DIAGNOSIS — Z3483 Encounter for supervision of other normal pregnancy, third trimester: Secondary | ICD-10-CM

## 2017-06-27 LAB — POCT URINALYSIS DIPSTICK
Bilirubin, UA: NEGATIVE
Glucose, UA: NEGATIVE
Ketones, UA: NEGATIVE
LEUKOCYTES UA: NEGATIVE
NITRITE UA: NEGATIVE
PH UA: 6 (ref 5.0–8.0)
PROTEIN UA: NEGATIVE
RBC UA: NEGATIVE
Spec Grav, UA: 1.01 (ref 1.010–1.025)
Urobilinogen, UA: 0.2 E.U./dL

## 2017-06-27 NOTE — Progress Notes (Signed)
ROB: Patient notes contractions yesterday but none today. Still has not had [facility] appointment. Is not checking blood sugars as she does not have a meter.  Accucheck today in office 77, however patient last ate at 5:30 this morning (has been ~ 6 hrs since last meal). Discussed that due to patient's non-compliance with her newly diagnosed DM in pregnancy, as it is unclear if she could be diet controlled or would require medication and she is close to term, I would recommend delivery during 39th week. Patient notes understanding. Her last growth scan was last week, with normal growth (50%ile, 3304 grams), and normal AFI. Scheduled for IOL 07/05/17 (evening).  Surrogate parents present today for visit.

## 2017-06-30 DIAGNOSIS — O24419 Gestational diabetes mellitus in pregnancy, unspecified control: Secondary | ICD-10-CM | POA: Insufficient documentation

## 2017-06-30 NOTE — Progress Notes (Deleted)
ROB

## 2017-07-02 ENCOUNTER — Inpatient Hospital Stay
Admission: EM | Admit: 2017-07-02 | Discharge: 2017-07-03 | DRG: 774 | Disposition: A | Discharge status: Home / Self Care | Payer: 59 | Attending: Obstetrics and Gynecology | Admitting: Obstetrics and Gynecology

## 2017-07-02 DIAGNOSIS — E669 Obesity, unspecified: Secondary | ICD-10-CM | POA: Diagnosis present

## 2017-07-02 DIAGNOSIS — O9081 Anemia of the puerperium: Secondary | ICD-10-CM | POA: Diagnosis not present

## 2017-07-02 DIAGNOSIS — D62 Acute posthemorrhagic anemia: Secondary | ICD-10-CM | POA: Diagnosis not present

## 2017-07-02 DIAGNOSIS — O24429 Gestational diabetes mellitus in childbirth, unspecified control: Principal | ICD-10-CM | POA: Diagnosis present

## 2017-07-02 DIAGNOSIS — O99214 Obesity complicating childbirth: Secondary | ICD-10-CM | POA: Diagnosis present

## 2017-07-02 DIAGNOSIS — O134 Gestational [pregnancy-induced] hypertension without significant proteinuria, complicating childbirth: Secondary | ICD-10-CM | POA: Diagnosis not present

## 2017-07-02 DIAGNOSIS — Z3A38 38 weeks gestation of pregnancy: Secondary | ICD-10-CM | POA: Diagnosis not present

## 2017-07-02 DIAGNOSIS — Z3493 Encounter for supervision of normal pregnancy, unspecified, third trimester: Secondary | ICD-10-CM | POA: Diagnosis present

## 2017-07-02 LAB — CBC
HEMATOCRIT: 35.6 % (ref 35.0–47.0)
Hemoglobin: 12.2 g/dL (ref 12.0–16.0)
MCH: 29.8 pg (ref 26.0–34.0)
MCHC: 34.4 g/dL (ref 32.0–36.0)
MCV: 86.5 fL (ref 80.0–100.0)
Platelets: 188 10*3/uL (ref 150–440)
RBC: 4.11 MIL/uL (ref 3.80–5.20)
RDW: 13.9 % (ref 11.5–14.5)
WBC: 11.4 10*3/uL — AB (ref 3.6–11.0)

## 2017-07-02 LAB — CBC WITH DIFFERENTIAL/PLATELET
BASOS ABS: 0 10*3/uL (ref 0–0.1)
BASOS PCT: 0 %
BASOS PCT: 0 %
Basophils Absolute: 0 10*3/uL (ref 0–0.1)
EOS ABS: 0 10*3/uL (ref 0–0.7)
EOS ABS: 0 10*3/uL (ref 0–0.7)
Eosinophils Relative: 0 %
Eosinophils Relative: 0 %
HCT: 25.6 % — ABNORMAL LOW (ref 35.0–47.0)
HCT: 26.9 % — ABNORMAL LOW (ref 35.0–47.0)
HEMOGLOBIN: 8.7 g/dL — AB (ref 12.0–16.0)
HEMOGLOBIN: 9.2 g/dL — AB (ref 12.0–16.0)
LYMPHS ABS: 0.8 10*3/uL — AB (ref 1.0–3.6)
Lymphocytes Relative: 5 %
Lymphocytes Relative: 5 %
Lymphs Abs: 0.9 10*3/uL — ABNORMAL LOW (ref 1.0–3.6)
MCH: 29.5 pg (ref 26.0–34.0)
MCH: 29.5 pg (ref 26.0–34.0)
MCHC: 33.9 g/dL (ref 32.0–36.0)
MCHC: 34.3 g/dL (ref 32.0–36.0)
MCV: 87 fL (ref 80.0–100.0)
MCV: 86.3 fL (ref 80.0–100.0)
MONOS PCT: 6 %
Monocytes Absolute: 0.9 10*3/uL (ref 0.2–0.9)
Monocytes Absolute: 1.1 10*3/uL — ABNORMAL HIGH (ref 0.2–0.9)
Monocytes Relative: 6 %
NEUTROS ABS: 14.2 10*3/uL — AB (ref 1.4–6.5)
NEUTROS PCT: 89 %
NEUTROS PCT: 89 %
Neutro Abs: 15.9 10*3/uL — ABNORMAL HIGH (ref 1.4–6.5)
Platelets: 192 10*3/uL (ref 150–440)
Platelets: 204 10*3/uL (ref 150–440)
RBC: 2.94 MIL/uL — AB (ref 3.80–5.20)
RBC: 3.11 MIL/uL — ABNORMAL LOW (ref 3.80–5.20)
RDW: 13.6 % (ref 11.5–14.5)
RDW: 13.7 % (ref 11.5–14.5)
WBC: 16 10*3/uL — AB (ref 3.6–11.0)
WBC: 17.9 10*3/uL — AB (ref 3.6–11.0)

## 2017-07-02 LAB — GLUCOSE, CAPILLARY: Glucose-Capillary: 79 mg/dL (ref 65–99)

## 2017-07-02 LAB — PREPARE RBC (CROSSMATCH)

## 2017-07-02 MED ORDER — IBUPROFEN 600 MG PO TABS
600.0000 mg | ORAL_TABLET | Freq: Four times a day (QID) | ORAL | Status: DC
  Administered 2017-07-02 – 2017-07-03 (×5): 600 mg via ORAL
  Filled 2017-07-02 (×5): qty 1

## 2017-07-02 MED ORDER — CARBOPROST TROMETHAMINE 250 MCG/ML IM SOLN
INTRAMUSCULAR | Status: AC
  Filled 2017-07-02: qty 1

## 2017-07-02 MED ORDER — OXYCODONE-ACETAMINOPHEN 5-325 MG PO TABS
2.0000 | ORAL_TABLET | ORAL | Status: DC | PRN
  Administered 2017-07-02 (×3): 2 via ORAL
  Filled 2017-07-02 (×2): qty 2

## 2017-07-02 MED ORDER — ONDANSETRON HCL 4 MG/2ML IJ SOLN
4.0000 mg | Freq: Four times a day (QID) | INTRAMUSCULAR | Status: DC | PRN
  Administered 2017-07-02: 4 mg via INTRAVENOUS
  Filled 2017-07-02: qty 2

## 2017-07-02 MED ORDER — PRENATAL MULTIVITAMIN CH
1.0000 | ORAL_TABLET | Freq: Every day | ORAL | Status: DC
  Administered 2017-07-03: 1 via ORAL
  Filled 2017-07-02: qty 1

## 2017-07-02 MED ORDER — WITCH HAZEL-GLYCERIN EX PADS: 1.0000 "application " | MEDICATED_PAD | CUTANEOUS | Status: DC | PRN

## 2017-07-02 MED ORDER — AMMONIA AROMATIC IN INHA
RESPIRATORY_TRACT | Status: AC
  Filled 2017-07-02: qty 20

## 2017-07-02 MED ORDER — LACTATED RINGERS IV SOLN: 500.0000 mL | INTRAVENOUS | Status: DC | PRN

## 2017-07-02 MED ORDER — ACETAMINOPHEN 325 MG PO TABS: 650.0000 mg | ORAL_TABLET | ORAL | Status: DC | PRN

## 2017-07-02 MED ORDER — OXYTOCIN 40 UNITS IN LACTATED RINGERS INFUSION - SIMPLE MED
2.5000 [IU]/h | INTRAVENOUS | Status: DC | PRN
  Filled 2017-07-02: qty 1000

## 2017-07-02 MED ORDER — DOCUSATE SODIUM 100 MG PO CAPS
100.0000 mg | ORAL_CAPSULE | Freq: Two times a day (BID) | ORAL | Status: DC
  Administered 2017-07-02 – 2017-07-03 (×2): 100 mg via ORAL
  Filled 2017-07-02 (×2): qty 1

## 2017-07-02 MED ORDER — SIMETHICONE 80 MG PO CHEW: 80.0000 mg | CHEWABLE_TABLET | ORAL | Status: DC | PRN

## 2017-07-02 MED ORDER — ZOLPIDEM TARTRATE 5 MG PO TABS: 5.0000 mg | ORAL_TABLET | Freq: Every evening | ORAL | Status: DC | PRN

## 2017-07-02 MED ORDER — LIDOCAINE HCL (PF) 1 % IJ SOLN
INTRAMUSCULAR | Status: AC
  Filled 2017-07-02: qty 30

## 2017-07-02 MED ORDER — OXYTOCIN BOLUS FROM INFUSION
500.0000 mL | Freq: Once | INTRAVENOUS | Status: AC
  Administered 2017-07-02: 500 mL via INTRAVENOUS

## 2017-07-02 MED ORDER — COCONUT OIL OIL: 1.0000 "application " | TOPICAL_OIL | Status: DC | PRN

## 2017-07-02 MED ORDER — LACTATED RINGERS IV SOLN
INTRAVENOUS | Status: DC
  Administered 2017-07-02 (×2): via INTRAVENOUS

## 2017-07-02 MED ORDER — SODIUM CHLORIDE 0.9 % IV SOLN: Freq: Once | INTRAVENOUS | Status: DC

## 2017-07-02 MED ORDER — MISOPROSTOL 200 MCG PO TABS
ORAL_TABLET | ORAL | Status: AC
  Administered 2017-07-02: 800 ug via ORAL
  Filled 2017-07-02: qty 4

## 2017-07-02 MED ORDER — TETANUS-DIPHTH-ACELL PERTUSSIS 5-2.5-18.5 LF-MCG/0.5 IM SUSP: 0.5000 mL | Freq: Once | INTRAMUSCULAR | Status: DC

## 2017-07-02 MED ORDER — SOD CITRATE-CITRIC ACID 500-334 MG/5ML PO SOLN: 30.0000 mL | ORAL | Status: DC | PRN

## 2017-07-02 MED ORDER — OXYCODONE-ACETAMINOPHEN 5-325 MG PO TABS: 1.0000 | ORAL_TABLET | ORAL | Status: DC | PRN

## 2017-07-02 MED ORDER — TRANEXAMIC ACID 1000 MG/10ML IV SOLN
1000.0000 mg | Freq: Once | INTRAVENOUS | Status: AC
  Administered 2017-07-02: 1000 mg via INTRAVENOUS
  Filled 2017-07-02 (×2): qty 10

## 2017-07-02 MED ORDER — OXYCODONE-ACETAMINOPHEN 5-325 MG PO TABS
ORAL_TABLET | ORAL | Status: AC
  Filled 2017-07-02: qty 2

## 2017-07-02 MED ORDER — OXYTOCIN 10 UNIT/ML IJ SOLN
INTRAMUSCULAR | Status: AC
  Filled 2017-07-02: qty 2

## 2017-07-02 MED ORDER — BUTORPHANOL TARTRATE 2 MG/ML IJ SOLN
1.0000 mg | Freq: Once | INTRAMUSCULAR | Status: AC
  Administered 2017-07-02: 1 mg via INTRAVENOUS

## 2017-07-02 MED ORDER — LIDOCAINE HCL (PF) 1 % IJ SOLN
30.0000 mL | INTRAMUSCULAR | Status: DC | PRN
  Administered 2017-07-02: 30 mL via SUBCUTANEOUS

## 2017-07-02 MED ORDER — LACTATED RINGERS IV BOLUS (SEPSIS)
1000.0000 mL | Freq: Once | INTRAVENOUS | Status: AC
  Administered 2017-07-02: 1000 mL via INTRAVENOUS

## 2017-07-02 MED ORDER — ACETAMINOPHEN 325 MG PO TABS
650.0000 mg | ORAL_TABLET | ORAL | Status: DC | PRN
  Administered 2017-07-03: 650 mg via ORAL
  Filled 2017-07-02: qty 2

## 2017-07-02 MED ORDER — DIPHENHYDRAMINE HCL 25 MG PO CAPS
25.0000 mg | ORAL_CAPSULE | Freq: Four times a day (QID) | ORAL | Status: DC | PRN
  Administered 2017-07-03: 25 mg via ORAL
  Filled 2017-07-02: qty 1

## 2017-07-02 MED ORDER — BENZOCAINE-MENTHOL 20-0.5 % EX AERO: 1.0000 "application " | INHALATION_SPRAY | CUTANEOUS | Status: DC | PRN

## 2017-07-02 MED ORDER — AMMONIA AROMATIC IN INHA
RESPIRATORY_TRACT | Status: AC
  Administered 2017-07-02: 3.6 mL
  Filled 2017-07-02: qty 10

## 2017-07-02 MED ORDER — DIBUCAINE 1 % RE OINT: 1.0000 "application " | TOPICAL_OINTMENT | RECTAL | Status: DC | PRN

## 2017-07-02 MED ORDER — ONDANSETRON HCL 4 MG/2ML IJ SOLN: 4.0000 mg | INTRAMUSCULAR | Status: DC | PRN

## 2017-07-02 MED ORDER — ONDANSETRON HCL 4 MG PO TABS: 4.0000 mg | ORAL_TABLET | ORAL | Status: DC | PRN

## 2017-07-02 MED ORDER — BUTORPHANOL TARTRATE 2 MG/ML IJ SOLN
INTRAMUSCULAR | Status: AC
  Filled 2017-07-02: qty 1

## 2017-07-02 MED ORDER — OXYTOCIN 40 UNITS IN LACTATED RINGERS INFUSION - SIMPLE MED
2.5000 [IU]/h | INTRAVENOUS | Status: DC
  Administered 2017-07-02: 2.5 [IU]/h via INTRAVENOUS
  Filled 2017-07-02 (×2): qty 1000

## 2017-07-02 NOTE — Lactation Note (Signed)
This note was copied from a baby's chart. Lactation Consultation Note  Patient Name: Jennifer Best Today's Date: 07/02/2017     Maternal Data    Feeding    LATCH Score                   Interventions    Lactation Tools Discussed/Used   Mother is a surrogate and wants to donate breastmilk. Mom started using pump at 1800 and knows to pump every 3-4hrs in order to establish and build a milk supply. LC will f/u with pt in morning.   Consult Status      Burnadette PeterJaniya M Velia Pamer 07/02/2017, 5:55 PM

## 2017-07-02 NOTE — Progress Notes (Signed)
Pt shakinig  retaken

## 2017-07-02 NOTE — H&P (Signed)
History and Physical   HPI  Jennifer Best is a 26 y.o. [id number] at [redacted]w[redacted]d Estimated Date of Delivery: 07/12/17 who is being admitted for  labor management.   OB History  Obstetric History   G4   P3   T3   P0   A1   L2    SAB0   TAB0   Ectopic1   Multiple0   Live Births2     # Outcome Date GA Lbr Len/2nd Weight Sex Delivery Anes PTL Lv  4 Term 07/02/17 [redacted]w[redacted]d  6 lb 12 oz (3.062 kg) M Vag-Spont None  LIV     Name: Jennifer Best,BOY Jennifer Best     Apgar1:  8                Apgar5: 9  3 Ectopic 2017          2 Term 2015   7 lb 1.6 oz (3.221 kg) F Vag-Spont   LIV  1 Term 2012   6 lb 2.2 oz (2.785 kg) F Vag-Spont   LIV      PROBLEM LIST  Pregnancy complications or risks: Patient Active Problem List   Diagnosis Date Noted  . Labor and delivery, indication for care 07/02/2017  . Gestational diabetes mellitus (GDM) in third trimester 06/30/2017  . Abnormal glucose tolerance test in pregnancy 06/05/2017  . Abnormal antenatal AFP screen   . Obesity (BMI 35.0-39.9 without comorbidity) 12/30/2016  . History of ectopic pregnancy 06/30/2016    Prenatal labs and studies: ABO, Rh: --/--/O POS (08/06 0451) Antibody: NEG (08/06 0451) Rubella: 1.47 (12/28 0859) RPR: Non Reactive (12/28 0859)  HBsAg: Negative (12/28 0859)  HIV:   Neg GBS:  Neg   Past Medical History:  Diagnosis Date  . Medical history non-contributory   . Obesity (BMI 30-39.9)      Past Surgical History:  Procedure Laterality Date  . CHOLECYSTECTOMY    . LAPAROSCOPY Left 07/17/2016   Procedure: LAPAROSCOPY DIAGNOSTIC;  Surgeon: Hildred Laser, MD;  Location: [facility] ORS;  Service: Gynecology;  Laterality: Left;  removal ectopic pregnancy  . UNILATERAL SALPINGECTOMY Left 07/17/2016   Procedure: UNILATERAL SALPINGECTOMY;  Surgeon: Hildred Laser, MD;  Location: [facility] ORS;  Service: Gynecology;  Laterality: Left;     Medications    Current Discharge Medication List    CONTINUE these medications which have NOT CHANGED   Details  Prenatal Vit-Fe Fumarate-FA (PRENATAL MULTIVITAMIN) TABS tablet Take 1 tablet by mouth daily at 12 noon.        Allergies  Morphine and related and Latex  Review of Systems  Pertinent items noted in HPI and remainder of comprehensive ROS otherwise negative.  Physical Exam  BP (!) 101/47   Pulse (!) 143   Temp 97.8 F (36.6 C) (Oral)   Resp 18   Ht 5\' 6"  (1.676 m)   Wt 260 lb (117.9 kg)   LMP 10/05/2016   SpO2 98%   Breastfeeding? Unknown   BMI 41.97 kg/m   Lungs:  CTA B Cardio: RRR without M/R/G Abd: Soft, gravid, NT Presentation: cephalic EXT: No C/C/ 1+ Edema DTRs: 2+ B CERVIX: 4:75%: -2  See Prenatal records for more detailed PE.     FHR:  Variability: Good {> 6 bpm)  Toco: Uterine Contractions: q65min   Test Results  Results for orders placed or performed during the hospital encounter of 07/02/17 (from the past 24 hour(s))  Glucose, capillary     Status: None   Collection Time: 07/02/17  3:59 AM  Result Value Ref Range   Glucose-Capillary 79 65 - 99 mg/dL  Type and screen [facility]     Status: None   Collection Time: 07/02/17  4:51 AM  Result Value Ref Range   ABO/RH(D) O POS    Antibody Screen NEG    Sample Expiration 07/05/2017   CBC     Status: Abnormal   Collection Time: 07/02/17  5:30 AM  Result Value Ref Range   WBC 11.4 (H) 3.6 - 11.0 K/uL   RBC 4.11 3.80 - 5.20 MIL/uL   Hemoglobin 12.2 12.0 - 16.0 g/dL   HCT 16.135.6 09.635.0 - 04.547.0 %   MCV 86.5 80.0 - 100.0 fL   MCH 29.8 26.0 - 34.0 pg   MCHC 34.4 32.0 - 36.0 g/dL   RDW 40.913.9 81.111.5 - 91.414.5 %   Platelets 188 150 - 440 K/uL   Group B Strep negative  Assessment   G4P3012 at 850w4d Estimated Date of Delivery: 07/12/17  The fetus is reassuring.   Patient Active Problem List   Diagnosis Date Noted  . Labor and delivery, indication for care 07/02/2017  . Gestational diabetes mellitus (GDM) in third trimester 06/30/2017  . Abnormal glucose tolerance test in  pregnancy 06/05/2017  . Abnormal antenatal AFP screen   . Obesity (BMI 35.0-39.9 without comorbidity) 12/30/2016  . History of ectopic pregnancy 06/30/2016    Plan  1. Admit to L&D for anticipate vaginal delivery 2. EFM: -- Category 1 3. Epidural if desired. Stadol for IV pain until epidural requested. 4. Admission labs    Elonda Huskyavid J. Evans, M.D. 07/02/2017 8:39 AM

## 2017-07-02 NOTE — Progress Notes (Signed)
Retaken due to pt shaking

## 2017-07-03 ENCOUNTER — Other Ambulatory Visit: Payer: 59

## 2017-07-03 ENCOUNTER — Encounter: Payer: 59 | Admitting: Obstetrics and Gynecology

## 2017-07-03 LAB — RPR: RPR: NONREACTIVE

## 2017-07-03 LAB — CBC
HEMATOCRIT: 23.2 % — AB (ref 35.0–47.0)
HEMOGLOBIN: 7.8 g/dL — AB (ref 12.0–16.0)
MCH: 29.3 pg (ref 26.0–34.0)
MCHC: 33.5 g/dL (ref 32.0–36.0)
MCV: 87.7 fL (ref 80.0–100.0)
Platelets: 178 10*3/uL (ref 150–440)
RBC: 2.64 MIL/uL — ABNORMAL LOW (ref 3.80–5.20)
RDW: 13.5 % (ref 11.5–14.5)
WBC: 9.2 10*3/uL (ref 3.6–11.0)

## 2017-07-03 LAB — PREPARE RBC (CROSSMATCH)

## 2017-07-03 MED ORDER — SODIUM CHLORIDE 0.9 % IV SOLN
Freq: Once | INTRAVENOUS | Status: AC
  Administered 2017-07-03: 09:00:00 via INTRAVENOUS

## 2017-07-03 NOTE — Discharge Summary (Signed)
                               Discharge Summary  Date of Admission: 07/02/2017  Date of Discharge: 07-03-17  Admitting Diagnosis: Onset of Labor at 6128w4d  Secondary Diagnosis: Gestational diabetes  Mode of Delivery: normal spontaneous vaginal delivery       Discharge Diagnosis: Post partum hemorrhage   Intrapartum Procedures:    Post partum procedures: IV Pitocin, Misoprostol, Tranexamic acid,  blood transfusion  Complications: hemorrhage                      Discharge Day SOAP Note:  Progress Note - Vaginal Delivery  Jennifer Best is a 26 y.o. [id number] now PP day 1 s/p Vaginal, Spontaneous Delivery . Delivery was complicated by gestational diabetes and PPH  Subjective  The patient has the following complaints: has no unusual complaints and feels muchj better after blood transfusion today.  She desires discharge.   Pain is controlled with current medications.   Patient is urinating without difficulty.  She is ambulating well.    Objective  Vital signs: BP 130/77 (BP Location: Left Arm)   Pulse 81   Temp 98.6 F (37 C) (Oral)   Resp 18   Ht 5\' 6"  (1.676 m)   Wt 260 lb (117.9 kg)   LMP 10/05/2016   SpO2 100%   Breastfeeding? Unknown   BMI 41.97 kg/m   Physical Exam: Gen: NAD Fundus Fundal Tone: Firm  Lochia Amount: Small  Perineum Appearance: Intact     Data Review Labs: CBC Latest Ref Rng & Units 07/03/2017 07/02/2017 07/02/2017  WBC 3.6 - 11.0 K/uL 9.2 17.9(H) 16.0(H)  Hemoglobin 12.0 - 16.0 g/dL 7.8(L) 9.2(L) 8.7(L)  Hematocrit 35.0 - 47.0 % 23.2(L) 26.9(L) 25.6(L)  Platelets 150 - 440 K/uL 178 204 192   O POS  Assessment/Plan  Active Problems:   Labor and delivery, indication for care    Plan for discharge today.   Discharge Instructions: Per After Visit Summary. Activity: Advance as tolerated. Pelvic rest for 6 weeks.  Also refer to After Visit Summary Diet: Regular Medications:  Outpatient follow up:  Follow-up Information    Linzie CollinEvans, Jabar Krysiak  James, MD Follow up in 1 week(s).   Specialty:  Obstetrics and Gynecology Contact information: [address] [place] [id number] [phone]          Postpartum contraception: discuss at next visit  Discharged Condition: good  Discharged to: home  Newborn Data: Disposition:Surrogate parents  Apgars: APGAR (1 MIN): 8   APGAR (5 MINS): 9   APGAR (10 MINS):    Baby Feeding: Bottle    Elonda Huskyavid J. Jozalyn Baglio, M.D. 07/03/2017 5:18 PM

## 2017-07-03 NOTE — Progress Notes (Signed)
Discharge instructions given. Patient verbalizes understanding of teaching. Patient discharged home at 1810.

## 2017-07-03 NOTE — Progress Notes (Signed)
Patient ID: Jennifer Best, female   DOB: 06-16-1991, 26 y.o.   MRN: [id number] Progress Note - Vaginal Delivery  Jennifer Best is a 26 y.o. [id number] now PP day 1 s/p Vaginal, Spontaneous Delivery  with subsequent PPH.  Subjective:  The patient reports that she has a headache and is very tired. She says she must move slowly to avoid lightheadedness.  Objective:  Vital signs in last 24 hours: Temp:  [97.9 F (36.6 C)-98.4 F (36.9 C)] 97.9 F (36.6 C) (08/07 0738) Pulse Rate:  [78-123] 89 (08/07 0738) Resp:  [18-20] 18 (08/07 0738) BP: (114-150)/(64-93) 133/69 (08/07 0738) SpO2:  [96 %-100 %] 97 % (08/07 0738)  Physical Exam:  General: alert, cooperative and pale Lochia: appropriate Uterine Fundus: firm DVT Evaluation: No evidence of DVT seen on physical exam.    Data Review  Recent Labs  07/02/17 1223 07/03/17 0411  HGB 9.2* 7.8*  HCT 26.9* 23.2*    Assessment/Plan: Active Problems:   Labor and delivery, indication for care   Patient with symptomatic anemia and a hemoglobin of 7.8. Her bleeding is now minimal but her acute blood loss from yesterday has resulted in her symptoms.  We have discussed options and the patient is interested in receiving a blood transfusion. I believe this is appropriate because of her symptomatic anemia.  Should she feel better later today after her transfusion she would like to be discharged.     Elonda Huskyavid J. Amanpreet Delmont, M.D. 07/03/2017 9:09 AM

## 2017-07-04 ENCOUNTER — Encounter: Payer: 59 | Admitting: Obstetrics and Gynecology

## 2017-07-04 LAB — TYPE AND SCREEN
ABO/RH(D): O POS
ANTIBODY SCREEN: NEGATIVE
UNIT DIVISION: 0

## 2017-07-04 LAB — BPAM RBC
BLOOD PRODUCT EXPIRATION DATE: 201808292359
ISSUE DATE / TIME: 201808070924
Unit Type and Rh: 5100

## 2017-07-06 ENCOUNTER — Encounter: Payer: 59 | Admitting: Obstetrics and Gynecology

## 2017-07-06 ENCOUNTER — Encounter: Payer: Self-pay | Admitting: Obstetrics and Gynecology

## 2017-07-06 ENCOUNTER — Other Ambulatory Visit: Payer: 59

## 2017-07-10 ENCOUNTER — Encounter: Payer: Self-pay | Admitting: Obstetrics and Gynecology

## 2017-07-10 ENCOUNTER — Ambulatory Visit (INDEPENDENT_AMBULATORY_CARE_PROVIDER_SITE_OTHER): Payer: 59 | Admitting: Obstetrics and Gynecology

## 2017-07-10 NOTE — Progress Notes (Signed)
HPI:      [name] is a 26 y.o. [id number] who LMP was No LMP recorded.  Subjective:   She presents today 1 week from vaginal delivery at which time she had a postpartum hemorrhage. This was a surrogate pregnancy that she has given up. She states she is currently doing fine requires no pain medicine has minimal bleeding and has returned to normal activities without feeling tired or run down. She is taking iron 3 times a day as directed.    Hx: The following portions of the patient's history were reviewed and updated as appropriate:             She  has a past medical history of Medical history non-contributory and Obesity (BMI 30-39.9). She  does not have any pertinent problems on file. She  has a past surgical history that includes Cholecystectomy; Unilateral salpingectomy (Left, 07/17/2016); and laparoscopy (Left, 07/17/2016). Her family history includes Breast cancer in her maternal grandmother; Diabetes in her maternal grandfather and paternal grandfather; Lung cancer in her mother and paternal grandmother; Prostate cancer in her paternal grandfather. She  reports that she has quit smoking. She has quit using smokeless tobacco. She reports that she does not drink alcohol or use drugs. She is allergic to morphine and related and latex.       Review of Systems:  Review of Systems  Constitutional: Denied constitutional symptoms, night sweats, recent illness, fatigue, fever, insomnia and weight loss.  Eyes: Denied eye symptoms, eye pain, photophobia, vision change and visual disturbance.  Ears/Nose/Throat/Neck: Denied ear, nose, throat or neck symptoms, hearing loss, nasal discharge, sinus congestion and sore throat.  Cardiovascular: Denied cardiovascular symptoms, arrhythmia, chest pain/pressure, edema, exercise intolerance, orthopnea and palpitations.  Respiratory: Denied pulmonary symptoms, asthma, pleuritic pain, productive sputum, cough, dyspnea and wheezing.  Gastrointestinal:  Denied, gastro-esophageal reflux, melena, nausea and vomiting.  Genitourinary: Denied genitourinary symptoms including symptomatic vaginal discharge, pelvic relaxation issues, and urinary complaints.  Musculoskeletal: Denied musculoskeletal symptoms, stiffness, swelling, muscle weakness and myalgia.  Dermatologic: Denied dermatology symptoms, rash and scar.  Neurologic: Denied neurology symptoms, dizziness, headache, neck pain and syncope.  Psychiatric: Denied psychiatric symptoms, anxiety and depression.  Endocrine: Denied endocrine symptoms including hot flashes and night sweats.   Meds:   No current outpatient prescriptions on file prior to visit.   No current facility-administered medications on file prior to visit.     Objective:     Vitals:   07/10/17 1330  BP: (!) 143/88  Pulse: 98                Assessment:    [id number] Patient Active Problem List   Diagnosis Date Noted  . Labor and delivery, indication for care 07/02/2017  . Gestational diabetes mellitus (GDM) in third trimester 06/30/2017  . Abnormal glucose tolerance test in pregnancy 06/05/2017  . Abnormal antenatal AFP screen   . Obesity (BMI 35.0-39.9 without comorbidity) 12/30/2016  . History of ectopic pregnancy 06/30/2016     1. Postpartum care and examination immediately after delivery     Patient doing well without problem. No signs or symptoms of anemia    Plan:            1.  Continue current management  2.  We began discussing birth control options. Risks benefits of each reviewed. Patient desires OCPs at next visit. Orders No orders of the defined types were placed in this encounter.    Meds ordered this encounter  Medications  .  ferrous sulfate 325 (65 FE) MG EC tablet    Sig: Take 325 mg by mouth 3 (three) times daily with meals.        F/U  Return in about 4 weeks (around 08/07/2017). I spent 15 minutes with this patient of which greater than 50% was spent discussing postpartum  recovery, postpartum hemorrhage, future visits and birth control at 6 week check.  Elonda Huskyavid J. Evans, M.D. 07/10/2017 2:07 PM

## 2017-07-11 ENCOUNTER — Ambulatory Visit: Payer: 59 | Admitting: Family Medicine

## 2017-07-11 ENCOUNTER — Encounter: Payer: 59 | Admitting: Obstetrics and Gynecology

## 2017-07-11 ENCOUNTER — Other Ambulatory Visit: Payer: 59

## 2017-08-05 ENCOUNTER — Emergency Department
Admission: EM | Admit: 2017-08-05 | Discharge: 2017-08-06 | Disposition: A | Discharge status: Home / Self Care | Payer: 59 | Attending: Emergency Medicine | Admitting: Emergency Medicine

## 2017-08-05 ENCOUNTER — Emergency Department: Payer: 59

## 2017-08-05 DIAGNOSIS — R0600 Dyspnea, unspecified: Secondary | ICD-10-CM | POA: Diagnosis not present

## 2017-08-05 DIAGNOSIS — R0602 Shortness of breath: Secondary | ICD-10-CM | POA: Diagnosis present

## 2017-08-05 DIAGNOSIS — Z91040 Latex allergy status: Secondary | ICD-10-CM | POA: Insufficient documentation

## 2017-08-05 DIAGNOSIS — Z79899 Other long term (current) drug therapy: Secondary | ICD-10-CM | POA: Insufficient documentation

## 2017-08-05 DIAGNOSIS — Z87891 Personal history of nicotine dependence: Secondary | ICD-10-CM | POA: Insufficient documentation

## 2017-08-05 LAB — TROPONIN I

## 2017-08-05 LAB — COMPREHENSIVE METABOLIC PANEL
ALBUMIN: 4.1 g/dL (ref 3.5–5.0)
ALT: 85 U/L — AB (ref 14–54)
AST: 50 U/L — AB (ref 15–41)
Alkaline Phosphatase: 113 U/L (ref 38–126)
Anion gap: 9 (ref 5–15)
BUN: 6 mg/dL (ref 6–20)
CHLORIDE: 106 mmol/L (ref 101–111)
CO2: 25 mmol/L (ref 22–32)
CREATININE: 0.64 mg/dL (ref 0.44–1.00)
Calcium: 9.1 mg/dL (ref 8.9–10.3)
GFR calc Af Amer: >60 mL/min (ref >60)
GLUCOSE: 108 mg/dL — AB (ref 65–99)
POTASSIUM: 4.4 mmol/L (ref 3.5–5.1)
SODIUM: 140 mmol/L (ref 135–145)
Total Bilirubin: 0.5 mg/dL (ref 0.3–1.2)
Total Protein: 7.5 g/dL (ref 6.5–8.1)

## 2017-08-05 LAB — CBC WITH DIFFERENTIAL/PLATELET
BASOS ABS: 0 10*3/uL (ref 0–0.1)
BASOS PCT: 0 %
EOS ABS: 0.3 10*3/uL (ref 0–0.7)
EOS PCT: 4 %
HCT: 32.3 % — ABNORMAL LOW (ref 35.0–47.0)
Hemoglobin: 10.9 g/dL — ABNORMAL LOW (ref 12.0–16.0)
LYMPHS PCT: 10 %
Lymphs Abs: 0.8 10*3/uL — ABNORMAL LOW (ref 1.0–3.6)
MCH: 26.7 pg (ref 26.0–34.0)
MCHC: 33.7 g/dL (ref 32.0–36.0)
MCV: 79.3 fL — ABNORMAL LOW (ref 80.0–100.0)
MONO ABS: 0.7 10*3/uL (ref 0.2–0.9)
Monocytes Relative: 9 %
Neutro Abs: 6.2 10*3/uL (ref 1.4–6.5)
Neutrophils Relative %: 77 %
PLATELETS: 263 10*3/uL (ref 150–440)
RBC: 4.07 MIL/uL (ref 3.80–5.20)
RDW: 15.1 % — ABNORMAL HIGH (ref 11.5–14.5)
WBC: 8 10*3/uL (ref 3.6–11.0)

## 2017-08-05 LAB — BRAIN NATRIURETIC PEPTIDE: B Natriuretic Peptide: 17 pg/mL (ref 0.0–100.0)

## 2017-08-05 MED ORDER — SODIUM CHLORIDE 0.9 % IV BOLUS (SEPSIS)
1000.0000 mL | INTRAVENOUS | Status: AC
  Administered 2017-08-05: 1000 mL via INTRAVENOUS

## 2017-08-05 MED ORDER — SODIUM CHLORIDE 0.9 % IV BOLUS (SEPSIS)
1000.0000 mL | Freq: Once | INTRAVENOUS | Status: AC
  Administered 2017-08-05: 1000 mL via INTRAVENOUS

## 2017-08-05 MED ORDER — METHYLPREDNISOLONE SODIUM SUCC 125 MG IJ SOLR
125.0000 mg | Freq: Once | INTRAMUSCULAR | Status: AC
  Administered 2017-08-05: 125 mg via INTRAVENOUS
  Filled 2017-08-05: qty 2

## 2017-08-05 MED ORDER — IPRATROPIUM-ALBUTEROL 0.5-2.5 (3) MG/3ML IN SOLN
3.0000 mL | Freq: Once | RESPIRATORY_TRACT | Status: AC
  Administered 2017-08-05: 3 mL via RESPIRATORY_TRACT
  Filled 2017-08-05: qty 3

## 2017-08-05 MED ORDER — IOPAMIDOL (ISOVUE-370) INJECTION 76%
75.0000 mL | Freq: Once | INTRAVENOUS | Status: AC | PRN
Start: 2017-08-05 — End: 2017-08-05
  Administered 2017-08-05: 75 mL via INTRAVENOUS

## 2017-08-05 NOTE — ED Notes (Signed)
Patient transported to CT 

## 2017-08-05 NOTE — ED Notes (Signed)
Pt provided with additional warm blankets.

## 2017-08-05 NOTE — ED Triage Notes (Signed)
Pt states that she started having increased diff breathing last night, pt is obviously distressed with her breathing.

## 2017-08-05 NOTE — ED Notes (Signed)
Pt in ct 

## 2017-08-05 NOTE — ED Provider Notes (Signed)
[facility] Emergency Department Provider Note  ____________________________________________   First MD Initiated Contact with Patient 08/05/17 2051     (approximate)  I have reviewed the triage vital signs and the nursing notes.   HISTORY  Chief Complaint Shortness of Breath    HPI Jennifer Best is a 26 y.o. female who has generally normal medical history but gave birth to a child about a month ago who presents by private vehicle for evaluation of difficulty breathing.  She reports that she started having breathing difficulties about 24 hours ago and it has gradually gotten worse.  It is now severe and she is not able to speak in complete sentences.  She feels like she has been wheezing a little bit but it has gotten very severe by tonight.  She is breathing rapidly and has an elevated heart rate in the 140s.  Her oxygen saturation was in the low 90s initially.  She denies having ever had blood clots in her legs or lungs.  She states that she had childhood asthma but has not had problems for years.  She denies chest pain, reports some back pain but believes it is due to frequent coughing since last night, denies headache and neck pain, denies abdominal pain, denies nausea and vomiting.  She also denies fever and chills and states that she has been in her normal state of health until last night.  Nothing in particular makes the patient's symptoms better nor worse.    Past Medical History:  Diagnosis Date  . Medical history non-contributory   . Obesity (BMI 30-39.9)     Patient Active Problem List   Diagnosis Date Noted  . Labor and delivery, indication for care 07/02/2017  . Gestational diabetes mellitus (GDM) in third trimester 06/30/2017  . Abnormal glucose tolerance test in pregnancy 06/05/2017  . Abnormal antenatal AFP screen   . Obesity (BMI 35.0-39.9 without comorbidity) 12/30/2016  . History of ectopic pregnancy 06/30/2016    Past Surgical History:    Procedure Laterality Date  . CHOLECYSTECTOMY    . LAPAROSCOPY Left 07/17/2016   Procedure: LAPAROSCOPY DIAGNOSTIC;  Surgeon: Hildred Laser, MD;  Location: [facility] ORS;  Service: Gynecology;  Laterality: Left;  removal ectopic pregnancy  . UNILATERAL SALPINGECTOMY Left 07/17/2016   Procedure: UNILATERAL SALPINGECTOMY;  Surgeon: Hildred Laser, MD;  Location: [facility] ORS;  Service: Gynecology;  Laterality: Left;    Prior to Admission medications   Medication Sig Start Date End Date Taking? Authorizing Provider  ferrous sulfate 325 (65 FE) MG EC tablet Take 325 mg by mouth 3 (three) times daily with meals.   Yes [provider]  albuterol (PROVENTIL HFA;VENTOLIN HFA) 108 (90 Base) MCG/ACT inhaler Inhale 2 puffs into the lungs every 6 (six) hours as needed for wheezing or shortness of breath. 08/06/17   Rebecka Apley, MD    Allergies Morphine and related and Latex  Family History  Problem Relation Age of Onset  . Lung cancer Mother   . Breast cancer Maternal Grandmother   . Lung cancer Paternal Grandmother   . Prostate cancer Paternal Grandfather   . Diabetes Paternal Grandfather   . Diabetes Maternal Grandfather   . Ovarian cancer Neg Hx   . Colon cancer Neg Hx     Social History Social History  Substance Use Topics  . Smoking status: Former Games developer  . Smokeless tobacco: Former Neurosurgeon  . Alcohol use No    Review of Systems Constitutional: No fever/chills Eyes: No visual changes.  ENT: No sore throat. Cardiovascular: Denies chest pain. Respiratory: Severe shortness of breath, gradually getting worse since last night Gastrointestinal: No abdominal pain.  No nausea, no vomiting.  No diarrhea.  No constipation. Genitourinary: Negative for dysuria. Musculoskeletal: Negative for neck pain.  Negative for back pain. Integumentary: Negative for rash. Neurological: Negative for headaches, focal weakness or numbness.   ____________________________________________   PHYSICAL  EXAM:  VITAL SIGNS: ED Triage Vitals  Enc Vitals Group     BP 08/05/17 2010 133/90     Pulse Rate 08/05/17 2010 (!) 145     Resp 08/05/17 2010 (!) 32     Temp 08/05/17 2010 99.1 F (37.3 C)     Temp Source 08/05/17 2010 Oral     SpO2 08/05/17 2010 95 %     Weight 08/05/17 2012 104.3 kg (230 lb)     Height 08/05/17 2012 1.651 m (5\' 5" )     Head Circumference --      Peak Flow --      Pain Score 08/05/17 2009 6     Pain Loc --      Pain Edu? --      Excl. in GC? --     Constitutional: Alert and oriented. Moderate respiratory distress. Eyes: Conjunctivae are normal.  Head: Atraumatic. Nose: No congestion/rhinnorhea. Mouth/Throat: Mucous membranes are moist. Neck: No stridor.  No meningeal signs.   Cardiovascular: Moderate tachycardia, regular rhythm. Good peripheral circulation. Grossly normal heart sounds. Respiratory: Increased respiratory effort with intercostal muscle usage and retractions.  Generally clear lung sounds throughout with only minimal wheezing and she does have good air movement although lung sounds may be slightly diminished on the right side Gastrointestinal: Obese.  Soft and nontender. No distention.  Musculoskeletal: No lower extremity tenderness nor edema. No gross deformities of extremities. Neurologic:  Normal speech and language. No gross focal neurologic deficits are appreciated.  Skin:  Skin is warm, dry and intact. No rash noted. Psychiatric: Mood and affect are normal. Speech and behavior are normal.  ____________________________________________   LABS (all labs ordered are listed, but only abnormal results are displayed)  Labs Reviewed  CBC WITH DIFFERENTIAL/PLATELET - Abnormal; Notable for the following:       Result Value   Hemoglobin 10.9 (*)    HCT 32.3 (*)    MCV 79.3 (*)    RDW 15.1 (*)    Lymphs Abs 0.8 (*)    All other components within normal limits  COMPREHENSIVE METABOLIC PANEL - Abnormal; Notable for the following:    Glucose,  Bld 108 (*)    AST 50 (*)    ALT 85 (*)    All other components within normal limits  TROPONIN I  BRAIN NATRIURETIC PEPTIDE   ____________________________________________  EKG  ED ECG REPORT I, Teleshia Lemere, the attending physician, personally viewed and interpreted this ECG.  Date: 08/05/2017 EKG Time: 20:21 Rate: 131 Rhythm: Sinus tachycardia QRS Axis: normal Intervals: normal ST/T Wave abnormalities: normal Narrative Interpretation: no evidence of acute ischemia  ____________________________________________  RADIOLOGY   Ct Angio Chest Pe W/cm &/or Wo Cm  Result Date: 08/05/2017 CLINICAL DATA:  PE suspected, high pretest prob. Difficulty breathing. EXAM: CT ANGIOGRAPHY CHEST WITH CONTRAST TECHNIQUE: Multidetector CT imaging of the chest was performed using the standard protocol during bolus administration of intravenous contrast. Multiplanar CT image reconstructions and MIPs were obtained to evaluate the vascular anatomy. CONTRAST:  75 cc Isovue 370 IV COMPARISON:  Radiographs earlier this day. FINDINGS: Cardiovascular: Patient motion  artifact sounds contrast bolus limit assessment, particularly in the lower lobes. No filling defects in the central pulmonary arteries to the distal lobar level to suggest pulmonary embolus. Upper lobe subsegmental branches opacify normally. Normal caliber thoracic aorta without dissection. The heart is normal in size. No pericardial effusion. Mediastinum/Nodes: No mediastinal or hilar adenopathy. Small hiatal hernia. Visualized thyroid gland is normal. Lungs/Pleura: Lower lobe evaluation motion obscured. No confluent consolidation. Mild central bronchial thickening. No pulmonary edema. No pleural fluid. 3 mm subpleural nodule in the right upper lobe is likely postinfectious or inflammatory in a patient of this age. Upper Abdomen: Liver and spleen are prominent in size. Musculoskeletal: There are no acute or suspicious osseous abnormalities. Review of  the MIP images confirms the above findings. IMPRESSION: 1. No central pulmonary embolus. Respiratory motion limits evaluation through the lower lobes. 2. Bronchial thickening.  No other acute abnormality. 3. Suggestion of hepatosplenomegaly in the upper abdomen, of uncertain acuity. Electronically Signed   By: Rubye Oaks M.D.   On: 08/05/2017 22:55   Dg Chest Portable 1 View  Result Date: 08/05/2017 CLINICAL DATA:  Severe shortness of breath. EXAM: PORTABLE CHEST 1 VIEW COMPARISON:  None. FINDINGS: The cardiomediastinal silhouette is within normal limits. The lungs are well inflated and clear. There is no evidence of pleural effusion or pneumothorax. No acute osseous abnormality is identified. IMPRESSION: No active disease. Electronically Signed   By: Sebastian Ache M.D.   On: 08/05/2017 21:11    ____________________________________________   PROCEDURES  Critical Care performed: No   Procedure(s) performed:   Procedures   ____________________________________________   INITIAL IMPRESSION / ASSESSMENT AND PLAN / ED COURSE  Pertinent labs & imaging results that were available during my care of the patient were reviewed by me and considered in my medical decision making (see chart for details).  I was called to the room as soon as she was seen by her nurse given concerns for pulmonary embolism and the moderate respiratory distress in general.  She told me that she has a history of childhood asthma and that this does feel similar so I started her on DuoNeb labs, ordered labs, and ordered a portable chest x-ray.  Given her delivery a month ago, I am concerned about the possibility of pulmonary embolism versus postpartum cardiomyopathy versus asthma exacerbation versus infectious process.  I will hold off on IV fluids until have a better idea if she has any pulmonary edema and hopefully she will feel better with the DuoNeb but I find it likely may proceed with CTA chest to rule out pulmonary  embolism.   Clinical Course as of Aug 06 1129  Wynelle Link Aug 05, 2017  2120 Normal CXR DG Chest Portable 1 View [CF]  2130 The patient states that she is feeling better now in terms of her breathing although now she is shaky and jittery.  Her heart rate is up in the 150s that she is actively using albuterol.  It is reassuring that she feels more comfortable and is breathing better on the albuterol, and with a normal chest x-ray and a normal BNP, I am much more reassured that she is not having postpartum cardiomyopathy issues with acute heart failure.  Given that, I will give her 1 L normal saline which may help with the tachycardia.  I am still worried about the possibility of pulmonary embolism, however, and she can use to have tachypnea and tachycardia that seems to be out of proportion with the degree of very minimal  wheezing heard upon auscultation.  Given the morbidity mortality of a missed pulmonary embolism in this recently postpartum patient, I will obtain a CTA of her chest to attempt to rule PE.  Do not feel a d-dimer would be of clinical utility in this case due to her high pretest probability and her postpartum, hyper estrogenic state, and the strong probability that a d-dimer will be elevated anyway and checking it will simply delay definitive evaluation  [CF]  Mon Aug 06, 2017  0000 CTA was reassuring without evidence of PE.  Hepatosplenomegaly of unknown significance.  Patient reports she is breathing better, but still occasionally breathes through pursed lips and is still tachy in the 120s.  Giving second liter of fluid.  Transferring care to Dr. Zenda Alpers to reassess after fluids and determine if patient needs admission or d/c and outpatient follow up.  [CF]    Clinical Course User Index [CF] Loleta Rose, MD    ____________________________________________  FINAL CLINICAL IMPRESSION(S) / ED DIAGNOSES  Final diagnoses:  Shortness of breath  Dyspnea, unspecified type     MEDICATIONS  GIVEN DURING THIS VISIT:  Medications  ipratropium-albuterol (DUONEB) 0.5-2.5 (3) MG/3ML nebulizer solution 3 mL (3 mLs Nebulization Given 08/05/17 2113)  ipratropium-albuterol (DUONEB) 0.5-2.5 (3) MG/3ML nebulizer solution 3 mL (3 mLs Nebulization Given 08/05/17 2112)  ipratropium-albuterol (DUONEB) 0.5-2.5 (3) MG/3ML nebulizer solution 3 mL (3 mLs Nebulization Given 08/05/17 2112)  sodium chloride 0.9 % bolus 1,000 mL (0 mLs Intravenous Stopped 08/05/17 2319)  iopamidol (ISOVUE-370) 76 % injection 75 mL (75 mLs Intravenous Contrast Given 08/05/17 2236)  methylPREDNISolone sodium succinate (SOLU-MEDROL) 125 mg/2 mL injection 125 mg (125 mg Intravenous Given 08/05/17 2309)  sodium chloride 0.9 % bolus 1,000 mL (0 mLs Intravenous Stopped 08/06/17 0124)  sodium chloride 0.9 % bolus 1,000 mL (0 mLs Intravenous Stopped 08/06/17 0413)     NEW OUTPATIENT MEDICATIONS STARTED DURING THIS VISIT:  Discharge Medication List as of 08/06/2017  5:24 AM      Discharge Medication List as of 08/06/2017  5:24 AM      Discharge Medication List as of 08/06/2017  5:24 AM       Note:  This document was prepared using Dragon voice recognition software and may include unintentional dictation errors.    Loleta Rose, MD 08/06/17 1130

## 2017-08-05 NOTE — ED Notes (Signed)
Pt denies needs. Pt remains on oxygen at 2lpm via [place]. Pt states she feels improved work of breathing. Skin normal color warm and dry. Pt with sinus tachycardia noted on monitor.

## 2017-08-05 NOTE — ED Notes (Signed)
Report from ashley, rn.  

## 2017-08-05 NOTE — ED Notes (Signed)
Pt 93% on room air; pt placed on 2L nasal cannula at this time.

## 2017-08-06 MED ORDER — SODIUM CHLORIDE 0.9 % IV BOLUS (SEPSIS)
1000.0000 mL | Freq: Once | INTRAVENOUS | Status: AC
  Administered 2017-08-06: 1000 mL via INTRAVENOUS

## 2017-08-06 MED ORDER — ALBUTEROL SULFATE HFA 108 (90 BASE) MCG/ACT IN AERS: 2.0000 | INHALATION_SPRAY | Freq: Four times a day (QID) | RESPIRATORY_TRACT | 0 refills | Status: DC | PRN

## 2017-08-06 NOTE — Discharge Instructions (Signed)
Your work up at this time is negative. Your heart rate is improved as well. PLease follow up with your primary care physician for further evaluation of the symptoms.

## 2017-08-06 NOTE — ED Notes (Signed)
Pt up to restroom to void, pt updated on treatment plan. Pt verbalizes understanding.

## 2017-08-06 NOTE — ED Notes (Signed)
Pt assisted up to commode to urinate.  

## 2017-08-06 NOTE — ED Provider Notes (Signed)
-----------------------------------------   4:47 AM on 08/06/2017 -----------------------------------------   Blood pressure (!) 106/58, pulse 98, temperature 99.1 F (37.3 C), temperature source Oral, resp. rate 16, height 5\' 5"  (1.651 m), weight 104.3 kg (230 lb), SpO2 94 %, unknown if currently breastfeeding.  Assuming care from Dr. York CeriseFOrbach.  In short, Jennifer Best is a 26 y.o. female with a chief complaint of Shortness of Breath .  Refer to the original H&P for additional details.  The current plan of care is to monitor and reassess the patient..   Clinical Course as of Aug 06 446  Wynelle LinkSun Aug 05, 2017  2120 Normal CXR DG Chest Portable 1 View [CF]  2130 The patient states that she is feeling better now in terms of her breathing although now she is shaky and jittery.  Her heart rate is up in the 150s that she is actively using albuterol.  It is reassuring that she feels more comfortable and is breathing better on the albuterol, and with a normal chest x-ray and a normal BNP, I am much more reassured that she is not having postpartum cardiomyopathy issues with acute heart failure.  Given that, I will give her 1 L normal saline which may help with the tachycardia.  I am still worried about the possibility of pulmonary embolism, however, and she can use to have tachypnea and tachycardia that seems to be out of proportion with the degree of very minimal wheezing heard upon auscultation.  Given the morbidity mortality of a missed pulmonary embolism in this recently postpartum patient, I will obtain a CTA of her chest to attempt to rule PE.  Do not feel a d-dimer would be of clinical utility in this case due to her high pretest probability and her postpartum, hyper estrogenic state, and the strong probability that a d-dimer will be elevated anyway and checking it will simply delay definitive evaluation  [CF]    Clinical Course User Index [CF] Loleta RoseForbach, Cory, MD   I did monitor the patient for multiple  hours in the emergency department. She had received 2 L of normal saline with Dr. York CeriseForbach and I did give the patient 1 more liter of normal saline. After some time the patient's heart rate did improve back to the 90s. She still feels improved and her shortness of breath is improved. She will be discharged to home to follow-up with her primary care physician. The patient has no further concerns.   Rebecka ApleyWebster, Allison P, MD 08/06/17 [phone]

## 2017-08-06 NOTE — ED Notes (Signed)
Pt updated on treatment plan. Pt provided with meal tray.

## 2017-08-08 ENCOUNTER — Ambulatory Visit (INDEPENDENT_AMBULATORY_CARE_PROVIDER_SITE_OTHER): Payer: 59 | Admitting: Family

## 2017-08-08 ENCOUNTER — Encounter: Payer: Self-pay | Admitting: Family

## 2017-08-08 VITALS — BP 134/78 | HR 82 | Temp 97.9°F | Ht 65.0 in | Wt 240.6 lb

## 2017-08-08 DIAGNOSIS — J40 Bronchitis, not specified as acute or chronic: Secondary | ICD-10-CM

## 2017-08-08 DIAGNOSIS — Z7689 Persons encountering health services in other specified circumstances: Secondary | ICD-10-CM | POA: Diagnosis not present

## 2017-08-08 MED ORDER — PREDNISONE 10 MG PO TABS: ORAL_TABLET | ORAL | 0 refills | Status: DC

## 2017-08-08 NOTE — Patient Instructions (Addendum)
Mucinex.   Prednisone taper if you should need. Start in morning  Please let me know if you do not start to slowly use albuterol inhaler less and less . If you find that you continue to need your albuterol inhaler a couple times a day, I would like to start you on a medication such as Symbicort  Please follow up at your convenience with a routine physical exam  Let me know if you are not better

## 2017-08-08 NOTE — Assessment & Plan Note (Signed)
Reviewed PMH. Patient will return for CPE.

## 2017-08-08 NOTE — Progress Notes (Signed)
Subjective:    Patient ID: Jennifer Best, female    DOB: 28-Dec-1990, 26 y.o.   MRN: [id number]  CC: Jennifer Best is a 26 y.o. female who presents today to establish care.    HPI: SOB  Started 5 days started, improved.  improved with albuterol inhaler, using approx every 4 hours.  Palpitations have resolved as well.  No chest pain, leg swelling. Every one in while has wheeze and productive cough, both improving. Coughing at bedtime. Denies exertional chest pain or pressure, numbness or tingling radiating to left arm or jaw, palpitations, dizziness, frequent headaches, changes in vision.  H/o asthma as child.   Quit smoking 2 years ago.     ED 08/05/2017 SOB, tachycardia CXR- no acute findings to suggest postpartum cardiomyopathy CT angio- negative PE. Bronchial thickening. Suggestion of hepatosplenomegaly  Birth one month ago HISTORY:  Past Medical History:  Diagnosis Date  . Asthma   . Medical history non-contributory   . Obesity (BMI 30-39.9)    Past Surgical History:  Procedure Laterality Date  . CHOLECYSTECTOMY    . LAPAROSCOPY Left 07/17/2016   Procedure: LAPAROSCOPY DIAGNOSTIC;  Surgeon: Hildred Laser, MD;  Location: [facility] ORS;  Service: Gynecology;  Laterality: Left;  removal ectopic pregnancy  . UNILATERAL SALPINGECTOMY Left 07/17/2016   Procedure: UNILATERAL SALPINGECTOMY;  Surgeon: Hildred Laser, MD;  Location: [facility] ORS;  Service: Gynecology;  Laterality: Left;   Family History  Problem Relation Age of Onset  . Lung cancer Mother   . Breast cancer Maternal Grandmother   . Lung cancer Paternal Grandmother   . Prostate cancer Paternal Grandfather   . Diabetes Paternal Grandfather   . Diabetes Maternal Grandfather   . Ovarian cancer Neg Hx   . Colon cancer Neg Hx     Allergies: Morphine and related and Latex Current Outpatient Prescriptions on File Prior to Visit  Medication Sig Dispense Refill  . albuterol (PROVENTIL HFA;VENTOLIN HFA) 108 (90 Base) MCG/ACT inhaler  Inhale 2 puffs into the lungs every 6 (six) hours as needed for wheezing or shortness of breath. 1 Inhaler 0  . ferrous sulfate 325 (65 FE) MG EC tablet Take 325 mg by mouth 3 (three) times daily with meals.     No current facility-administered medications on file prior to visit.     Social History  Substance Use Topics  . Smoking status: Former Games developer  . Smokeless tobacco: Former Neurosurgeon  . Alcohol use No    Review of Systems  Constitutional: Negative for chills and fever.  HENT: Positive for congestion. Negative for sinus pain and sore throat.   Respiratory: Positive for cough and wheezing. Negative for shortness of breath.   Cardiovascular: Negative for chest pain, palpitations and leg swelling.  Gastrointestinal: Negative for nausea and vomiting.      Objective:    BP 134/78   Pulse 82   Temp 97.9 F (36.6 C) (Oral)   Ht  (1.651 m)   Wt 240 lb 9.6 oz (109.1 kg)   SpO2 93%   BMI 40.04 kg/m  BP Readings from Last 3 Encounters:  08/08/17 134/78  08/06/17 (!) 110/59  07/10/17 (!) 143/88   Wt Readings from Last 3 Encounters:  08/08/17 240 lb 9.6 oz (109.1 kg)  08/05/17 230 lb (104.3 kg)  07/10/17 243 lb 3 oz (110.3 kg)    Physical Exam  Constitutional: She appears well-developed and well-nourished.  Eyes: Conjunctivae are normal.  Cardiovascular: Normal rate, regular rhythm, normal heart sounds and normal pulses.  Pulmonary/Chest: Effort normal and breath sounds normal. She has no wheezes. She has no rhonchi. She has no rales.  Neurological: She is alert.  Skin: Skin is warm and dry.  Psychiatric: She has a normal mood and affect. Her speech is normal and behavior is normal. Thought content normal.  Vitals reviewed.      Assessment & Plan:   Problem List Items Addressed This Visit      Respiratory   Bronchitis - Primary    Patient is afebrile. She is well-appearing. She's not tachycardic. No adventitious lung sounds heard on exam. Everything seems tohave  calm down since patient was seen emergency room. She is using her rescue  inhaler quite frequently. Discussed starting a prednisone taper pack to see if this would help further calm symptoms down. Also discussed the role of the medications as Symbicort, Advair this patient is a history of asthma. Patient declines an ICS inhaler today. She will let me know if she does not continue to improve.      Relevant Medications   predniSONE (DELTASONE) 10 MG tablet     Other   Encounter to establish care    Reviewed PMH. Patient will return for CPE.          I am having Ms. Braithwaite start on predniSONE. I am also having her maintain her ferrous sulfate and albuterol.   Meds ordered this encounter  Medications  . predniSONE (DELTASONE) 10 MG tablet    Sig: Take 40 mg by mouth on day 1, then taper 10 mg daily until gone    Dispense:  10 tablet    Refill:  0    Order Specific Question:   Supervising Provider    Answer:   Sherlene ShamsULLO, TERESA L [[id number]]    Return precautions given.   Risks, benefits, and alternatives of the medications and treatment plan prescribed today were discussed, and patient expressed understanding.   Education regarding symptom management and diagnosis given to patient on AVS.  Continue to follow with Allegra GranaArnett, Yatziri Wainwright G, FNP for routine health maintenance.   Janaia Pines and I agreed with plan.   Rennie PlowmanMargaret Demetrick Eichenberger, FNP

## 2017-08-08 NOTE — Assessment & Plan Note (Signed)
Patient is afebrile. She is well-appearing. She's not tachycardic. No adventitious lung sounds heard on exam. Everything seems tohave calm down since patient was seen emergency room. She is using her rescue  inhaler quite frequently. Discussed starting a prednisone taper pack to see if this would help further calm symptoms down. Also discussed the role of the medications as Symbicort, Advair this patient is a history of asthma. Patient declines an ICS inhaler today. She will let me know if she does not continue to improve.

## 2017-08-09 ENCOUNTER — Encounter: Payer: Self-pay | Admitting: Obstetrics and Gynecology

## 2017-08-09 ENCOUNTER — Ambulatory Visit (INDEPENDENT_AMBULATORY_CARE_PROVIDER_SITE_OTHER): Payer: 59 | Admitting: Obstetrics and Gynecology

## 2017-08-09 VITALS — BP 157/88 | HR 87 | Ht 65.0 in | Wt 239.2 lb

## 2017-08-09 DIAGNOSIS — Z3009 Encounter for other general counseling and advice on contraception: Secondary | ICD-10-CM

## 2017-08-09 DIAGNOSIS — Z30011 Encounter for initial prescription of contraceptive pills: Secondary | ICD-10-CM

## 2017-08-09 MED ORDER — DESOGESTREL-ETHINYL ESTRADIOL 0.15-0.02/0.01 MG (21/5) PO TABS: 1.0000 | ORAL_TABLET | Freq: Every day | ORAL | 2 refills | Status: DC

## 2017-08-09 NOTE — Progress Notes (Signed)
HPI:      [name] is a 26 y.o. [id number] who LMP was No LMP recorded.  Subjective:   She presents today 6 weeks postop from vaginal delivery. 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 Her delivery was complicated by postpartum hemorrhage. She reports no dizziness no problems from this. She is taking iron daily. She has not had a menstrual period yet. She has resumed intercourse without problem. She desires birth control.    Hx: The following portions of the patient's history were reviewed and updated as appropriate:             She  has a past medical history of Asthma; Medical history non-contributory; and Obesity (BMI 30-39.9). She  does not have any pertinent problems on file. She  has a past surgical history that includes Cholecystectomy; Unilateral salpingectomy (Left, 07/17/2016); and laparoscopy (Left, 07/17/2016). Her family history includes Breast cancer in her maternal grandmother; Diabetes in her maternal grandfather and paternal grandfather; Lung cancer in her mother and paternal grandmother; Prostate cancer in her paternal grandfather. She  reports that she has quit smoking. She has quit using smokeless tobacco. She reports that she does not drink alcohol or use drugs. She is allergic to morphine and related and latex.       Review of Systems:  Review of Systems  Constitutional: Denied constitutional symptoms, night sweats, recent illness, fatigue, fever, insomnia and weight loss.  Eyes: Denied eye symptoms, eye pain, photophobia, vision change and visual disturbance.  Ears/Nose/Throat/Neck: Denied ear, nose, throat or neck symptoms, hearing loss, nasal discharge, sinus congestion and sore throat.  Cardiovascular: Denied cardiovascular symptoms, arrhythmia, chest pain/pressure, edema, exercise intolerance, orthopnea and palpitations.  Respiratory: Denied pulmonary symptoms, asthma, pleuritic pain, productive sputum, cough, dyspnea and wheezing.  Gastrointestinal: Denied, gastro-esophageal  reflux, melena, nausea and vomiting.  Genitourinary: Denied genitourinary symptoms including symptomatic vaginal discharge, pelvic relaxation issues, and urinary complaints.  Musculoskeletal: Denied musculoskeletal symptoms, stiffness, swelling, muscle weakness and myalgia.  Dermatologic: Denied dermatology symptoms, rash and scar.  Neurologic: Denied neurology symptoms, dizziness, headache, neck pain and syncope.  Psychiatric: Denied psychiatric symptoms, anxiety and depression.  Endocrine: Denied endocrine symptoms including hot flashes and night sweats.   Meds:   Current Outpatient Prescriptions on File Prior to Visit  Medication Sig Dispense Refill  . albuterol (PROVENTIL HFA;VENTOLIN HFA) 108 (90 Base) MCG/ACT inhaler Inhale 2 puffs into the lungs every 6 (six) hours as needed for wheezing or shortness of breath. 1 Inhaler 0  . ferrous sulfate 325 (65 FE) MG EC tablet Take 325 mg by mouth 3 (three) times daily with meals.    . predniSONE (DELTASONE) 10 MG tablet Take 40 mg by mouth on day 1, then taper 10 mg daily until gone 10 tablet 0   No current facility-administered medications on file prior to visit.     Objective:     Vitals:   08/09/17 1009  BP: (!) 157/88  Pulse: 87              Pelvic examination   Pelvic:   Vulva: Small area of labial swelling on the right possibly early Bartholin's gland cyst.   No lesions.  No abnormal scarring.    Vagina: No lesions or abnormalities noted.  Support: Normal pelvic support.  Urethra No masses tenderness or scarring.  Meatus Normal size without lesions or prolapse.  Cervix: Normal ectropion.  No lesions.  Anus: Normal exam.  No lesions.  Perineum: Normal exam.  No lesions.  Healed  well.          Bimanual   Uterus: Normal size.  Non-tender.  Mobile.  AV.  Adnexae: No masses.  Non-tender to palpation.  Cul-de-sac: Negative for abnormality.     Assessment:    [id number] Patient Active Problem List   Diagnosis Date Noted  .  Bronchitis 08/08/2017  . Encounter to establish care 08/08/2017  . Gestational diabetes mellitus (GDM) in third trimester 06/30/2017  . Obesity (BMI 35.0-39.9 without comorbidity) 12/30/2016  . History of ectopic pregnancy 06/30/2016     1. Birth control counseling   2. Initiation of OCP (BCP)   3. Postpartum care and examination immediately after delivery     Possible early Bartholin's gland cyst of the vulva. Patient did not know it was there is not symptomatic.   Plan:            1.  Patient may resume normal activities with exception lifting.  2.  Management of fourth and cyst discussed in detail should it occur.  3.  OCPs The risks /benefits of OCPs have been explained to the patient in detail.  Product literature has been given to her.  I have instructed her in the use of OCPs and have given her literature reinforcing this information.  I have explained to the patient that OCPs are not as effective for birth control during the first month of use, and that another form of contraception should be used during this time.  Both first-day start and Sunday start have been explained.  The risks and benefits of each was discussed.  She has been made aware of  the fact that other medications may affect the efficacy of OCPs.  I have answered all of her questions, and I believe that she has an understanding of the effectiveness and use of OCPs. Patient will begin with next menses. Orders No orders of the defined types were placed in this encounter.    Meds ordered this encounter  Medications  . desogestrel-ethinyl estradiol (MIRCETTE) 0.15-0.02/0.01 MG (21/5) tablet    Sig: Take 1 tablet by mouth at bedtime.    Dispense:  1 Package    Refill:  2      F/U  Return in about 3 months (around 11/08/2017).  Jennifer Best, M.D. 08/09/2017 10:56 AM

## 2017-09-06 ENCOUNTER — Encounter: Payer: Self-pay | Admitting: Obstetrics and Gynecology

## 2017-09-06 ENCOUNTER — Other Ambulatory Visit: Payer: Self-pay

## 2017-09-06 MED ORDER — MEDROXYPROGESTERONE ACETATE 10 MG PO TABS: 10.0000 mg | ORAL_TABLET | Freq: Every day | ORAL | 0 refills | Status: DC

## 2017-09-19 IMAGING — US US OB TRANSVAGINAL
1 of 2 series · 13 of 28 positions shown · non-contrast
Comparison: None.

CLINICAL DATA: 25-year-old female with positive HCG levels
presenting with evaluation for pregnancy.

EXAM:
OBSTETRIC <14 WK US AND TRANSVAGINAL OB US
TECHNIQUE: Both transabdominal and transvaginal ultrasound examinations were
performed for complete evaluation of the gestation as well as the
maternal uterus, adnexal regions, and pelvic cul-de-sac.
Transvaginal technique was performed to assess early pregnancy.

[Series 1: us ob transvaginal · 0.26mm/px · 13 of 94 slices shown]
[im 4/94]
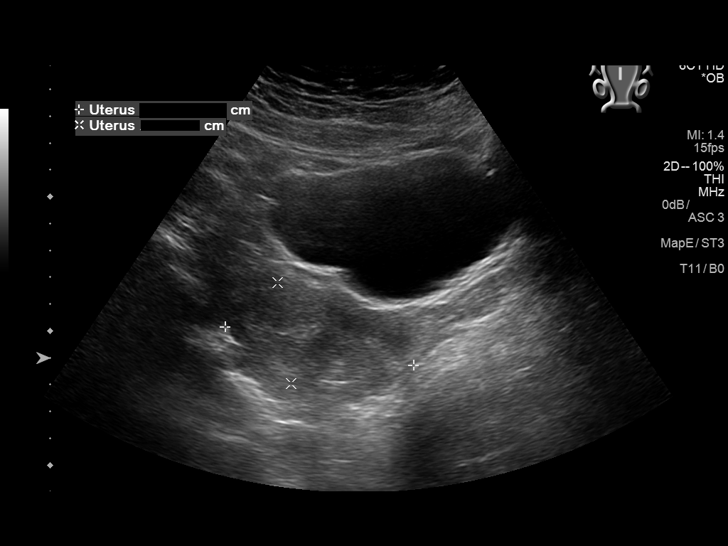
[im 11/94]
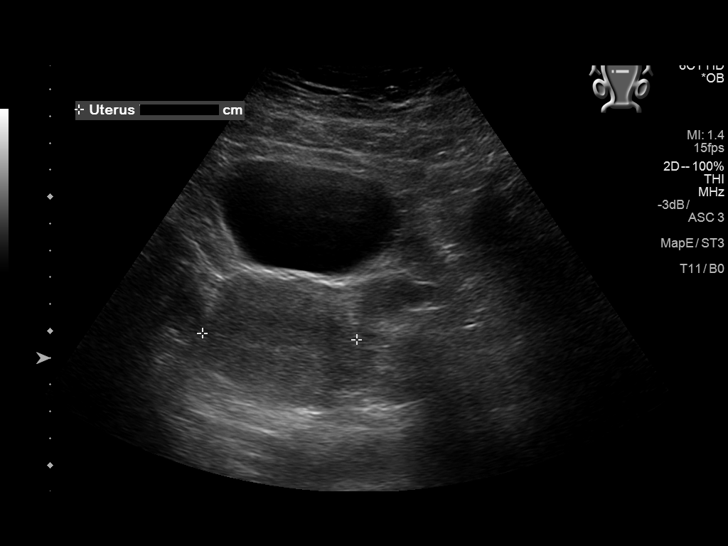
[im 18/94]
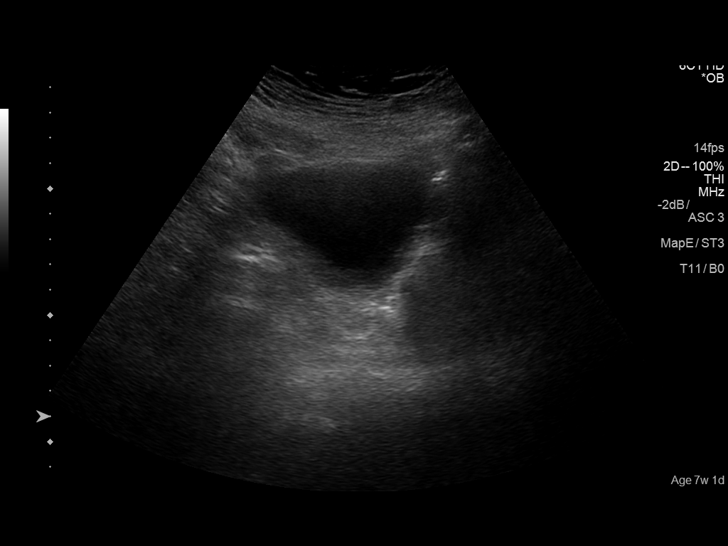
[im 26/94]
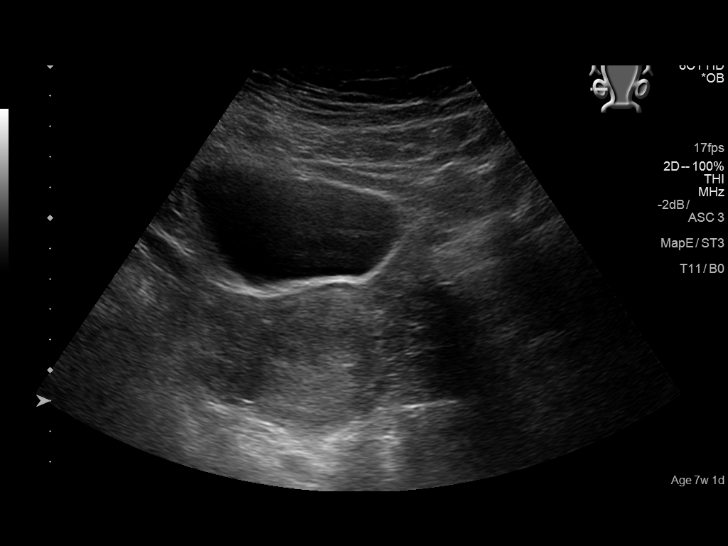
[im 33/94]
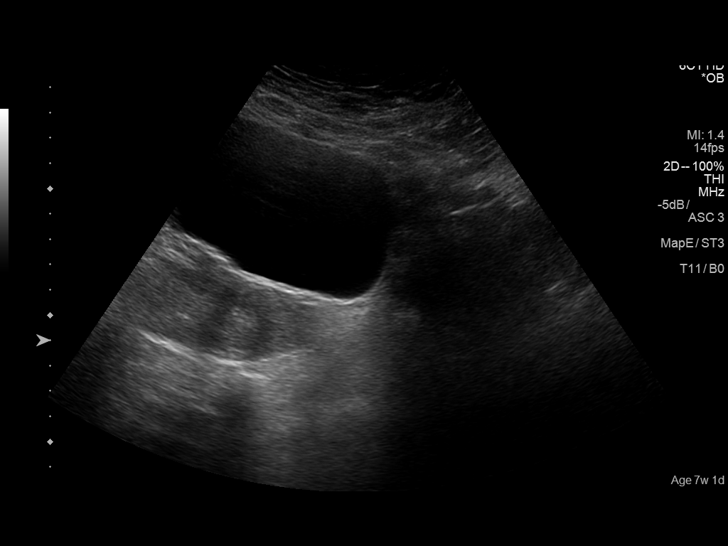
[im 40/94]
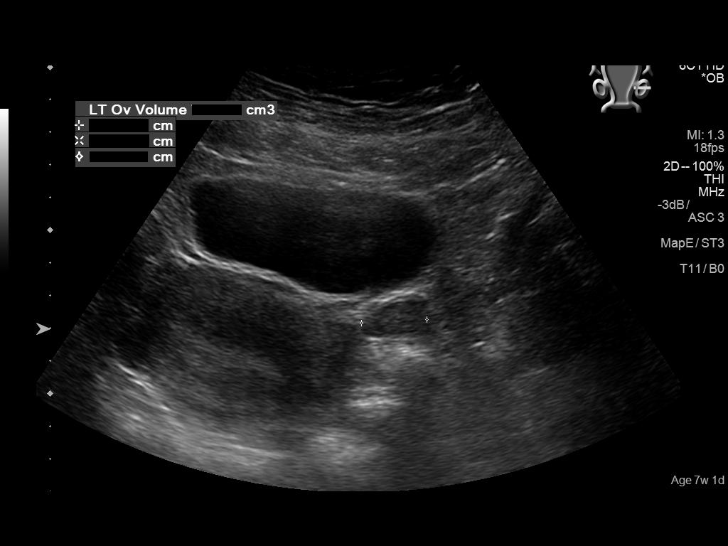
[im 51/94]
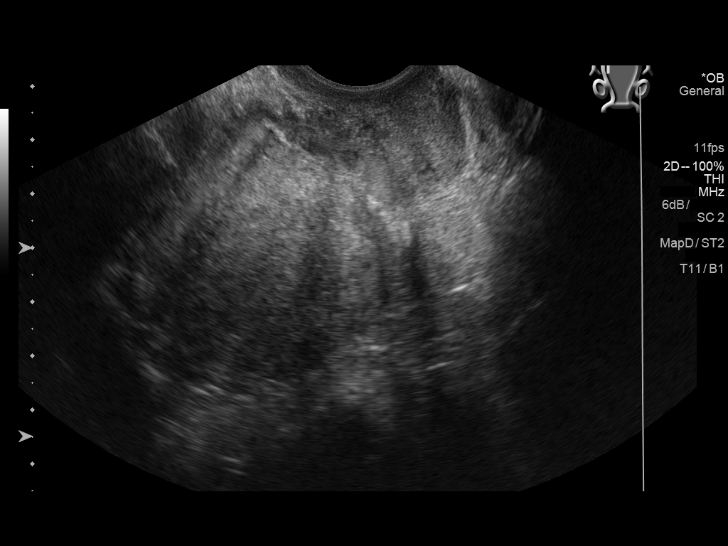
[im 58/94]
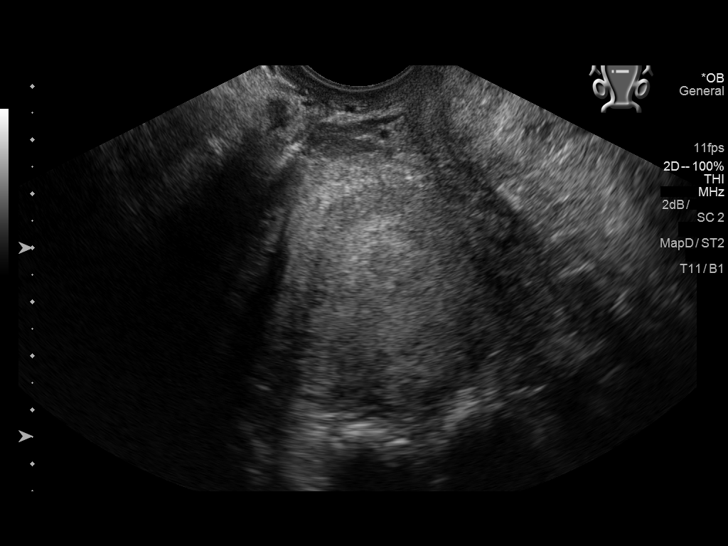
[im 65/94]
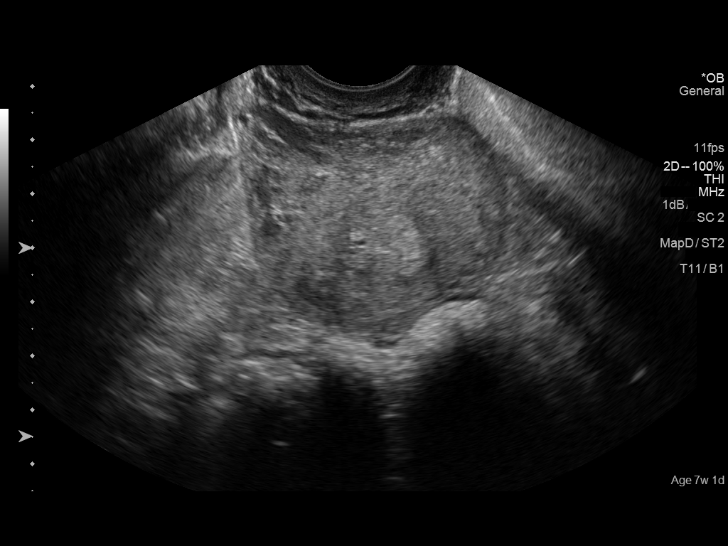
[im 72/94]
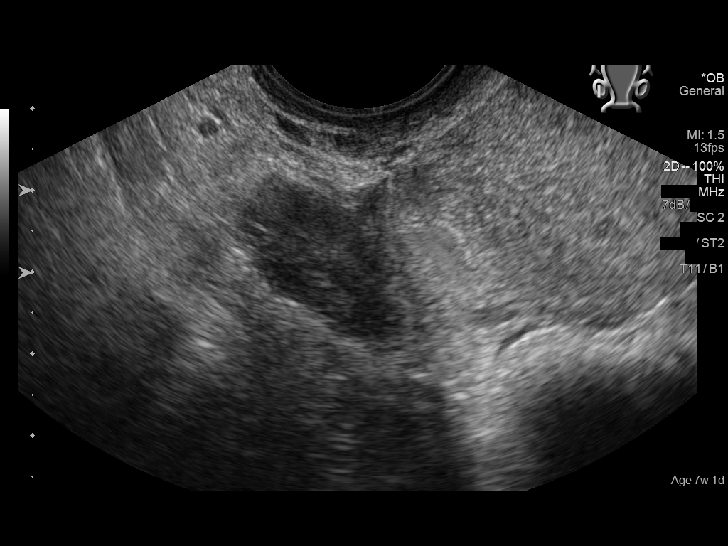
[im 79/94]
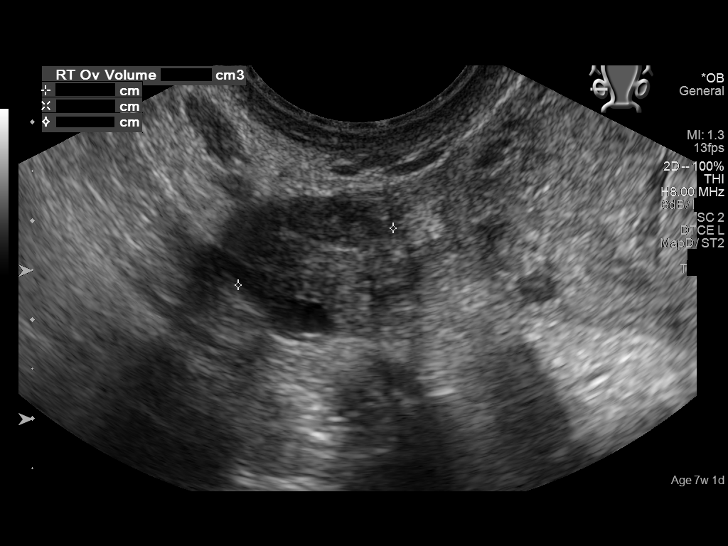
[im 86/94]
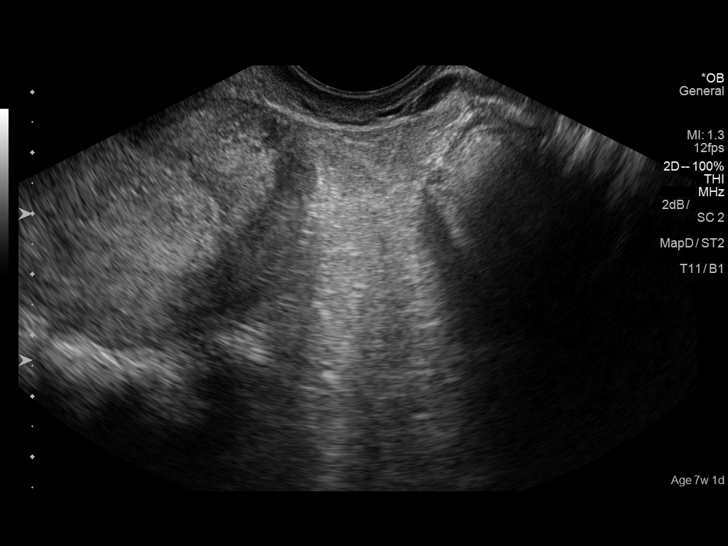
[im 94/94]
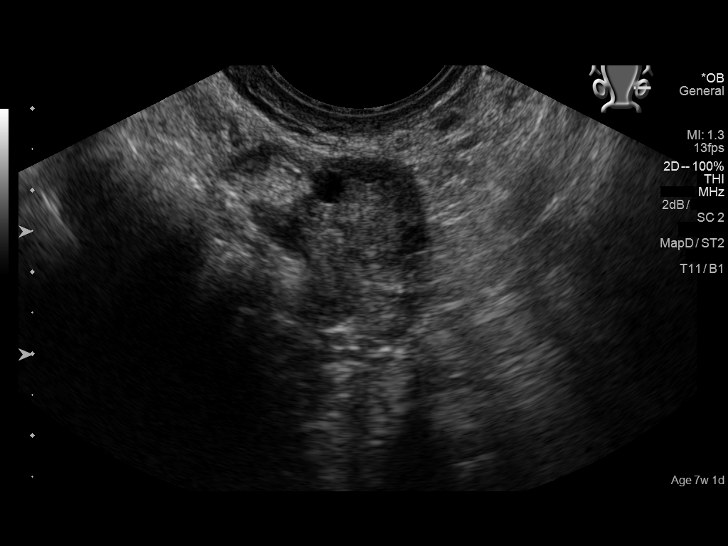

[13 of 28 positions shown; findings below may reference images not displayed]

FINDINGS: The uterus is retroverted and appears heterogeneous. There is an
irregular fluid collection in the lower uterine canal. No fetal pole
or yolk sac identified within this fluid collection. This may
represent an abnormal gestational sac, a blighted ovum. A pseudo
gestation of an ectopic pregnancy is not excluded.

If this fluid collection is a true gestational sac the estimated
gestational age based on mean sac diameter of 11 mm is 5 weeks, 6
days.

No subchorionic hemorrhage identified. The maternal ovaries appear
unremarkable.

No free fluid noted within pelvis
IMPRESSION: Irregular in fluid collection in the lower endometrium may represent
an abnormal gestational sac or a blighted ovum. No fetal pole or
yolk sac identified. Correlation with clinical exam and follow-up
with serial HCG levels and ultrasound recommended.

## 2017-10-14 ENCOUNTER — Encounter: Payer: Self-pay | Admitting: Obstetrics and Gynecology

## 2017-10-15 ENCOUNTER — Telehealth: Payer: Self-pay

## 2017-10-15 DIAGNOSIS — Z30011 Encounter for initial prescription of contraceptive pills: Secondary | ICD-10-CM

## 2017-10-15 MED ORDER — DESOGESTREL-ETHINYL ESTRADIOL 0.15-0.02/0.01 MG (21/5) PO TABS: 1.0000 | ORAL_TABLET | Freq: Every day | ORAL | 2 refills | Status: DC

## 2017-10-15 NOTE — Telephone Encounter (Signed)
Refill done.  

## 2017-11-08 ENCOUNTER — Encounter: Payer: Self-pay | Admitting: Obstetrics and Gynecology

## 2017-11-08 ENCOUNTER — Ambulatory Visit (INDEPENDENT_AMBULATORY_CARE_PROVIDER_SITE_OTHER): Payer: 59 | Admitting: Obstetrics and Gynecology

## 2017-11-08 VITALS — BP 123/79 | HR 76 | Ht 65.0 in | Wt 225.5 lb

## 2017-11-08 DIAGNOSIS — Z3041 Encounter for surveillance of contraceptive pills: Secondary | ICD-10-CM

## 2017-11-08 MED ORDER — DESOGESTREL-ETHINYL ESTRADIOL 0.15-0.02/0.01 MG (21/5) PO TABS: 1.0000 | ORAL_TABLET | Freq: Every day | ORAL | 2 refills | Status: DC

## 2017-11-08 NOTE — Addendum Note (Signed)
Addended by: Elonda HuskyEVANS, DAVID J on: 11/08/2017 11:18 AM   Modules accepted: Orders

## 2017-11-08 NOTE — Progress Notes (Signed)
HPI:      Jennifer Best is a 26 y.o. [phone] who LMP was Patient's last menstrual period was 10/20/2017 (exact date).  Subjective:   She presents today for follow-up of her OCPs.  She reports that she is taking them regularly and having normal cycles.  She has resumed intercourse without problem.  She reports no other issues at this time.  She does say that she is occasionally more emotional than usual but goes on to say that "this is not a problem for her."    Hx: The following portions of the patient's history were reviewed and updated as appropriate:             She  has a past medical history of Asthma, Medical history non-contributory, and Obesity (BMI 30-39.9). She does not have any pertinent problems on file. She  has a past surgical history that includes Cholecystectomy; Unilateral salpingectomy (Left, 07/17/2016); and laparoscopy (Left, 07/17/2016). Her family history includes Breast cancer in her maternal grandmother; Diabetes in her maternal grandfather and paternal grandfather; Lung cancer in her mother and paternal grandmother; Prostate cancer in her paternal grandfather. She  reports that she has quit smoking. She has quit using smokeless tobacco. She reports that she does not drink alcohol or use drugs. She is allergic to morphine and related and latex.       Review of Systems:  Review of Systems  Constitutional: Denied constitutional symptoms, night sweats, recent illness, fatigue, fever, insomnia and weight loss.  Eyes: Denied eye symptoms, eye pain, photophobia, vision change and visual disturbance.  Ears/Nose/Throat/Neck: Denied ear, nose, throat or neck symptoms, hearing loss, nasal discharge, sinus congestion and sore throat.  Cardiovascular: Denied cardiovascular symptoms, arrhythmia, chest pain/pressure, edema, exercise intolerance, orthopnea and palpitations.  Respiratory: Denied pulmonary symptoms, asthma, pleuritic pain, productive sputum, cough, dyspnea and  wheezing.  Gastrointestinal: Denied, gastro-esophageal reflux, melena, nausea and vomiting.  Genitourinary: Denied genitourinary symptoms including symptomatic vaginal discharge, pelvic relaxation issues, and urinary complaints.  Musculoskeletal: Denied musculoskeletal symptoms, stiffness, swelling, muscle weakness and myalgia.  Dermatologic: Denied dermatology symptoms, rash and scar.  Neurologic: Denied neurology symptoms, dizziness, headache, neck pain and syncope.  Psychiatric: Denied psychiatric symptoms, anxiety and depression.  Endocrine: Denied endocrine symptoms including hot flashes and night sweats.   Meds:   Current Outpatient Medications on File Prior to Visit  Medication Sig Dispense Refill  . albuterol (PROVENTIL HFA;VENTOLIN HFA) 108 (90 Base) MCG/ACT inhaler Inhale 2 puffs into the lungs every 6 (six) hours as needed for wheezing or shortness of breath. 1 Inhaler 0  . desogestrel-ethinyl estradiol (KARIVA,AZURETTE,MIRCETTE) 0.15-0.02/0.01 MG (21/5) tablet Take 1 tablet by mouth daily.     No current facility-administered medications on file prior to visit.     Objective:     Vitals:   11/08/17 1050  BP: 123/79  Pulse: 76                Assessment:    [id number] Patient Active Problem List   Diagnosis Date Noted  . Bronchitis 08/08/2017  . Encounter to establish care 08/08/2017  . Gestational diabetes mellitus (GDM) in third trimester 06/30/2017  . Obesity (BMI 35.0-39.9 without comorbidity) 12/30/2016  . History of ectopic pregnancy 06/30/2016     1. Encounter for birth control pills maintenance     Patient doing well without issue.   Plan:            1.  Continue OCPs.  2.  Annual exam and Pap due after  January 31. Orders No orders of the defined types were placed in this encounter.   No orders of the defined types were placed in this encounter.     F/U  Return in about 2 months (around 01/09/2018) for Annual Physical. I spent 11 minutes with  this patient of which greater than 50% was spent discussing OCPs and postpartum course.  Elonda Huskyavid J. Evans, M.D. 11/08/2017 11:16 AM

## 2017-11-09 ENCOUNTER — Telehealth: Payer: Self-pay

## 2017-11-09 NOTE — Telephone Encounter (Signed)
Pharmacy sent fax stating ins companies will only cover 90 supply of OCP instead of 30. Authorization given to fill 90 day supply.

## 2017-11-25 IMAGING — DX DG CHEST 1V PORT
1 series · 1 of 1 positions shown · non-contrast
Comparison: None.

CLINICAL DATA: Severe shortness of breath.

EXAM:
PORTABLE CHEST 1 VIEW

[chest ap]
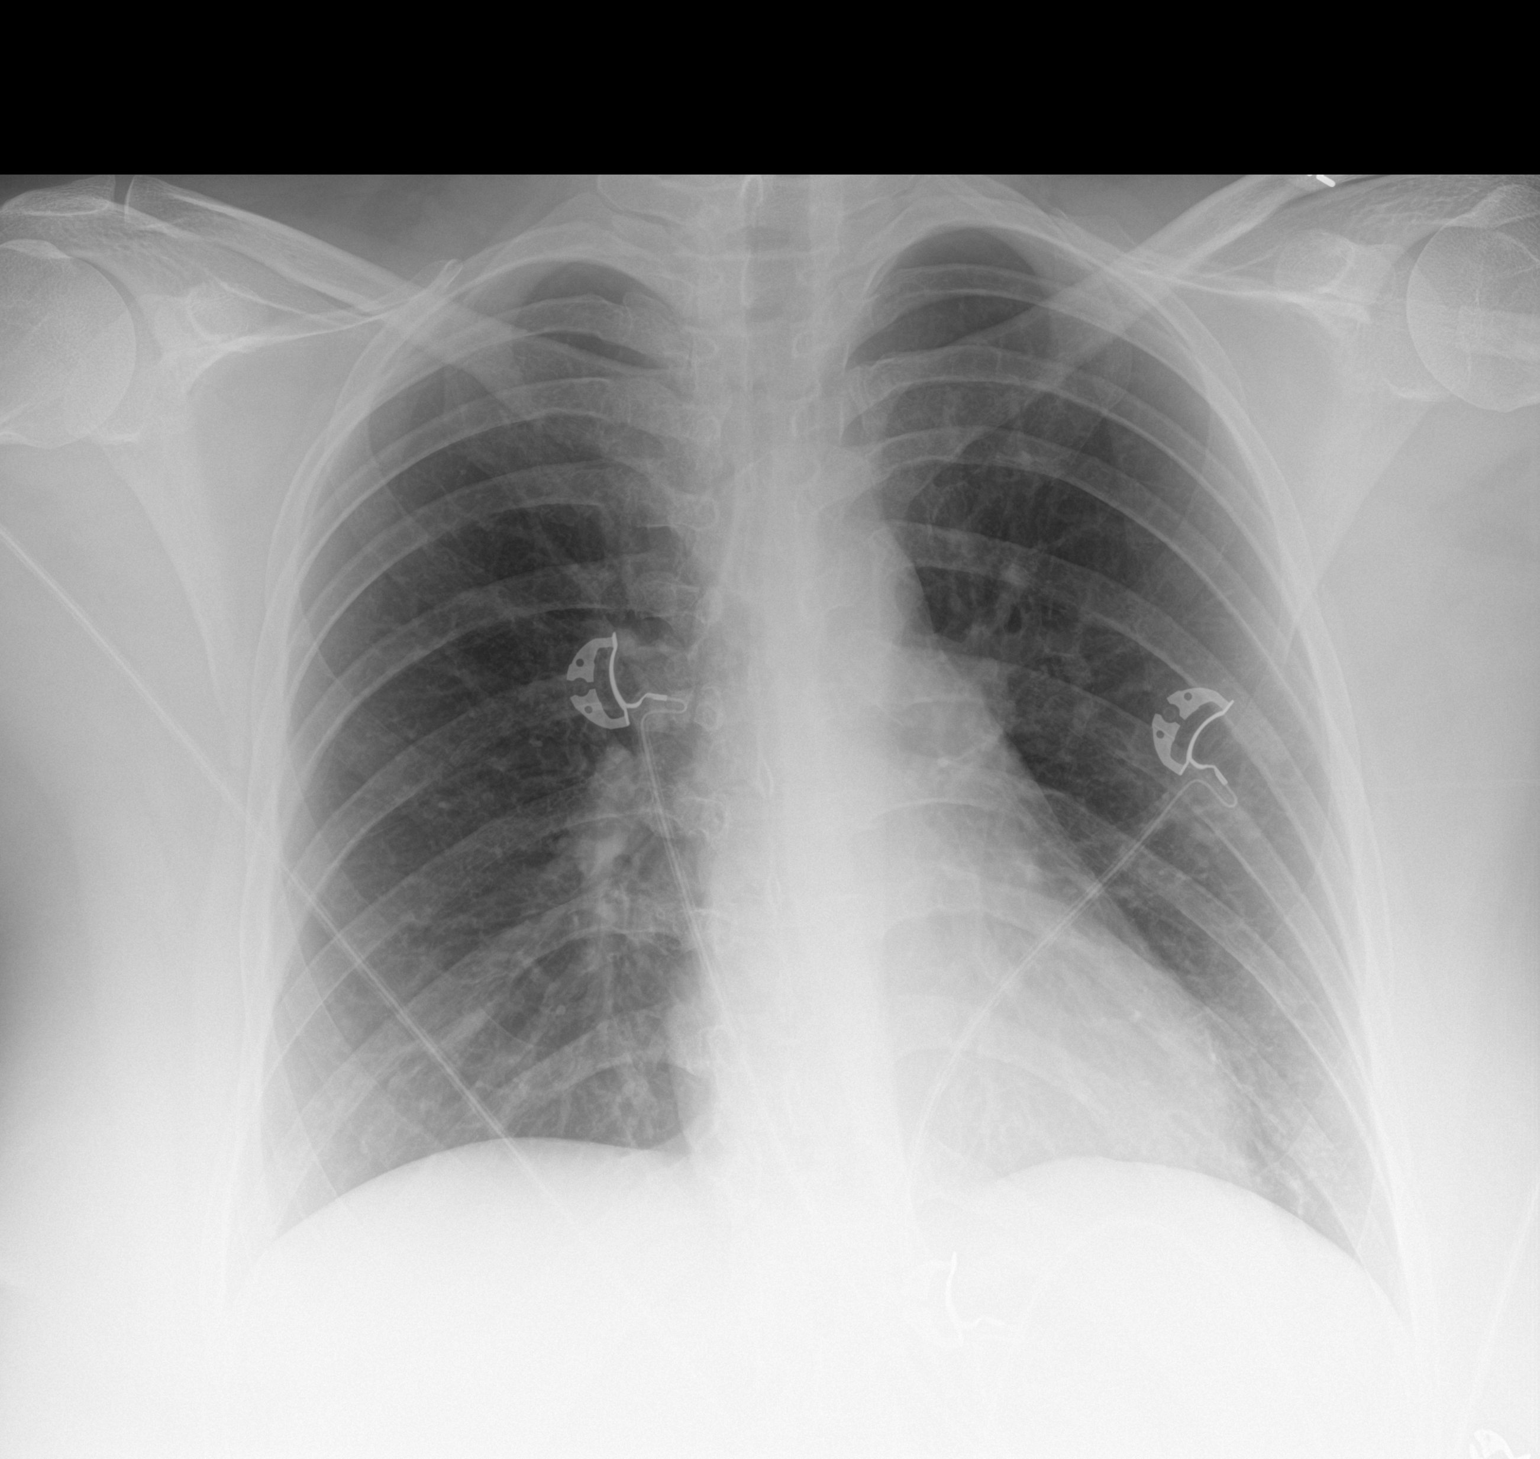

[1 of 1 positions shown; findings below may reference images not displayed]

FINDINGS: The cardiomediastinal silhouette is within normal limits. The lungs
are well inflated and clear. There is no evidence of pleural
effusion or pneumothorax. No acute osseous abnormality is
identified.
IMPRESSION: No active disease.

## 2018-01-23 ENCOUNTER — Encounter: Payer: 59 | Admitting: Obstetrics and Gynecology

## 2018-02-04 ENCOUNTER — Other Ambulatory Visit: Payer: Self-pay

## 2019-10-02 ENCOUNTER — Ambulatory Visit (INDEPENDENT_AMBULATORY_CARE_PROVIDER_SITE_OTHER): Payer: Medicaid Other | Admitting: Obstetrics and Gynecology

## 2019-10-02 ENCOUNTER — Encounter: Payer: Self-pay | Admitting: Obstetrics and Gynecology

## 2019-10-02 ENCOUNTER — Other Ambulatory Visit: Payer: Self-pay

## 2019-10-02 ENCOUNTER — Other Ambulatory Visit: Payer: Medicaid Other

## 2019-10-02 VITALS — BP 127/84 | HR 80 | Ht 65.0 in | Wt 241.7 lb

## 2019-10-02 DIAGNOSIS — Z3041 Encounter for surveillance of contraceptive pills: Secondary | ICD-10-CM | POA: Diagnosis not present

## 2019-10-02 DIAGNOSIS — N926 Irregular menstruation, unspecified: Secondary | ICD-10-CM

## 2019-10-02 LAB — POCT URINE PREGNANCY: Preg Test, Ur: NEGATIVE

## 2019-10-02 MED ORDER — NORETHINDRONE ACET-ETHINYL EST 1.5-30 MG-MCG PO TABS: 1.0000 | ORAL_TABLET | Freq: Every day | ORAL | 3 refills | Status: DC

## 2019-10-02 NOTE — Progress Notes (Signed)
   GYNECOLOGY CLINIC PROGRESS NOTE Subjective:     Jennifer Best is an 28 y.o. [phone] woman who presents for irregular menses for about 2 months. She has had ~ 6 episodes (lasting between 1-5 days) in the past 60 days. She changes her pad or tampon every 1-2 hours. Small clots present. Last day she had bleeding was 2 days ago. She does get cramping the day before or 1st day of bleeding.   She has been on OCPs for 1.5 yrs. Switched to viorele 6 months ago. [facility]ses [facility]. She does report missing 1-2 days of pills a month the past few months. She takes 2 the next day as directed. She has not taken a pregnancy test at home. She does get cramping the day before or 1st day of bleeding. She had a pap in 2018 which was normal.   Menstrual History: OB History    Gravida  4   Para  3   Term  3   Preterm      AB  1   Living  2     SAB      TAB      Ectopic  1   Multiple  0   Live Births  2           Patient's last menstrual period was 09/30/2019.     Review of Systems Pertinent items are noted in HPI.    Objective:    BP 127/84   Pulse 80   Ht 5\' 5"  (1.651 m)   Wt 109.6 kg   LMP 09/30/2019   BMI 40.22 kg/m   BP 127/84   Pulse 80   Ht 5\' 5"  (1.651 m)   Wt 241 lb 11.2 oz (109.6 kg)   LMP 09/30/2019   BMI 40.22 kg/m  General appearance: alert and no distress Abdomen: soft, non-tender; bowel sounds normal; no masses,  no organomegaly Pelvic: external genitalia normal, rectovaginal septum normal.  Vagina with scant thin brown mucoid discharge.  Cervix normal appearing, no lesions and no motion tenderness.  Uterus mobile, nontender, normal shape and size.  Adnexae non-palpable, nontender bilaterally.  Extremities: extremities normal, atraumatic, no cyanosis or edema Neurologic: Grossly normal   Labs:  Results for orders placed or performed in visit on 10/02/19  POCT urine pregnancy  Result Value Ref Range   Preg Test, Ur Negative Negative     Assessment:    Dysfunctional uterine bleeding   OCPs for contraception  Plan:   1. UPT obtained today which is negative 2. Abnormal bleeding possibly due to change in OCPs (by pharmacist, switching brands), however could be due to patient's tolerance of current OCP dose as she has been on this for over 1 year.  Discussion had on option of changing OCP back to original one prescribed or changing to new higher dose prescription. Will change to Junel 30 mcg.Also encouraged better compliance with OCPs.  3. Will also check TSH and prolactin as other endocrine cause of abnormal periods.  4. To f/u if symptoms persist or worsen.    Rubie Maid, MD [facility]

## 2019-10-02 NOTE — Progress Notes (Signed)
Pt present due to having irregular cycles. Pt stated that she has been having cycles that are lasting 1-2 days off and on during the month. Pt stated having 6 cycles in 1 month and half. UPT neg.

## 2019-10-03 ENCOUNTER — Encounter: Payer: Self-pay | Admitting: Obstetrics and Gynecology

## 2019-10-03 LAB — PROLACTIN: Prolactin: 4.5 ng/mL — ABNORMAL LOW (ref 4.8–23.3)

## 2019-10-03 LAB — TSH: TSH: 0.628 u[IU]/mL (ref 0.450–4.500)

## 2019-10-03 NOTE — Patient Instructions (Signed)
Metrorrhagia Metrorrhagia is bleeding from the womb (uterus) that is not normal. The bleeding usually happens between periods. It happens often. Follow these instructions at home: Watch your symptoms for any changes. Tell your doctor about them. Follow these instructions to help with your condition: Eating and drinking   Eat meals that have a lot of the nutrients that your body needs (are well-balanced).  Eat foods that are high in iron. Some foods with iron are: ? Liver. ? Meat. ? Shellfish. ? Green leafy vegetables. ? Eggs.  If you have trouble pooping (constipation): ? Drink plenty of water. Drink enough to keep your pee (urine) pale yellow. ? Take over-the-counter or prescription medicines. ? Eat foods that are high in fiber. These include beans, whole grains, and fresh fruits and vegetables. ? Limit foods that are high in fat and sugar. These include fried or sweet foods. Medicines  Take over-the-counter and prescription medicines only as told by your doctor.  Do not change medicines without talking with your doctor.  Do not take aspirin or medicines that have aspirin: ? During your period. ? During the week before your period.  If you were prescribed iron pills, take them as told by your doctor. Activity  If you need to change your pad or tampon more than one time in 2 hours: ? Lie in bed with your feet raised (elevated). ? Put a cold pack on your lower belly (abdomen). ? Rest as much as you can until the bleeding stops or slows down. General instructions   For 2 months, write down: ? When your period starts. ? When your period ends. ? When you have bleeding that happens outside your period. ? What problems you notice.  Keep all follow-up visits as told by your doctor. This is important. Contact a doctor if:  You feel light-headed.  You feel weak.  You feel sick to your stomach (nauseous).  You throw up (vomit).  You cannot eat or drink without throwing  up.  You feel dizzy while using medicine.  You have watery poop (diarrhea) while using medicine.  You have questions about birth control. Get help right away if:  You have a fever.  You have chills.  You need to change your pad or tampon more than one time in an hour.  You have more bleeding than before.  Your blood has clumps (clots) in it.  You have pain in your belly.  You pass out (lose consciousness).  You have a rash. Summary  Metrorrhagia is bleeding from the womb (uterus) that happens between periods. It happens often.  Watch your symptoms for any changes. Tell your doctor about them.  Eat meals that have a lot of nutrients. Make sure you eat foods that are high in iron. These include liver, meat, shellfish, green vegetables, and eggs.  Get help right away if you have a fever or a rash, you see clots in your blood, or you have more bleeding than before. This information is not intended to replace advice given to you by your health care provider. Make sure you discuss any questions you have with your health care provider. Document Released: 02/05/2012 Document Revised: 05/16/2018 Document Reviewed: 05/16/2018 Elsevier Patient Education  2020 [employer]  

## 2019-10-08 ENCOUNTER — Telehealth: Payer: Self-pay | Admitting: Obstetrics and Gynecology

## 2019-10-08 NOTE — Telephone Encounter (Signed)
The patient called and stated that she needs to speak with her nurse to check the status of her results. Pt requesting a call back. Please advise.

## 2019-10-09 NOTE — Telephone Encounter (Signed)
Spoke with pt. Pt is aware that [facility] needs to review her test results and as soon as they are reviewed I will be contacting her with results. Pt stated that she understood.

## 2019-10-27 NOTE — Telephone Encounter (Signed)
Please see my chart messages

## 2020-03-01 ENCOUNTER — Emergency Department
Admission: EM | Admit: 2020-03-01 | Discharge: 2020-03-01 | Disposition: A | Discharge status: Home / Self Care | Payer: Medicaid Other | Attending: Emergency Medicine | Admitting: Emergency Medicine

## 2020-03-01 ENCOUNTER — Other Ambulatory Visit: Payer: Self-pay

## 2020-03-01 DIAGNOSIS — Y929 Unspecified place or not applicable: Secondary | ICD-10-CM | POA: Diagnosis not present

## 2020-03-01 DIAGNOSIS — Y93G1 Activity, food preparation and clean up: Secondary | ICD-10-CM | POA: Diagnosis not present

## 2020-03-01 DIAGNOSIS — W260XXA Contact with knife, initial encounter: Secondary | ICD-10-CM | POA: Insufficient documentation

## 2020-03-01 DIAGNOSIS — Y999 Unspecified external cause status: Secondary | ICD-10-CM | POA: Diagnosis not present

## 2020-03-01 DIAGNOSIS — S61012A Laceration without foreign body of left thumb without damage to nail, initial encounter: Secondary | ICD-10-CM | POA: Insufficient documentation

## 2020-03-01 NOTE — ED Triage Notes (Signed)
Pt arrveis to ED c/o left thumb laceration, pt states she was cutting avocado and the knife went through the avocado and into her thumb. Bleeding controlled at this time.

## 2020-03-01 NOTE — ED Provider Notes (Signed)
Emergency Department Provider Note  ____________________________________________  Time seen: Approximately 10:46 PM  I have reviewed the triage vital signs and the nursing notes.   HISTORY  Chief Complaint Laceration   Historian Patient     HPI Jennifer Best is a 29 y.o. female presents to the emergency department with a 0.5 cm superficial laceration along the medial aspect of the left thumb at fingertip sustained accidentally with a knife while cutting avocado.  Tetanus status is up-to-date.   Past Medical History:  Diagnosis Date  . Asthma   . Medical history non-contributory   . Obesity (BMI 30-39.9)      Immunizations up to date:  Yes.     Past Medical History:  Diagnosis Date  . Asthma   . Medical history non-contributory   . Obesity (BMI 30-39.9)     Patient Active Problem List   Diagnosis Date Noted  . Bronchitis 08/08/2017  . Encounter to establish care 08/08/2017  . Gestational diabetes mellitus (GDM) in third trimester 06/30/2017  . Obesity (BMI 35.0-39.9 without comorbidity) 12/30/2016  . History of ectopic pregnancy 06/30/2016    Past Surgical History:  Procedure Laterality Date  . CHOLECYSTECTOMY    . LAPAROSCOPY Left 07/17/2016   Procedure: LAPAROSCOPY DIAGNOSTIC;  Surgeon: Hildred Laser, MD;  Location: [facility] ORS;  Service: Gynecology;  Laterality: Left;  removal ectopic pregnancy  . UNILATERAL SALPINGECTOMY Left 07/17/2016   Procedure: UNILATERAL SALPINGECTOMY;  Surgeon: Hildred Laser, MD;  Location: [facility] ORS;  Service: Gynecology;  Laterality: Left;    Prior to Admission medications   Medication Sig Start Date End Date Taking? Authorizing Provider  Norethindrone Acetate-Ethinyl Estradiol (JUNEL 1.5/30) 1.5-30 MG-MCG tablet Take 1 tablet by mouth daily. 10/02/19   Hildred Laser, MD    Allergies Morphine and related and Latex  Family History  Problem Relation Age of Onset  . Lung cancer Mother   . Breast cancer Maternal Grandmother   .  Lung cancer Paternal Grandmother   . Prostate cancer Paternal Grandfather   . Diabetes Paternal Grandfather   . Diabetes Maternal Grandfather   . Ovarian cancer Neg Hx   . Colon cancer Neg Hx     Social History Social History   Tobacco Use  . Smoking status: Former Games developer  . Smokeless tobacco: Former Engineer, water Use Topics  . Alcohol use: Yes    Comment: occass  . Drug use: No     Review of Systems  Constitutional: No fever/chills Eyes:  No discharge ENT: No upper respiratory complaints. Respiratory: no cough. No SOB/ use of accessory muscles to breath Gastrointestinal:   No nausea, no vomiting.  No diarrhea.  No constipation. Musculoskeletal: Negative for musculoskeletal pain. Skin: Patient has laceration.    ____________________________________________   PHYSICAL EXAM:  VITAL SIGNS: ED Triage Vitals  Enc Vitals Group     BP 03/01/20 2021 (!) 137/107     Pulse Rate 03/01/20 2021 92     Resp 03/01/20 2021 18     Temp 03/01/20 2021 97.7 F (36.5 C)     Temp src --      SpO2 03/01/20 2021 94 %     Weight 03/01/20 2019 240 lb (108.9 kg)     Height 03/01/20 2019 5\' 5"  (1.651 m)     Head Circumference --      Peak Flow --      Pain Score 03/01/20 2019 3     Pain Loc --      Pain Edu? --  Excl. in [place]? --      Constitutional: Alert and oriented. Well appearing and in no acute distress. Eyes: Conjunctivae are normal. PERRL. EOMI. Head: Atraumatic. Cardiovascular: Normal rate, regular rhythm. Normal S1 and S2.  Good peripheral circulation. Respiratory: Normal respiratory effort without tachypnea or retractions. Lungs CTAB. Good air entry to the bases with no decreased or absent breath sounds Gastrointestinal: Bowel sounds x 4 quadrants. Soft and nontender to palpation. No guarding or rigidity. No distention. Musculoskeletal: Full range of motion to all extremities. No obvious deformities noted Neurologic:  Normal for age. No gross focal neurologic  deficits are appreciated.  Skin: Patient has superficial laceration along the medial aspect of the left first digit at fingertip conducive to repair with Dermabond. Psychiatric: Mood and affect are normal for age. Speech and behavior are normal.   ____________________________________________   LABS (all labs ordered are listed, but only abnormal results are displayed)  Labs Reviewed - No data to display ____________________________________________  EKG   ____________________________________________  RADIOLOGY  No results found.  ____________________________________________    PROCEDURES  Procedure(s) performed:     Procedures  LACERATION REPAIR Performed by: Lannie Fields Authorized by: Lannie Fields Consent: Verbal consent obtained. Risks and benefits: risks, benefits and alternatives were discussed Consent given by: patient Patient identity confirmed: provided demographic data Prepped and Draped in normal sterile fashion Wound explored  Laceration Location: Left thumb  Laceration Length: 0.5 cm  No Foreign Bodies seen or palpated  Anesthesia: local infiltration  Irrigation method: syringe Amount of cleaning: standard  Skin closure: Dermbond  Patient tolerance: Patient tolerated the procedure well with no immediate complications.    Medications - No data to display   ____________________________________________   INITIAL IMPRESSION / ASSESSMENT AND PLAN / ED COURSE  Pertinent labs & imaging results that were available during my care of the patient were reviewed by me and considered in my medical decision making (see chart for details).      Assessment and plan Laceration 29 year old female presents to the emergency department with a 0.5 cm laceration along left thumb repaired in the emergency department using Dermabond.  Return precautions were given to return with new or worsening symptoms.  All patient questions were  answered.   ____________________________________________  FINAL CLINICAL IMPRESSION(S) / ED DIAGNOSES  Final diagnoses:  Laceration of left thumb without foreign body without damage to nail, initial encounter      NEW MEDICATIONS STARTED DURING THIS VISIT:  ED Discharge Orders    None          This chart was dictated using voice recognition software/Dragon. Despite best efforts to proofread, errors can occur which can change the meaning. Any change was purely unintentional.     Lannie Fields, PA-C 03/01/20 2250    Duffy Bruce, MD 03/03/20 2041

## 2020-03-01 NOTE — ED Notes (Signed)
Pt is ambulatory to the room, in no acute distress, thumb wrapped in gauze and pt applying pressure.  Pt states she cut her left thumb cutting an avocado, and it would not totally stop bleeding.

## 2020-05-24 ENCOUNTER — Telehealth: Payer: Self-pay | Admitting: Obstetrics and Gynecology

## 2020-05-24 NOTE — Telephone Encounter (Signed)
Patient needs her birth control prescription, Junel sent to a [facility] in [place] where she is vacationing.  Address [place]; [address] [place], [place] [address]

## 2020-05-27 MED ORDER — NORETHINDRONE ACET-ETHINYL EST 1.5-30 MG-MCG PO TABS: 1.0000 | ORAL_TABLET | Freq: Every day | ORAL | 0 refills | Status: DC

## 2020-05-27 NOTE — Addendum Note (Signed)
Addended by: Silvano Bilis on: 05/27/2020 12:37 PM   Modules accepted: Orders

## 2020-05-27 NOTE — Telephone Encounter (Signed)
Pt called and is aware that her medication has been refilled and sent to the [facility] in [place].

## 2020-08-27 ENCOUNTER — Other Ambulatory Visit: Payer: Self-pay | Admitting: Obstetrics and Gynecology

## 2020-08-27 NOTE — Telephone Encounter (Signed)
Hello Ladies, Will you please contact pt and schedule her for an annual exam and medication refill?  Dr. Valentino Saxon gave her a 3 month supply.  Thanks [employer]

## 2020-08-27 NOTE — Telephone Encounter (Signed)
Patient needs annual exam before next refill given.

## 2020-09-18 ENCOUNTER — Emergency Department: Payer: Medicaid Other

## 2020-09-18 ENCOUNTER — Encounter: Payer: Self-pay | Admitting: Emergency Medicine

## 2020-09-18 ENCOUNTER — Emergency Department
Admission: EM | Admit: 2020-09-18 | Discharge: 2020-09-18 | Disposition: A | Discharge status: Home / Self Care | Payer: Medicaid Other | Attending: Emergency Medicine | Admitting: Emergency Medicine

## 2020-09-18 ENCOUNTER — Other Ambulatory Visit: Payer: Self-pay

## 2020-09-18 DIAGNOSIS — J45909 Unspecified asthma, uncomplicated: Secondary | ICD-10-CM | POA: Diagnosis not present

## 2020-09-18 DIAGNOSIS — Z87891 Personal history of nicotine dependence: Secondary | ICD-10-CM | POA: Insufficient documentation

## 2020-09-18 DIAGNOSIS — S93402A Sprain of unspecified ligament of left ankle, initial encounter: Secondary | ICD-10-CM

## 2020-09-18 DIAGNOSIS — X501XXA Overexertion from prolonged static or awkward postures, initial encounter: Secondary | ICD-10-CM | POA: Diagnosis not present

## 2020-09-18 DIAGNOSIS — Z91040 Latex allergy status: Secondary | ICD-10-CM | POA: Insufficient documentation

## 2020-09-18 DIAGNOSIS — S99912A Unspecified injury of left ankle, initial encounter: Secondary | ICD-10-CM | POA: Diagnosis present

## 2020-09-18 MED ORDER — TRAMADOL HCL 50 MG PO TABS: 50.0000 mg | ORAL_TABLET | Freq: Four times a day (QID) | ORAL | 0 refills | Status: DC | PRN

## 2020-09-18 MED ORDER — IBUPROFEN 600 MG PO TABS: 600.0000 mg | ORAL_TABLET | Freq: Three times a day (TID) | ORAL | 0 refills | Status: DC | PRN

## 2020-09-18 NOTE — ED Triage Notes (Signed)
Pt reports about an hour ago her left ankle gave out on her and now she can't walk on it

## 2020-09-18 NOTE — ED Provider Notes (Signed)
[facility] Emergency Department Provider Note   ____________________________________________   First MD Initiated Contact with Patient 09/18/20 1239     (approximate)  I have reviewed the triage vital signs and the nursing notes.   HISTORY  Chief Complaint Ankle Pain    HPI Jennifer Best is a 29 y.o. female patient complain left ankle pain secondary to a twisting incident prior to arrival.  Patient now unable to bear weight without discomfort.  Patient denies loss of sensation or movement.  Rates pain as 8/10.  Describes pain as "achy".  No palliative measure prior to arrival.         Past Medical History:  Diagnosis Date   Asthma    Medical history non-contributory    Obesity (BMI 30-39.9)     Patient Active Problem List   Diagnosis Date Noted   Bronchitis 08/08/2017   Encounter to establish care 08/08/2017   Gestational diabetes mellitus (GDM) in third trimester 06/30/2017   Obesity (BMI 35.0-39.9 without comorbidity) 12/30/2016   History of ectopic pregnancy 06/30/2016    Past Surgical History:  Procedure Laterality Date   CHOLECYSTECTOMY     LAPAROSCOPY Left 07/17/2016   Procedure: LAPAROSCOPY DIAGNOSTIC;  Surgeon: Hildred Laser, MD;  Location: [facility] ORS;  Service: Gynecology;  Laterality: Left;  removal ectopic pregnancy   UNILATERAL SALPINGECTOMY Left 07/17/2016   Procedure: UNILATERAL SALPINGECTOMY;  Surgeon: Hildred Laser, MD;  Location: [facility] ORS;  Service: Gynecology;  Laterality: Left;    Prior to Admission medications   Medication Sig Start Date End Date Taking? Authorizing Provider  ibuprofen (ADVIL) 600 MG tablet Take 1 tablet (600 mg total) by mouth every 8 (eight) hours as needed. 09/18/20   Joni Reining, PA-C  JUNEL 1.5/30 1.5-30 MG-MCG tablet TAKE 1 TABLET BY MOUTH EVERY DAY 08/27/20   Hildred Laser, MD  traMADol (ULTRAM) 50 MG tablet Take 1 tablet (50 mg total) by mouth every 6 (six) hours as needed for moderate  pain. 09/18/20   Joni Reining, PA-C    Allergies Morphine and related and Latex  Family History  Problem Relation Age of Onset   Lung cancer Mother    Breast cancer Maternal Grandmother    Lung cancer Paternal Grandmother    Prostate cancer Paternal Grandfather    Diabetes Paternal Grandfather    Diabetes Maternal Grandfather    Ovarian cancer Neg Hx    Colon cancer Neg Hx     Social History Social History   Tobacco Use   Smoking status: Former Smoker   Smokeless tobacco: Former Forensic psychologist Use: Never used  Substance Use Topics   Alcohol use: Yes    Comment: occass   Drug use: No    Review of Systems Constitutional: No fever/chills Eyes: No visual changes. ENT: No sore throat. Cardiovascular: Denies chest pain. Respiratory: Denies shortness of breath. Gastrointestinal: No abdominal pain.  No nausea, no vomiting.  No diarrhea.  No constipation. Genitourinary: Negative for dysuria. Musculoskeletal: Negative for back pain. Skin: Negative for rash. Neurological: Negative for headaches, focal weakness or numbness. Allergic/Immunilogical: Morphine and latex.  ____________________________________________   PHYSICAL EXAM:  VITAL SIGNS: ED Triage Vitals  Enc Vitals Group     BP 09/18/20 1139 125/73     Pulse Rate 09/18/20 1139 84     Resp 09/18/20 1139 18     Temp 09/18/20 1139 98.2 F (36.8 C)     Temp Source 09/18/20 1139 Oral  SpO2 09/18/20 1139 95 %     Weight 09/18/20 1135 240 lb (108.9 kg)     Height 09/18/20 1135 5\' 5"  (1.651 m)     Head Circumference --      Peak Flow --      Pain Score 09/18/20 1135 8     Pain Loc --      Pain Edu? --      Excl. in [place]? --    Constitutional: Alert and oriented. Well appearing and in no acute distress. Cardiovascular: Normal rate, regular rhythm. Grossly normal heart sounds.  Good peripheral circulation. Respiratory: Normal respiratory effort.  No retractions. Lungs  CTAB. Musculoskeletal: No lower extremity tenderness nor edema.  No joint effusions. Neurologic:  Normal speech and language. No gross focal neurologic deficits are appreciated. No gait instability. Skin:  Skin is warm, dry and intact. No rash noted. Psychiatric: Mood and affect are normal. Speech and behavior are normal.  ____________________________________________   LABS (all labs ordered are listed, but only abnormal results are displayed)  Labs Reviewed - No data to display ____________________________________________  EKG   ____________________________________________  RADIOLOGY I, 09/20/20, personally viewed and evaluated these images (plain radiographs) as part of my medical decision making, as well as reviewing the written report by the radiologist.  ED MD interpretation: No acute findings on x-ray of left ankle.  Official radiology report(s): DG Ankle Complete Left  Result Date: 09/18/2020 CLINICAL DATA:  Left ankle pain and swelling EXAM: LEFT ANKLE COMPLETE - 3+ VIEW COMPARISON:  None. FINDINGS: There is no evidence of fracture, dislocation, or joint effusion. There is no evidence of arthropathy or other focal bone abnormality. Mild diffuse soft tissue swelling. IMPRESSION: Mild diffuse soft tissue swelling. No acute fracture or dislocation. Electronically Signed   By: 09/20/2020 D.O.   On: 09/18/2020 12:32    ____________________________________________   PROCEDURES  Procedure(s) performed (including Critical Care):  Procedures   ____________________________________________   INITIAL IMPRESSION / ASSESSMENT AND PLAN / ED COURSE  As part of my medical decision making, I reviewed the following data within the electronic MEDICAL RECORD NUMBER         Patient presents with with complaint of left ankle pain with weightbearing.  Complain of.  Secondary to a twisting incident prior to arrival.  Discussed no acute findings on x-ray of the left ankle.   Patient complaint physical exam consistent with ankle sprain.  Patient given discharge care instruction placed in a knee splint.  Patient given crutches to assist with ambulation.  Take medication as directed and follow-up with PCP as needed.     ____________________________________________   FINAL CLINICAL IMPRESSION(S) / ED DIAGNOSES  Final diagnoses:  Sprain of left ankle, unspecified ligament, initial encounter     ED Discharge Orders         Ordered    ibuprofen (ADVIL) 600 MG tablet  Every 8 hours PRN        09/18/20 1307    traMADol (ULTRAM) 50 MG tablet  Every 6 hours PRN        09/18/20 1307          *Please note:  Jennifer Best was evaluated in Emergency Department on 09/18/2020 for the symptoms described in the history of present illness. She was evaluated in the context of the global COVID-19 pandemic, which necessitated consideration that the patient might be at risk for infection with the SARS-CoV-2 virus that causes COVID-19. Institutional protocols and algorithms that pertain to  the evaluation of patients at risk for COVID-19 are in a state of rapid change based on information released by regulatory bodies including the CDC and federal and state organizations. These policies and algorithms were followed during the patient's care in the ED.  Some ED evaluations and interventions may be delayed as a result of limited staffing during and the pandemic.*   Note:  This document was prepared using Dragon voice recognition software and may include unintentional dictation errors.    Joni Reining, PA-C 09/18/20 1345    Delton [place], MD 09/18/20 [phone]

## 2020-09-18 NOTE — Discharge Instructions (Signed)
Follow discharge care instruction take medication as directed. °

## 2020-11-27 NOTE — L&D Delivery Note (Signed)
Delivery Summary for Jennifer Best  Labor Events:   Preterm labor: No data found  Rupture date: 07/26/2021  Rupture time: 12:15 PM  Rupture type: Spontaneous  Fluid Color: Clear Light Meconium  Induction: No data found  Augmentation: No data found  Complications: No data found  Cervical ripening: No data found No data found   No data found     Delivery:   Episiotomy: No data found  Lacerations: No data found  Repair suture: No data found  Repair # of packets: No data found  Blood loss (ml): 150   Information for the patient's newborn:  Jennifer, Best [[id number]]   Delivery 07/27/2021 8:41 AM by  Vaginal, Spontaneous Sex:  female Gestational Age: [redacted]w[redacted]d Delivery Clinician:   Living?:         APGARS  One minute Five minutes Ten minutes  Skin color:        Heart rate:        Grimace:        Muscle tone:        Breathing:        Totals: 8  8      Presentation/position:      Resuscitation:   Cord information:    Disposition of cord blood:     Blood gases sent?  Complications:   Placenta: Delivered:       appearance Newborn Measurements: Weight: 8 lb 5.3 oz (3780 g)  Height: 20.08"  Head circumference:    Chest circumference:    Other providers:    Additional  information: Forceps:   Vacuum:   Breech:   Observed anomalies      Delivery Note At 8:41 AM a viable and healthy female was delivered via Vaginal, Spontaneous (Presentation: vertex, ROA position).  APGAR: 8, 8; weight 8 lb 5.3 oz (3780 g).   Placenta status: Spontaneous, Intact.  Cord: 3 vessels with the following complications: None.  Cord pH: not obtained. Cord blood collected.   Anesthesia: None Episiotomy: None Lacerations: None Suture Repair:  N/A Est. Blood Loss (mL): 150  Mom to postpartum.  Baby to Couplet care / Skin to Skin.  Hildred Laser, MD 07/27/2021, 8:59 AM

## 2020-11-29 NOTE — Patient Instructions (Addendum)
Preventive Care 21-31 Years Old, Female Preventive care refers to visits with your health care provider and lifestyle choices that can promote health and wellness. This includes:  A yearly physical exam. This may also be called an annual well check.  Regular dental visits and eye exams.  Immunizations.  Screening for certain conditions.  Healthy lifestyle choices, such as eating a healthy diet, getting regular exercise, not using drugs or products that contain nicotine and tobacco, and limiting alcohol use. What can I expect for my preventive care visit? Physical exam Your health care provider will check your:  Height and weight. This may be used to calculate body mass index (BMI), which tells if you are at a healthy weight.  Heart rate and blood pressure.  Skin for abnormal spots. Counseling Your health care provider may ask you questions about your:  Alcohol, tobacco, and drug use.  Emotional well-being.  Home and relationship well-being.  Sexual activity.  Eating habits.  Work and work environment.  Method of birth control.  Menstrual cycle.  Pregnancy history. What immunizations do I need?  Influenza (flu) vaccine  This is recommended every year. Tetanus, diphtheria, and pertussis (Tdap) vaccine  You may need a Td booster every 10 years. Varicella (chickenpox) vaccine  You may need this if you have not been vaccinated. Human papillomavirus (HPV) vaccine  If recommended by your health care provider, you may need three doses over 6 months. Measles, mumps, and rubella (MMR) vaccine  You may need at least one dose of MMR. You may also need a second dose. Meningococcal conjugate (MenACWY) vaccine  One dose is recommended if you are age 19-21 years and a first-year college student living in a residence hall, or if you have one of several medical conditions. You may also need additional booster doses. Pneumococcal conjugate (PCV13) vaccine  You may need  this if you have certain conditions and were not previously vaccinated. Pneumococcal polysaccharide (PPSV23) vaccine  You may need one or two doses if you smoke cigarettes or if you have certain conditions. Hepatitis A vaccine  You may need this if you have certain conditions or if you travel or work in places where you may be exposed to hepatitis A. Hepatitis B vaccine  You may need this if you have certain conditions or if you travel or work in places where you may be exposed to hepatitis B. Haemophilus influenzae type b (Hib) vaccine  You may need this if you have certain conditions. You may receive vaccines as individual doses or as more than one vaccine together in one shot (combination vaccines). Talk with your health care provider about the risks and benefits of combination vaccines. What tests do I need?  Blood tests  Lipid and cholesterol levels. These may be checked every 5 years starting at age 20.  Hepatitis C test.  Hepatitis B test. Screening  Diabetes screening. This is done by checking your blood sugar (glucose) after you have not eaten for a while (fasting).  Sexually transmitted disease (STD) testing.  BRCA-related cancer screening. This may be done if you have a family history of breast, ovarian, tubal, or peritoneal cancers.  Pelvic exam and Pap test. This may be done every 3 years starting at age 21. Starting at age 30, this may be done every 5 years if you have a Pap test in combination with an HPV test. Talk with your health care provider about your test results, treatment options, and if necessary, the need for more tests.   Follow these instructions at home: Eating and drinking   Eat a diet that includes fresh fruits and vegetables, whole grains, lean protein, and low-fat dairy.  Take vitamin and mineral supplements as recommended by your health care provider.  Do not drink alcohol if: ? Your health care provider tells you not to drink. ? You are  pregnant, may be pregnant, or are planning to become pregnant.  If you drink alcohol: ? Limit how much you have to 0-1 drink a day. ? Be aware of how much alcohol is in your drink. In the [place] one drink equals one 12 oz bottle of beer (355 mL), one 5 oz glass of wine (148 mL), or one 1 oz glass of hard liquor (44 mL). Lifestyle  Take daily care of your teeth and gums.  Stay active. Exercise for at least 30 minutes on 5 or more days each week.  Do not use any products that contain nicotine or tobacco, such as cigarettes, e-cigarettes, and chewing tobacco. If you need help quitting, ask your health care provider.  If you are sexually active, practice safe sex. Use a condom or other form of birth control (contraception) in order to prevent pregnancy and STIs (sexually transmitted infections). If you plan to become pregnant, see your health care provider for a preconception visit. What's next?  Visit your health care provider once a year for a well check visit.  Ask your health care provider how often you should have your eyes and teeth checked.  Stay up to date on all vaccines. This information is not intended to replace advice given to you by your health care provider. Make sure you discuss any questions you have with your health care provider. Document Revised: 07/25/2018 Document Reviewed: 07/25/2018 Elsevier Patient Education  2020 [employer] Breast Self-Awareness Breast self-awareness is knowing how your breasts look and feel. Doing breast self-awareness is important. It allows you to catch a breast problem early while it is still small and can be treated. All women should do breast self-awareness, including women who have had breast implants. Tell your doctor if you notice a change in your breasts. What you need:  A mirror.  A well-lit room. How to do a breast self-exam A breast self-exam is one way to learn what is normal for your breasts and to check for changes. To do a  breast self-exam: Look for changes  1. Take off all the clothes above your waist. 2. Stand in front of a mirror in a room with good lighting. 3. Put your hands on your hips. 4. Push your hands down. 5. Look at your breasts and nipples in the mirror to see if one breast or nipple looks different from the other. Check to see if: ? The shape of one breast is different. ? The size of one breast is different. ? There are wrinkles, dips, and bumps in one breast and not the other. 6. Look at each breast for changes in the skin, such as: ? Redness. ? Scaly areas. 7. Look for changes in your nipples, such as: ? Liquid around the nipples. ? Bleeding. ? Dimpling. ? Redness. ? A change in where the nipples are. Feel for changes  1. Lie on your back on the floor. 2. Feel each breast. To do this, follow these steps: ? Pick a breast to feel. ? Put the arm closest to that breast above your head. ? Use your other arm to feel the nipple area of your breast. Feel   the area with the pads of your three middle fingers by making small circles with your fingers. For the first circle, press lightly. For the second circle, press harder. For the third circle, press even harder. ? Keep making circles with your fingers at the different pressures as you move down your breast. Stop when you feel your ribs. ? Move your fingers a little toward the center of your body. ? Start making circles with your fingers again, this time going up until you reach your collarbone. ? Keep making up-and-down circles until you reach your armpit. Remember to keep using the three pressures. ? Feel the other breast in the same way. 3. Sit or stand in the tub or shower. 4. With soapy water on your skin, feel each breast the same way you did in step 2 when you were lying on the floor. Write down what you find Writing down what you find can help you remember what to tell your doctor. Write down:  What is normal for each breast.  Any  changes you find in each breast, including: ? The kind of changes you find. ? Whether you have pain. ? Size and location of any lumps.  When you last had your menstrual period. General tips  Check your breasts every month.  If you are breastfeeding, the best time to check your breasts is after you feed your baby or after you use a breast pump.  If you get menstrual periods, the best time to check your breasts is 5-7 days after your menstrual period is over.  With time, you will become comfortable with the self-exam, and you will begin to know if there are changes in your breasts. Contact a doctor if you:  See a change in the shape or size of your breasts or nipples.  See a change in the skin of your breast or nipples, such as red or scaly skin.  Have fluid coming from your nipples that is not normal.  Find a lump or thick area that was not there before.  Have pain in your breasts.  Have any concerns about your breast health. Summary  Breast self-awareness includes looking for changes in your breasts, as well as feeling for changes within your breasts.  Breast self-awareness should be done in front of a mirror in a well-lit room.  You should check your breasts every month. If you get menstrual periods, the best time to check your breasts is 5-7 days after your menstrual period is over.  Let your doctor know of any changes you see in your breasts, including changes in size, changes on the skin, pain or tenderness, or fluid from your nipples that is not normal. This information is not intended to replace advice given to you by your health care provider. Make sure you discuss any questions you have with your health care provider. Document Revised: 07/02/2018 Document Reviewed: 07/02/2018 Elsevier Patient Education  2020 [employer].   Preparing for Pregnancy If you are considering becoming pregnant, make an appointment to see your regular health care provider to learn how to  prepare for a safe and healthy pregnancy (preconception care). During a preconception care visit, your health care provider will:  Do a complete physical exam, including a Pap test.  Take a complete medical history.  Give you information, answer your questions, and help you resolve problems. Preconception checklist Medical history  Tell your health care provider about any current or past medical conditions. Your pregnancy or your ability to  become pregnant may be affected by chronic conditions, such as diabetes, chronic hypertension, and thyroid problems.  Include your family's medical history as well as your partner's medical history.  Tell your health care provider about any history of STIs (sexually transmitted infections).These can affect your pregnancy. In some cases, they can be passed to your baby. Discuss any concerns that you have about STIs.  If indicated, discuss the benefits of genetic testing. This testing will show whether there are any genetic conditions that may be passed from you or your partner to your baby.  Tell your health care provider about: ? Any problems you have had with conception or pregnancy. ? Any medicines you take. These include vitamins, herbal supplements, and over-the-counter medicines. ? Your history of immunizations. Discuss any vaccinations that you may need. Diet  Ask your health care provider what to include in a healthy diet that has a balance of nutrients. This is especially important when you are pregnant or preparing to become pregnant.  Ask your health care provider to help you reach a healthy weight before pregnancy. ? If you are overweight, you may be at higher risk for certain complications, such as high blood pressure, diabetes, and preterm birth. ? If you are underweight, you are more likely to have a baby who has a low birth weight. Lifestyle, work, and home  Let your health care provider know: ? About any lifestyle habits that you  have, such as alcohol use, drug use, or smoking. ? About recreational activities that may put you at risk during pregnancy, such as downhill skiing and certain exercise programs. ? Tell your health care provider about any international travel, especially any travel to places with an active [place] virus outbreak. ? About harmful substances that you may be exposed to at work or at home. These include chemicals, pesticides, radiation, or even litter boxes. ? If you do not feel safe at home. Mental health  Tell your health care provider about: ? Any history of mental health conditions, including feelings of depression, sadness, or anxiety. ? Any medicines that you take for a mental health condition. These include herbs and supplements. Home instructions to prepare for pregnancy Lifestyle   Eat a balanced diet. This includes fresh fruits and vegetables, whole grains, lean meats, low-fat dairy products, healthy fats, and foods that are high in fiber. Ask to meet with a nutritionist or registered dietitian for assistance with meal planning and goals.  Get regular exercise. Try to be active for at least 30 minutes a day on most days of the week. Ask your health care provider which activities are safe during pregnancy.  Do not use any products that contain nicotine or tobacco, such as cigarettes and e-cigarettes. If you need help quitting, ask your health care provider.  Do not drink alcohol.  Do not take illegal drugs.  Maintain a healthy weight. Ask your health care provider what weight range is right for you. General instructions  Keep an accurate record of your menstrual periods. This makes it easier for your health care provider to determine your baby's due date.  Begin taking prenatal vitamins and folic acid supplements daily as directed by your health care provider.  Manage any chronic conditions, such as high blood pressure and diabetes, as told by your health care provider. This is  important. How do I know that I am pregnant? You may be pregnant if you have been sexually active and you miss your period. Symptoms of early pregnancy include:  Mild cramping.  Very light vaginal bleeding (spotting).  Feeling unusually tired.  Nausea and vomiting (morning sickness). If you have any of these symptoms and you suspect that you might be pregnant, you can take a home pregnancy test. These tests check for a hormone in your urine (human chorionic gonadotropin, or hCG). A woman's body begins to make this hormone during early pregnancy. These tests are very accurate. Wait until at least the first day after you miss your period to take one. If the test shows that you are pregnant (you get a positive result), call your health care provider to make an appointment for prenatal care. What should I do if I become pregnant?      Make an appointment with your health care provider as soon as you suspect you are pregnant.  Do not use any products that contain nicotine, such as cigarettes, chewing tobacco, and e-cigarettes. If you need help quitting, ask your health care provider.  Do not drink alcoholic beverages. Alcohol is related to a number of birth defects.  Avoid toxic odors and chemicals.  You may continue to have sexual intercourse if it does not cause pain or other problems, such as vaginal bleeding. This information is not intended to replace advice given to you by your health care provider. Make sure you discuss any questions you have with your health care provider. Document Revised: 11/15/2017 Document Reviewed: 06/04/2016 Elsevier Patient Education  [place].

## 2020-11-30 ENCOUNTER — Other Ambulatory Visit (HOSPITAL_COMMUNITY)
Admission: RE | Admit: 2020-11-30 | Discharge: 2020-11-30 | Disposition: A | Discharge status: Home / Self Care | Payer: Medicaid Other | Source: Ambulatory Visit | Attending: Obstetrics and Gynecology | Admitting: Obstetrics and Gynecology

## 2020-11-30 ENCOUNTER — Other Ambulatory Visit: Payer: Self-pay

## 2020-11-30 ENCOUNTER — Encounter: Payer: Self-pay | Admitting: Obstetrics and Gynecology

## 2020-11-30 ENCOUNTER — Ambulatory Visit (INDEPENDENT_AMBULATORY_CARE_PROVIDER_SITE_OTHER): Payer: 59 | Admitting: Obstetrics and Gynecology

## 2020-11-30 VITALS — BP 118/74 | HR 75 | Ht 65.0 in | Wt 235.1 lb

## 2020-11-30 DIAGNOSIS — Z124 Encounter for screening for malignant neoplasm of cervix: Secondary | ICD-10-CM | POA: Insufficient documentation

## 2020-11-30 DIAGNOSIS — Z8632 Personal history of gestational diabetes: Secondary | ICD-10-CM

## 2020-11-30 DIAGNOSIS — Z331 Pregnant state, incidental: Secondary | ICD-10-CM

## 2020-11-30 DIAGNOSIS — Z01419 Encounter for gynecological examination (general) (routine) without abnormal findings: Secondary | ICD-10-CM | POA: Diagnosis not present

## 2020-11-30 DIAGNOSIS — Z789 Other specified health status: Secondary | ICD-10-CM

## 2020-11-30 DIAGNOSIS — E669 Obesity, unspecified: Secondary | ICD-10-CM | POA: Diagnosis not present

## 2020-11-30 DIAGNOSIS — Z13220 Encounter for screening for lipoid disorders: Secondary | ICD-10-CM

## 2020-11-30 LAB — POCT URINE PREGNANCY: Preg Test, Ur: POSITIVE — AB

## 2020-11-30 NOTE — Progress Notes (Signed)
GYNECOLOGY ANNUAL PHYSICAL EXAM PROGRESS NOTE  Subjective:    Jennifer Best is a 30 y.o. [phone] female who presents for an annual exam. The patient has no complaints today. The patient is sexually active. The patient wears seatbelts: yes. The patient participates in regular exercise: no but plans to start soon, just bought gym membership. Has the patient ever been transfused or tattooed?: yes. The patient reports that there is not domestic violence in her life.   Patient desires to discuss birth control. Currently on combined OCPs, but would like a Nexplanon. Has received information regarding the device in the past.  Ultimately desires for partner to get vasectomy, currently in discussion with her partner.   Menstrual History:  Menarche age: 16 No LMP recorded (lmp unknown).  Sometime last month.  Period Cycle (Days): 28 Period Duration (Days): 3-5 Period Pattern: (!) Irregular Menstrual Flow: Heavy,Light Menstrual Control: Tampon Menstrual Control Change Freq (Hours): 2-3 Dysmenorrhea: None Dysmenorrhea Symptoms: Cramping   Gynecologic History:  Contraception: OCP (estrogen/progesterone) History of STI's: Denies Last Pap: 12/27/2016. Results were: normal.  Denies h/o abnormal pap smears.    Upstream - 11/30/20 1406      Pregnancy Intention Screening   Does the patient want to become pregnant in the next year? No    Does the patient's partner want to become pregnant in the next year? No    Would the patient like to discuss contraceptive options today? Yes      Contraception Wrap Up   Current Method Oral Contraceptive          The pregnancy intention screening data noted above was reviewed. Potential methods of contraception were discussed. The patient elected to proceed with Hormonal Implant.    OB History  Gravida Para Term Preterm AB Living  5 3 3  0 2 2  SAB IAB Ectopic Multiple Live Births  1 0 1 0 2    # Outcome Date GA Lbr Len/2nd Weight Sex Delivery Anes  PTL Lv  5 SAB 2020          4 Term 07/02/17 [redacted]w[redacted]d  6 lb 12 oz (3.062 kg) M Vag-Spont None  LIV     Name: Tessmer,BOY Catori     Apgar1: 8  Apgar5: 9  3 Ectopic 2017          2 Term 2015   7 lb 1.6 oz (3.221 kg) F Vag-Spont   LIV  1 Term 2012   6 lb 2.2 oz (2.785 kg) F Vag-Spont   LIV    Past Medical History:  Diagnosis Date  . Asthma   . Medical history non-contributory   . Obesity (BMI 30-39.9)     Past Surgical History:  Procedure Laterality Date  . CHOLECYSTECTOMY    . LAPAROSCOPY Left 07/17/2016   Procedure: LAPAROSCOPY DIAGNOSTIC;  Surgeon: Rubie Maid, MD;  Location: [facility];  Service: Gynecology;  Laterality: Left;  removal ectopic pregnancy  . UNILATERAL SALPINGECTOMY Left 07/17/2016   Procedure: UNILATERAL SALPINGECTOMY;  Surgeon: Rubie Maid, MD;  Location: [facility];  Service: Gynecology;  Laterality: Left;    Family History  Problem Relation Age of Onset  . Lung cancer Mother   . Breast cancer Maternal Grandmother   . Lung cancer Paternal Grandmother   . Prostate cancer Paternal Grandfather   . Diabetes Paternal Grandfather   . Diabetes Maternal Grandfather   . Ovarian cancer Neg Hx   . Colon cancer Neg Hx     Social History  Socioeconomic History  . Marital status: Single    Spouse name: Not on file  . Number of children: Not on file  . Years of education: Not on file  . Highest education level: Not on file  Occupational History  . Not on file  Tobacco Use  . Smoking status: Former Games developer  . Smokeless tobacco: Former Clinical biochemist  . Vaping Use: Never used  Substance and Sexual Activity  . Alcohol use: Yes    Comment: occass  . Drug use: No  . Sexual activity: Yes    Partners: Male, Female    Birth control/protection: Pill    Comment: husband  Other Topics Concern  . Not on file  Social History Narrative  . Not on file   Social Determinants of Health   Financial Resource Strain: Not on file  Food Insecurity: Not on file   Transportation Needs: Not on file  Physical Activity: Not on file  Stress: Not on file  Social Connections: Not on file  Intimate Partner Violence: Not on file    Current Outpatient Medications on File Prior to Visit  Medication Sig Dispense Refill  . ibuprofen (ADVIL) 600 MG tablet Take 1 tablet (600 mg total) by mouth every 8 (eight) hours as needed. 15 tablet 0  . JUNEL 1.5/30 1.5-30 MG-MCG tablet TAKE 1 TABLET BY MOUTH EVERY DAY 84 tablet 0  . traMADol (ULTRAM) 50 MG tablet Take 1 tablet (50 mg total) by mouth every 6 (six) hours as needed for moderate pain. 12 tablet 0   No current facility-administered medications on file prior to visit.    Allergies  Allergen Reactions  . Morphine And Related Other (See Comments)    Makes "veins stand up"  . Latex Rash and Other (See Comments)    Watery eyes      Review of Systems Constitutional: negative for chills, fatigue, fevers and sweats Eyes: negative for irritation, redness and visual disturbance Ears, nose, mouth, throat, and face: negative for hearing loss, nasal congestion, snoring and tinnitus Respiratory: negative for asthma, cough, sputum Cardiovascular: negative for chest pain, dyspnea, exertional chest pressure/discomfort, irregular heart beat, palpitations and syncope Gastrointestinal: negative for abdominal pain, change in bowel habits, nausea and vomiting Genitourinary: positive for irregular menstrual periods.  No genital lesions, sexual problems and vaginal discharge, dysuria and urinary incontinence Integument/breast: negative for breast lump, and nipple discharge. Positive for breast tenderness Hematologic/lymphatic: negative for bleeding and easy bruising Musculoskeletal:negative for back pain and muscle weakness Neurological: negative for dizziness, headaches, vertigo and weakness Endocrine: negative for diabetic symptoms including polydipsia, polyuria and skin dryness Allergic/Immunologic: negative for hay  fever and urticaria      Objective:  Blood pressure 118/74, pulse 75, height 5\' 5"  (1.651 m), weight 235 lb 1.6 oz (106.6 kg). Body mass index is 39.12 kg/m.  General Appearance:    Alert, cooperative, no distress, appears stated age, morbid obesity  Head:    Normocephalic, without obvious abnormality, atraumatic  Eyes:    PERRL, conjunctiva/corneas clear, EOM's intact, both eyes  Ears:    Normal external ear canals, both ears  Nose:   Nares normal, septum midline, mucosa normal, no drainage or sinus tenderness  Throat:   Lips, mucosa, and tongue normal; teeth and gums normal  Neck:   Supple, symmetrical, trachea midline, no adenopathy; thyroid: no enlargement/tenderness/nodules; no carotid bruit or JVD  Back:     Symmetric, no curvature, ROM normal, no CVA tenderness  Lungs:  Clear to auscultation bilaterally, respirations unlabored  Chest Wall:    No tenderness or deformity   Heart:    Regular rate and rhythm, S1 and S2 normal, no murmur, rub or gallop  Breast Exam:    No tenderness, masses, or nipple abnormality  Abdomen:     Soft, non-tender, bowel sounds active all four quadrants, no masses, no organomegaly.    Genitalia:    Pelvic:external genitalia normal, vagina without lesions, discharge, or tenderness, rectovaginal septum  normal. Cervix normal in appearance, no cervical motion tenderness, no adnexal masses or tenderness.  Uterus normal size, shape, mobile, regular contours, nontender.  Rectal:    Normal external sphincter.  No hemorrhoids appreciated. Internal exam not done.   Extremities:   Extremities normal, atraumatic, no cyanosis or edema  Pulses:   2+ and symmetric all extremities  Skin:   Skin color, texture, turgor normal, no rashes or lesions  Lymph nodes:   Cervical, supraclavicular, and axillary nodes normal  Neurologic:   CNII-XII intact, normal strength, sensation and reflexes throughout   .  Labs:  Lab Results  Component Value Date   WBC 8.0 08/05/2017    HGB 10.9 (L) 08/05/2017   HCT 32.3 (L) 08/05/2017   MCV 79.3 (L) 08/05/2017   PLT 263 08/05/2017    Lab Results  Component Value Date   CREATININE 0.64 08/05/2017   BUN 6 08/05/2017   NA 140 08/05/2017   K 4.4 08/05/2017   CL 106 08/05/2017   CO2 25 08/05/2017    Lab Results  Component Value Date   ALT 85 (H) 08/05/2017   AST 50 (H) 08/05/2017   ALKPHOS 113 08/05/2017   BILITOT 0.5 08/05/2017    Lab Results  Component Value Date   TSH 0.628 10/02/2019     Results for orders placed or performed in visit on 11/30/20  POCT urine pregnancy  Result Value Ref Range   Preg Test, Ur Positive (A) Negative     Assessment:   1. Encounter for well woman exam with routine gynecological exam   2. Pap smear for cervical cancer screening   3. Obesity (BMI 35.0-39.9 without comorbidity)   4. History of gestational diabetes   5. Screening for lipid disorders   6. Incidental pregnancy confirmed      Plan:    - Blood tests: CBC with diff, Comprehensive metabolic panel, Lipoproteins, TSH and HgbA1c . - Breast self exam technique reviewed and patient encouraged to perform self-exam monthly. - Contraception: OCP (estrogen/progesterone), but desires to transition to Nexplanon.  UPT done today in office positive. Patient denies missing any doses of her contraception.  Will order BHCG level to assess gestational age range. Likely no more than 3-[redacted] weeks pregnant.   - Discussed healthy lifestyle modifications. - Pap smear performed today.  - Received COVID vaccination series, and received booster yesterday.  - Flu vaccine: will hold for now due to new pregnancy.  - Follow up based on hormonal pregnancy levels. Will need a dating/viability scan at 6-8 weeks as patient also with a h/o ectopic pregnancy in the past. Return for annual exam in 1 year.      Hildred Laser, MD [facility]

## 2020-11-30 NOTE — Progress Notes (Signed)
Annual Exam-Pt reports breast tenderness x 5 days. Pt would like to discuss other forms of birth control like Nexplanon.

## 2020-12-01 LAB — COMPREHENSIVE METABOLIC PANEL
ALT: 36 IU/L — ABNORMAL HIGH (ref 0–32)
AST: 20 IU/L (ref 0–40)
Albumin/Globulin Ratio: 2.1 (ref 1.2–2.2)
Albumin: 4.5 g/dL (ref 3.9–5.0)
Alkaline Phosphatase: 93 IU/L (ref 44–121)
BUN/Creatinine Ratio: 16 (ref 9–23)
BUN: 10 mg/dL (ref 6–20)
Bilirubin Total: 0.6 mg/dL (ref 0.0–1.2)
CO2: 22 mmol/L (ref 20–29)
Calcium: 9.5 mg/dL (ref 8.7–10.2)
Chloride: 103 mmol/L (ref 96–106)
Creatinine, Ser: 0.63 mg/dL (ref 0.57–1.00)
GFR calc Af Amer: 139 mL/min/{1.73_m2} (ref >59)
GFR calc non Af Amer: 121 mL/min/{1.73_m2} (ref >59)
Globulin, Total: 2.1 g/dL (ref 1.5–4.5)
Glucose: 82 mg/dL (ref 65–99)
Potassium: 4.5 mmol/L (ref 3.5–5.2)
Sodium: 138 mmol/L (ref 134–144)
Total Protein: 6.6 g/dL (ref 6.0–8.5)

## 2020-12-01 LAB — CBC
Hematocrit: 38.6 % (ref 34.0–46.6)
Hemoglobin: 13.3 g/dL (ref 11.1–15.9)
MCH: 29.7 pg (ref 26.6–33.0)
MCHC: 34.5 g/dL (ref 31.5–35.7)
MCV: 86 fL (ref 79–97)
Platelets: 266 10*3/uL (ref 150–450)
RBC: 4.48 x10E6/uL (ref 3.77–5.28)
RDW: 12.2 % (ref 11.7–15.4)
WBC: 7.9 10*3/uL (ref 3.4–10.8)

## 2020-12-01 LAB — LIPID PANEL
Chol/HDL Ratio: 2.4 ratio (ref 0.0–4.4)
Cholesterol, Total: 155 mg/dL (ref 100–199)
HDL: 65 mg/dL (ref >39)
LDL Chol Calc (NIH): 74 mg/dL (ref 0–99)
Triglycerides: 88 mg/dL (ref 0–149)
VLDL Cholesterol Cal: 16 mg/dL (ref 5–40)

## 2020-12-01 LAB — HEPATITIS C ANTIBODY: Hep C Virus Ab: <0.1 s/co ratio (ref 0.0–0.9)

## 2020-12-01 LAB — HEMOGLOBIN A1C
Est. average glucose Bld gHb Est-mCnc: 103 mg/dL
Hgb A1c MFr Bld: 5.2 % (ref 4.8–5.6)

## 2020-12-01 LAB — HUMAN CHORIONIC GONADOTROPIN(HCG),B-SUBUNIT,QUANTITATIVE): HCG, Beta Chain, Quant, S: 2496 m[IU]/mL

## 2020-12-01 LAB — TSH: TSH: 0.89 u[IU]/mL (ref 0.450–4.500)

## 2020-12-01 NOTE — Addendum Note (Signed)
Addended by: Fabian November on: 12/01/2020 12:57 PM   Modules accepted: Orders

## 2020-12-01 NOTE — Progress Notes (Signed)
Pt is aware of test results and appointment scheduled for ultrasound

## 2020-12-08 LAB — CYTOLOGY - PAP
Comment: NEGATIVE
Diagnosis: NEGATIVE
High risk HPV: NEGATIVE

## 2020-12-09 ENCOUNTER — Ambulatory Visit (INDEPENDENT_AMBULATORY_CARE_PROVIDER_SITE_OTHER): Payer: 59

## 2020-12-09 ENCOUNTER — Other Ambulatory Visit: Payer: Self-pay

## 2020-12-09 DIAGNOSIS — Z789 Other specified health status: Secondary | ICD-10-CM

## 2020-12-09 DIAGNOSIS — Z3A01 Less than 8 weeks gestation of pregnancy: Secondary | ICD-10-CM | POA: Diagnosis not present

## 2021-01-03 ENCOUNTER — Ambulatory Visit (INDEPENDENT_AMBULATORY_CARE_PROVIDER_SITE_OTHER): Payer: 59 | Admitting: Obstetrics and Gynecology

## 2021-01-03 ENCOUNTER — Other Ambulatory Visit: Payer: Self-pay

## 2021-01-03 VITALS — BP 131/83 | HR 84 | Ht 65.0 in | Wt 237.7 lb

## 2021-01-03 DIAGNOSIS — T7589XA Other specified effects of external causes, initial encounter: Secondary | ICD-10-CM

## 2021-01-03 DIAGNOSIS — Z202 Contact with and (suspected) exposure to infections with a predominantly sexual mode of transmission: Secondary | ICD-10-CM | POA: Diagnosis not present

## 2021-01-03 DIAGNOSIS — Z6838 Body mass index (BMI) 38.0-38.9, adult: Secondary | ICD-10-CM

## 2021-01-03 DIAGNOSIS — O99341 Other mental disorders complicating pregnancy, first trimester: Secondary | ICD-10-CM

## 2021-01-03 DIAGNOSIS — Z3481 Encounter for supervision of other normal pregnancy, first trimester: Secondary | ICD-10-CM | POA: Diagnosis not present

## 2021-01-03 DIAGNOSIS — F419 Anxiety disorder, unspecified: Secondary | ICD-10-CM

## 2021-01-03 LAB — OB RESULTS CONSOLE GC/CHLAMYDIA: Gonorrhea: NEGATIVE

## 2021-01-03 LAB — OB RESULTS CONSOLE VARICELLA ZOSTER ANTIBODY, IGG: Varicella: IMMUNE

## 2021-01-03 MED ORDER — SERTRALINE HCL 50 MG PO TABS: 50.0000 mg | ORAL_TABLET | Freq: Every day | ORAL | 1 refills | Status: DC

## 2021-01-03 NOTE — Patient Instructions (Signed)
[phone] and Pregnancy  Pregnant and recently pregnant women should take steps to stay healthy, including . getting a COVID-19 vaccine  . following guidelines from health officials for when to wear a mask and take other steps to prevent infection . keeping your prenatal and postpartum care visits . talking with an OB-GYN or other health care professional if you have any questions about your health or COVID-19 . calling 911 or going to the hospital right away if you need emergency health care   If you think you may have been exposed to the coronavirus and have a fever or cough, call your ob-gyn or other health care professional for advice. If you have emergency warning signs, call 911 or go to the hospital right away. Emergency warning signs include the following: . Having a hard time breathing or shortness of breath (more than what has been normal for you during pregnancy) . Ongoing pain or pressure in the chest . Sudden confusion . Being unable to respond to others . Blue lips or face . Decreased fetal movement/absent of fetal movement . Fever greater than 100.4  If you go to the hospital, try to call ahead to let them know you are coming so they can prepare. If you have other symptoms that worry you, call your OB-GYN or 911.   If you are diagnosed with COVID-19, follow the advice from the [facility] and your ob-gyn or other health care professional. The current CDC advice for all people with COVID-19 includes the following: . Stay home except to get medical care. Avoid public transportation. Marland Kitchen Speak with your health care team over the phone before going to their office. Get medical care right away if you feel worse or think it's an emergency. . Separate yourself from other people in your home. . Wear a face mask when you are around other people and when you go to get medical care. . Use the safe medication list to treat the symptoms i.e., cough, congestion, sore throat, fever. . If you are  having nausea and are unable to hold down liquids or food contact the office as we can prescribe medication. . Stay hydrate. Frequent sips of water, broth, ice chips, and popsicle. . Small bland meals.  . Hand hygiene- Wash hands frequently and/or use hand sanitizer.  . Wipe down surfaces.    For additional information please visit the web site below.    [email or web address]  Managing Anxiety, Adult After being diagnosed with an anxiety disorder, you may be relieved to know why you have felt or behaved a certain way. You may also feel overwhelmed about the treatment ahead and what it will mean for your life. With care and support, you can manage this condition and recover from it. How to manage lifestyle changes Managing stress and anxiety Stress is your body's reaction to life changes and events, both good and bad. Most stress will last just a few hours, but stress can be ongoing and can lead to more than just stress. Although stress can play a major role in anxiety, it is not the same as anxiety. Stress is usually caused by something external, such as a deadline, test, or competition. Stress normally passes after the triggering event has ended.  Anxiety is caused by something internal, such as imagining a terrible outcome or worrying that something will go wrong that will devastate you. Anxiety often does not go away even after the triggering event is over, and it can become long-term (chronic) worry. It  is important to understand the differences between stress and anxiety and to manage your stress effectively so that it does not lead to an anxious response. Talk with your health care provider or a counselor to learn more about reducing anxiety and stress. He or she may suggest tension reduction techniques, such as:  Music therapy. This can include creating or listening to music that you enjoy and that inspires you.  Mindfulness-based meditation. This involves being aware of your normal  breaths while not trying to control your breathing. It can be done while sitting or walking.  Centering prayer. This involves focusing on a word, phrase, or sacred image that means something to you and brings you peace.  Deep breathing. To do this, expand your stomach and inhale slowly through your nose. Hold your breath for 3-5 seconds. Then exhale slowly, letting your stomach muscles relax.  Self-talk. This involves identifying thought patterns that lead to anxiety reactions and changing those patterns.  Muscle relaxation. This involves tensing muscles and then relaxing them. Choose a tension reduction technique that suits your lifestyle and personality. These techniques take time and practice. Set aside 5-15 minutes a day to do them. Therapists can offer counseling and training in these techniques. The training to help with anxiety may be covered by some insurance plans. Other things you can do to manage stress and anxiety include:  Keeping a stress/anxiety diary. This can help you learn what triggers your reaction and then learn ways to manage your response.  Thinking about how you react to certain situations. You may not be able to control everything, but you can control your response.  Making time for activities that help you relax and not feeling guilty about spending your time in this way.  Visual imagery and yoga can help you stay calm and relax.   Medicines Medicines can help ease symptoms. Medicines for anxiety include:  Anti-anxiety drugs.  Antidepressants. Medicines are often used as a primary treatment for anxiety disorder. Medicines will be prescribed by a health care provider. When used together, medicines, psychotherapy, and tension reduction techniques may be the most effective treatment. Relationships Relationships can play a big part in helping you recover. Try to spend more time connecting with trusted friends and family members. Consider going to couples counseling,  taking family education classes, or going to family therapy. Therapy can help you and others better understand your condition. How to recognize changes in your anxiety Everyone responds differently to treatment for anxiety. Recovery from anxiety happens when symptoms decrease and stop interfering with your daily activities at home or work. This may mean that you will start to:  Have better concentration and focus. Worry will interfere less in your daily thinking.  Sleep better.  Be less irritable.  Have more energy.  Have improved memory. It is important to recognize when your condition is getting worse. Contact your health care provider if your symptoms interfere with home or work and you feel like your condition is not improving. Follow these instructions at home: Activity  Exercise. Most adults should do the following: ? Exercise for at least 150 minutes each week. The exercise should increase your heart rate and make you sweat (moderate-intensity exercise). ? Strengthening exercises at least twice a week.  Get the right amount and quality of sleep. Most adults need 7-9 hours of sleep each night. Lifestyle  Eat a healthy diet that includes plenty of vegetables, fruits, whole grains, low-fat dairy products, and lean protein. Do not eat a  lot of foods that are high in solid fats, added sugars, or salt.  Make choices that simplify your life.  Do not use any products that contain nicotine or tobacco, such as cigarettes, e-cigarettes, and chewing tobacco. If you need help quitting, ask your health care provider.  Avoid caffeine, alcohol, and certain over-the-counter cold medicines. These may make you feel worse. Ask your pharmacist which medicines to avoid.   General instructions  Take over-the-counter and prescription medicines only as told by your health care provider.  Keep all follow-up visits as told by your health care provider. This is important. Where to find support You  can get help and support from these sources:  Self-help groups.  Online and [employer].  A trusted spiritual leader.  Couples counseling.  Family education classes.  Family therapy. Where to find more information You may find that joining a support group helps you deal with your anxiety. The following sources can help you locate counselors or support groups near you:  [place]: www.mentalhealth[place].net  Anxiety and Depression Association of [place] (ADAA): https://www.clark.net/  National Alliance on Mental Illness (NAMI): www.nami.org Contact a health care provider if you:  Have a hard time staying focused or finishing daily tasks.  Spend many hours a day feeling worried about everyday life.  Become exhausted by worry.  Start to have headaches, feel tense, or have nausea.  Urinate more than normal.  Have diarrhea. Get help right away if you have:  A racing heart and shortness of breath.  Thoughts of hurting yourself or others. If you ever feel like you may hurt yourself or others, or have thoughts about taking your own life, get help right away. You can go to your nearest emergency department or call:  Your local emergency services (911 in the [place]  A suicide crisis helpline, such as the [place] at (239)633-6333. This is open 24 hours a day. Summary  Taking steps to learn and use tension reduction techniques can help calm you and help prevent triggering an anxiety reaction.  When used together, medicines, psychotherapy, and tension reduction techniques may be the most effective treatment.  Family, friends, and partners can play a big part in helping you recover from an anxiety disorder. This information is not intended to replace advice given to you by your health care provider. Make sure you discuss any questions you have with your health care provider. Document Revised: 04/15/2019 Document Reviewed:  04/15/2019 Elsevier Patient Education  [place].   WHAT OB PATIENTS CAN EXPECT   Confirmation of pregnancy and ultrasound ordered if medically indicated-[redacted] weeks gestation  New OB (NOB) intake with nurse and New OB (NOB) labs- [redacted] weeks gestation  New OB (NOB) physical examination with provider- 11/[redacted] weeks gestation  Flu vaccine-[redacted] weeks gestation  Anatomy scan-[redacted] weeks gestation  Glucose tolerance test, blood work to test for anemia, T-dap vaccine-[redacted] weeks gestation  Vaginal swabs/cultures-STD/Group B strep-[redacted] weeks gestation  Appointments every 4 weeks until 28 weeks  Every 2 weeks from 28 weeks until 36 weeks  Weekly visits from 36 weeks until delivery  Morning Sickness  Morning sickness is when you feel like you may vomit (feel nauseous) during pregnancy. Sometimes, you may vomit. Morning sickness most often happens in the morning, but it can also happen at any time of the day. Some women may have morning sickness that makes them vomit all the time. This is a more serious problem that needs treatment. What are the causes?  The cause of this condition is not known. What increases the risk?  You had vomiting or a feeling like you may vomit before your pregnancy.  You had morning sickness in another pregnancy.  You are pregnant with more than one baby, such as twins. What are the signs or symptoms?  Feeling like you may vomit.  Vomiting. How is this treated? Treatment is usually not needed for this condition. You may only need to change what you eat. In some cases, your doctor may give you some things to take for your condition. These include:  Vitamin B6 supplements.  Medicines to treat the feeling that you may vomit.  Ginger. Follow these instructions at home: Medicines  Take over-the-counter and prescription medicines only as told by your doctor. Do not take any medicines until you talk with your doctor about them first.  Take multivitamins before  you get pregnant. These can stop or lessen the symptoms of morning sickness. Eating and drinking  Eat dry toast or crackers before getting out of bed.  Eat 5 or 6 small meals a day.  Eat dry and bland foods like rice and baked potatoes.  Do not eat greasy, fatty, or spicy foods.  Have someone cook for you if the smell of food causes you to vomit or to feel like you may vomit.  If you feel like you may vomit after taking prenatal vitamins, take them at night or with a snack.  Eat protein foods when you need a snack. Nuts, yogurt, and cheese are good choices.  Drink fluids throughout the day.  Try ginger ale made with real ginger, ginger tea made from fresh grated ginger, or ginger candies. General instructions  Do not smoke or use any products that contain nicotine or tobacco. If you need help quitting, ask your doctor.  Use an air purifier to keep the air in your house free of smells.  Get lots of fresh air.  Try to avoid smells that make you feel sick.  Try wearing an acupressure wristband. This is a wristband that is used to treat seasickness.  Try a treatment called acupuncture. In this treatment, a doctor puts needles into certain areas of your body to make you feel better. Contact a doctor if:  You need medicine to feel better.  You feel dizzy or light-headed.  You are losing weight. Get help right away if:  The feeling that you may vomit will not go away, or you cannot stop vomiting.  You faint.  You have very bad pain in your belly. Summary  Morning sickness is when you feel like you may vomit (feel nauseous) during pregnancy.  You may feel sick in the morning, but you can feel this way at any time of the day.  Making some changes to what you eat may help your symptoms go away. This information is not intended to replace advice given to you by your health care provider. Make sure you discuss any questions you have with your health care provider. Document  Revised: 06/28/2020 Document Reviewed: 06/07/2020 Elsevier Patient Education  2021 [employer]. How a Baby Grows During Pregnancy Pregnancy begins when a female's sperm enters a female's egg. This is called fertilization. Fertilization usually happens in one of the fallopian tubes that connect the ovaries to the uterus. The fertilized egg moves down the fallopian tube to the uterus. Once it reaches the uterus, it implants into the lining of the uterus and begins to grow. For the first 8 weeks,  the fertilized egg is called an embryo. After 8 weeks, it is called a fetus. As the fetus continues to grow, it receives oxygen and nutrients through the placenta, which is an organ that grows to support the developing baby. The placenta is the life support system for the baby. It provides oxygen and nutrition and removes waste. How long does a typical pregnancy last? A pregnancy usually lasts 280 days, or about 40 weeks. Pregnancy is divided into three periods of growth, also called trimesters:  First trimester: 0-12 weeks.  Second trimester: 13-27 weeks.  Third trimester: 28-40 weeks. The day when your baby is ready to be born (full term) is your estimated date of delivery. However, most babies are not born on their estimated date of delivery. How does my baby develop month by month? First month  The fertilized egg attaches to the inside of the uterus.  Some cells will form the placenta. Others will form the fetus.  The arms, legs, brain, spinal cord, lungs, and heart begin to develop.  At the end of the first month, the heart begins to beat. Second month  The bones, inner ear, eyelids, hands, and feet form.  The genitals develop.  By the end of 8 weeks, all major organs are developing. Third month  All of the internal organs are forming.  Teeth develop below the gums.  Bones and muscles begin to grow. The spine can flex.  The skin is transparent.  Fingernails and toenails begin to  form.  Arms and legs continue to grow longer, and hands and feet develop.  The fetus is about 3 inches (7.6 cm) long. Fourth month  The placenta is completely formed.  The external sex organs, neck, outer ear, eyebrows, eyelids, and fingernails are formed.  The fetus can hear, swallow, and move its arms and legs.  The kidneys begin to produce urine.  The skin is covered with a white, waxy coating (vernix) and very fine hair (lanugo). Fifth month  The fetus moves around more and can be felt for the first time (quickening).  The fetus starts to sleep and wake up and may begin to suck a finger.  The nails grow to the end of the fingers.  The organ in the digestive system that makes bile (gallbladder) functions and helps to digest nutrients.  If the fetus is a female, eggs are present in the ovaries. If the fetus is a female, testicles start to move down into the scrotum. Sixth month  The lungs are formed.  The eyes open. The brain continues to develop.  Your baby has fingerprints and toe prints. Your baby's hair grows thicker.  At the end of the second trimester, the fetus is about 9 inches (22.9 cm) long. Seventh month  The fetus kicks and stretches.  The eyes are developed enough to sense changes in light.  The hands can make a grasping motion.  The fetus responds to sound. Eighth month  Most organs and body systems are fully developed and functioning.  Bones harden, and taste buds develop. The fetus may hiccup.  Certain areas of the brain are still developing. The skull remains soft. Ninth month  The fetus gains about  lb (0.23 kg) each week.  The lungs are fully developed.  Patterns of sleep develop.  The fetus's head typically moves into a head-down position (vertex) in the uterus to prepare for birth.  The fetus weighs 6-9 lb (2.72-4.08 kg) and is 19-20 inches (48.26-50.8 cm) long.  How do I know if my baby is developing well? Always talk with your  health care provider about any concerns that you may have about your pregnancy and your baby. At each prenatal visit, your health care provider will do several different tests to check on your health and keep track of your baby's development. These include:  Fundal height and position. To do this, your health care provider will: ? Measure your growing belly from your pubic bone to the top of the uterus using a tape measure. ? Feel your belly to determine your baby's position.  Heartbeat. An ultrasound in the first trimester can confirm pregnancy and show a heartbeat, depending on how far along you are. Your health care provider will check your baby's heart rate at every prenatal visit. You will also have a second trimester ultrasound to check your baby's development. Follow these instructions at home:  Take prenatal vitamins as told by your health care provider. These include vitamins such as folic acid, iron, calcium, and vitamin D. They are important for healthy development.  Take over-the-counter and prescription medicines only as told by your health care provider.  Keep all follow-up visits. This is important. Follow-up visits include prenatal care and screening tests. Summary  A pregnancy usually lasts 280 days, or about 40 weeks. Pregnancy is divided into three periods of growth, also called trimesters.  Your health care provider will monitor your baby's growth and development throughout your pregnancy.  Follow your health care provider's recommendations about taking prenatal vitamins and medicines during your pregnancy.  Talk with your health care provider if you have any concerns about your pregnancy or your developing baby. This information is not intended to replace advice given to you by your health care provider. Make sure you discuss any questions you have with your health care provider. Document Revised: 04/21/2020 Document Reviewed: 02/26/2020 Elsevier Patient Education   [place]. Obstetrics: Normal and Problem Pregnancies (7th ed., pp. 102-121). [place], [place]: Elsevier."> Textbook of Family Medicine (9th ed., pp. [phone]). [place], [place]: Elsevier [place].">  First Trimester of Pregnancy  The first trimester of pregnancy starts on the first day of your last menstrual period until the end of week 12. This is months 1 through 3 of pregnancy. A week after a sperm fertilizes an egg, the egg will implant into the wall of the uterus and begin to develop into a baby. By the end of 12 weeks, all the baby's organs will be formed and the baby will be 2-3 inches in size. Body changes during your first trimester Your body goes through many changes during pregnancy. The changes vary and generally return to normal after your baby is born. Physical changes  You may gain or lose weight.  Your breasts may begin to grow larger and become tender. The tissue that surrounds your nipples (areola) may become darker.  Dark spots or blotches (chloasma or mask of pregnancy) may develop on your face.  You may have changes in your hair. These can include thickening or thinning of your hair or changes in texture. Health changes  You may feel nauseous, and you may vomit.  You may have heartburn.  You may develop headaches.  You may develop constipation.  Your gums may bleed and may be sensitive to brushing and flossing. Other changes  You may tire easily.  You may urinate more often.  Your menstrual periods will stop.  You may have a loss of appetite.  You may develop cravings for certain  kinds of food.  You may have changes in your emotions from day to day.  You may have more vivid and strange dreams. Follow these instructions at home: Medicines  Follow your health care provider's instructions regarding medicine use. Specific medicines may be either safe or unsafe to take during pregnancy. Do not take any medicines unless told to by your health care  provider.  Take a prenatal vitamin that contains at least 600 micrograms (mcg) of folic acid. Eating and drinking  Eat a healthy diet that includes fresh fruits and vegetables, whole grains, good sources of protein such as meat, eggs, or tofu, and low-fat dairy products.  Avoid raw meat and unpasteurized juice, milk, and cheese. These carry germs that can harm you and your baby.  If you feel nauseous or you vomit: ? Eat 4 or 5 small meals a day instead of 3 large meals. ? Try eating a few soda crackers. ? Drink liquids between meals instead of during meals.  You may need to take these actions to prevent or treat constipation: ? Drink enough fluid to keep your urine pale yellow. ? Eat foods that are high in fiber, such as beans, whole grains, and fresh fruits and vegetables. ? Limit foods that are high in fat and processed sugars, such as fried or sweet foods. Activity  Exercise only as directed by your health care provider. Most people can continue their usual exercise routine during pregnancy. Try to exercise for 30 minutes at least 5 days a week.  Stop exercising if you develop pain or cramping in the lower abdomen or lower back.  Avoid exercising if it is very hot or humid or if you are at high altitude.  Avoid heavy lifting.  If you choose to, you may have sex unless your health care provider tells you not to. Relieving pain and discomfort  Wear a good support bra to relieve breast tenderness.  Rest with your legs elevated if you have leg cramps or low back pain.  If you develop bulging veins (varicose veins) in your legs: ? Wear support hose as told by your health care provider. ? Elevate your feet for 15 minutes, 3-4 times a day. ? Limit salt in your diet. Safety  Wear your seat belt at all times when driving or riding in a car.  Talk with your health care provider if someone is verbally or physically abusive to you.  Talk with your health care provider if you are  feeling sad or have thoughts of hurting yourself. Lifestyle  Do not use hot tubs, steam rooms, or saunas.  Do not douche. Do not use tampons or scented sanitary pads.  Do not use herbal remedies, alcohol, illegal drugs, or medicines that are not approved by your health care provider. Chemicals in these products can harm your baby.  Do not use any products that contain nicotine or tobacco, such as cigarettes, e-cigarettes, and chewing tobacco. If you need help quitting, ask your health care provider.  Avoid cat litter boxes and soil used by cats. These carry germs that can cause birth defects in the baby and possibly loss of the unborn baby (fetus) by miscarriage or stillbirth. General instructions  During routine prenatal visits in the first trimester, your health care provider will do a physical exam, perform necessary tests, and ask you how things are going. Keep all follow-up visits. This is important.  Ask for help if you have counseling or nutritional needs during pregnancy. Your health care provider  can offer advice or refer you to specialists for help with various needs.  Schedule a dentist appointment. At home, brush your teeth with a soft toothbrush. Floss gently.  Write down your questions. Take them to your prenatal visits. Where to find more information  American Pregnancy Association: americanpregnancy.[place] and Gynecologists: PoolDevices.com.pt  Office on [employer] [facility]: KeywordPortfolios.com.br Contact a health care provider if you have:  Dizziness.  A fever.  Mild pelvic cramps, pelvic pressure, or nagging pain in the abdominal area.  Nausea, vomiting, or diarrhea that lasts for 24 hours or longer.  A bad-smelling vaginal discharge.  Pain when you urinate.  Known exposure to a contagious illness, such as chickenpox, measles, Zika virus, HIV, or hepatitis. Get help right away if you have:  Spotting or  bleeding from your vagina.  Severe abdominal cramping or pain.  Shortness of breath or chest pain.  Any kind of trauma, such as from a fall or a car crash.  New or increased pain, swelling, or redness in an arm or leg. Summary  The first trimester of pregnancy starts on the first day of your last menstrual period until the end of week 12 (months 1 through 3).  Eating 4 or 5 small meals a day rather than 3 large meals may help to relieve nausea and vomiting.  Do not use any products that contain nicotine or tobacco, such as cigarettes, e-cigarettes, and chewing tobacco. If you need help quitting, ask your health care provider.  Keep all follow-up visits. This is important. This information is not intended to replace advice given to you by your health care provider. Make sure you discuss any questions you have with your health care provider. Document Revised: 04/21/2020 Document Reviewed: 02/26/2020 Elsevier Patient Education  2021 [place]. Commonly Asked Questions During Pregnancy  Cats: A parasite can be excreted in cat feces.  To avoid exposure you need to have another person empty the little box.  If you must empty the litter box you will need to wear gloves.  Wash your hands after handling your cat.  This parasite can also be found in raw or undercooked meat so this should also be avoided.  Colds, Sore Throats, Flu: Please check your medication sheet to see what you can take for symptoms.  If your symptoms are unrelieved by these medications please call the office.  Dental Work: Most any dental work Investment banker, corporate recommends is permitted.  X-rays should only be taken during the first trimester if absolutely necessary.  Your abdomen should be shielded with a lead apron during all x-rays.  Please notify your provider prior to receiving any x-rays.  Novocaine is fine; gas is not recommended.  If your dentist requires a note from [place] prior to dental work please call the office and we  will provide one for you.  Exercise: Exercise is an important part of staying healthy during your pregnancy.  You may continue most exercises you were accustomed to prior to pregnancy.  Later in your pregnancy you will most likely notice you have difficulty with activities requiring balance like riding a bicycle.  It is important that you listen to your body and avoid activities that put you at a higher risk of falling.  Adequate rest and staying well hydrated are a must!  If you have questions about the safety of specific activities ask your provider.    Exposure to Children with illness: Try to avoid obvious exposure; report any symptoms to  [place] when noted,  If you have chicken pos, red measles or mumps, you should be immune to these diseases.   Please do not take any vaccines while pregnant unless you have checked with your OB provider.  Fetal Movement: After 28 weeks we recommend you do "kick counts" twice daily.  Lie or sit down in a calm quiet environment and count your baby movements "kicks".  You should feel your baby at least 10 times per hour.  If you have not felt 10 kicks within the first hour get up, walk around and have something sweet to eat or drink then repeat for an additional hour.  If count remains less than 10 per hour notify your provider.  Fumigating: Follow your pest control agent's advice as to how long to stay out of your home.  Ventilate the area well before re-entering.  Hemorrhoids:   Most over-the-counter preparations can be used during pregnancy.  Check your medication to see what is safe to use.  It is important to use a stool softener or fiber in your diet and to drink lots of liquids.  If hemorrhoids seem to be getting worse please call the office.   Hot Tubs:  Hot tubs Jacuzzis and saunas are not recommended while pregnant.  These increase your internal body temperature and should be avoided.  Intercourse:  Sexual intercourse is safe during pregnancy as long as you are  comfortable, unless otherwise advised by your provider.  Spotting may occur after intercourse; report any bright red bleeding that is heavier than spotting.  Labor:  If you know that you are in labor, please go to the hospital.  If you are unsure, please call the office and let us help you decide what to do.  Lifting, straining, etc:  If your job requires heavy lifting or straining please check with your provider for any limitations.  Generally, you should not lift items heavier than that you can lift simply with your hands and arms (no back muscles)  Painting:  Paint fumes do not harm your pregnancy, but may make you ill and should be avoided if possible.  Latex or water based paints have less odor than oils.  Use adequate ventilation while painting.  Permanents & Hair Color:  Chemicals in hair dyes are not recommended as they cause increase hair dryness which can increase hair loss during pregnancy.  " Highlighting" and permanents are allowed.  Dye may be absorbed differently and permanents may not hold as well during pregnancy.  Sunbathing:  Use a sunscreen, as skin burns easily during pregnancy.  Drink plenty of fluids; avoid over heating.  Tanning Beds:  Because their possible side effects are still unknown, tanning beds are not recommended.  Ultrasound Scans:  Routine ultrasounds are performed at approximately 20 weeks.  You will be able to see your baby's general anatomy an if you would like to know the gender this can usually be determined as well.  If it is questionable when you conceived you may also receive an ultrasound early in your pregnancy for dating purposes.  Otherwise ultrasound exams are not routinely performed unless there is a medical necessity.  Although you can request a scan we ask that you pay for it when conducted because insurance does not cover " patient request" scans.  Work: If your pregnancy proceeds without complications you may work until your due date, unless your  physician or employer advises otherwise.  Round Ligament Pain/Pelvic Discomfort:  Sharp, shooting pains not associated with  bleeding are fairly common, usually occurring in the second trimester of pregnancy.  They tend to be worse when standing up or when you remain standing for long periods of time.  These are the result of pressure of certain pelvic ligaments called "round ligaments".  Rest, Tylenol and heat seem to be the most effective relief.  As the womb and fetus grow, they rise out of the pelvis and the discomfort improves.  Please notify the office if your pain seems different than that described.  It may represent a more serious condition.  Common Medications Safe in Pregnancy  Acne:      Constipation:  Benzoyl Peroxide     Colace  Clindamycin      Dulcolax Suppository  Topica Erythromycin     Fibercon  Salicylic Acid      Metamucil         Miralax AVOID:        Senakot   Accutane    Cough:  Retin-A       Cough Drops  Tetracycline      Phenergan w/ Codeine if Rx  Minocycline      Robitussin (Plain & DM)  Antibiotics:     Crabs/Lice:  Ceclor       RID  Cephalosporins    AVOID:  E-Mycins      Kwell  Keflex  Macrobid/Macrodantin   Diarrhea:  Penicillin      Kao-Pectate  Zithromax      Imodium AD         PUSH FLUIDS AVOID:       Cipro     Fever:  Tetracycline      Tylenol (Regular or Extra  Minocycline       Strength)  Levaquin      Extra Strength-Do not          Exceed 8 tabs/24 hrs Caffeine:        <252m/day (equiv. To 1 cup of coffee or  approx. 3 12 oz sodas)         Gas: Cold/Hayfever:       Gas-X  Benadryl      Mylicon  Claritin       Phazyme  **Claritin-D        Chlor-Trimeton    Headaches:  Dimetapp      ASA-Free Excedrin  Drixoral-Non-Drowsy     Cold Compress  Mucinex (Guaifenasin)     Tylenol (Regular or Extra  Sudafed/Sudafed-12 Hour     Strength)  **Sudafed PE Pseudoephedrine   Tylenol Cold & Sinus     Vicks Vapor Rub  Zyrtec  **AVOID if Problems  With Blood Pressure         Heartburn: Avoid lying down for at least 1 hour after meals  Aciphex      Maalox     Rash:  Milk of Magnesia     Benadryl    Mylanta       1% Hydrocortisone Cream  Pepcid  Pepcid Complete   Sleep Aids:  Prevacid      Ambien   Prilosec       Benadryl  Rolaids       Chamomile Tea  Tums (Limit 4/day)     Unisom         Tylenol PM         Warm milk-add vanilla or  Hemorrhoids:       Sugar for taste  Anusol/Anusol H.C.  (RX: Analapram 2.5%)  Sugar Substitutes:  Hydrocortisone OTC  Ok in moderation  Preparation H      Tucks        Vaseline lotion applied to tissue with wiping    Herpes:     Throat:  Acyclovir      Oragel  Famvir  Valtrex     Vaccines:         Flu Shot Leg Cramps:       *Gardasil  Benadryl      Hepatitis A         Hepatitis B Nasal Spray:       Pneumovax  Saline Nasal Spray     Polio Booster         Tetanus Nausea:       Tuberculosis test or PPD  Vitamin B6 25 mg TID   AVOID:    Dramamine      *Gardasil  Emetrol       Live Poliovirus  Ginger Root 250 mg QID    MMR (measles, mumps &  High Complex Carbs @ Bedtime    rebella)  Sea Bands-Accupressure    Varicella (Chickenpox)  Unisom 1/2 tab TID     *No known complications           If received before Pain:         Known pregnancy;   Darvocet       Resume series after  Lortab        Delivery  Percocet    Yeast:   Tramadol      Femstat  Tylenol 3      Gyne-lotrimin  Ultram       Monistat  Vicodin           MISC:         All Sunscreens           Hair Coloring/highlights          Insect Repellant's          (Including DEET)         [facility]  [place], [place], [place] [address]  Phone: [phone]   [facility] (second location)  [address] [place], [place] [address]  Phone: [phone]   [facility] [place], [place], [place] [address] Phone: [phone]   [place] [address] [place], [place] [address]  Phone: [phone]

## 2021-01-03 NOTE — Progress Notes (Signed)
Spoke to pt concerning her mychart message to the office. Pt stated that she was having a lot anxiety and was unable to cope with her not taking any of her medication for anxiety. GAD-7 was sent to pt via mychart. Pt completed the information and sent it back via mychart.  GAD-7 18.

## 2021-01-03 NOTE — Progress Notes (Signed)
Jennifer Best presents for NOB nurse interview visit. Pregnancy confirmation done ___1/02/2021 with [facility]___.  G- .6  P- 3022  . Pregnancy education material explained and given. _0__ cats in the home. NOB labs ordered. TSH/HbgA1c due to Increased BMI- Body mass index is 39.56 kg/m.  HIV labs and Drug screen were explained optional ordered. PNV encouraged. Genetic screening options discussed. Genetic testing: Unsure.  Pt may discuss with provider. Over the last 3 weeks patient has had episodes of n/v. She feels this week as been better. NO meds needed at this time. She has been using preggie drops. FMLA/form fee explained and signed. OB Financial policy reviewed. Pt sent a message today because of debilitating anxiety for the last 3-4 weeks. GAD= 18. Per Dr. Valentino Saxon ok to start Zoloft 50 mg 1/2 tab for 7 days then whole tab after. Follow up at NOB PE.  Pt. To follow up with provider in _3_ weeks for NOB physical.  All questions answered.

## 2021-01-04 LAB — URINALYSIS, ROUTINE W REFLEX MICROSCOPIC
Bilirubin, UA: NEGATIVE
Glucose, UA: NEGATIVE
Ketones, UA: NEGATIVE
Nitrite, UA: NEGATIVE
Protein,UA: NEGATIVE
RBC, UA: NEGATIVE
Specific Gravity, UA: 1.017 (ref 1.005–1.030)
Urobilinogen, Ur: 0.2 mg/dL (ref 0.2–1.0)
pH, UA: 6.5 (ref 5.0–7.5)

## 2021-01-04 LAB — MONITOR DRUG PROFILE 14(MW)
Amphetamine Scrn, Ur: NEGATIVE ng/mL
BARBITURATE SCREEN URINE: NEGATIVE ng/mL
BENZODIAZEPINE SCREEN, URINE: NEGATIVE ng/mL
Buprenorphine, Urine: NEGATIVE ng/mL
CANNABINOIDS UR QL SCN: NEGATIVE ng/mL
Cocaine (Metab) Scrn, Ur: NEGATIVE ng/mL
Creatinine(Crt), U: 113.2 mg/dL (ref 20.0–300.0)
Fentanyl, Urine: NEGATIVE pg/mL
Meperidine Screen, Urine: NEGATIVE ng/mL
Methadone Screen, Urine: NEGATIVE ng/mL
OXYCODONE+OXYMORPHONE UR QL SCN: NEGATIVE ng/mL
Opiate Scrn, Ur: NEGATIVE ng/mL
Ph of Urine: 6.2 (ref 4.5–8.9)
Phencyclidine Qn, Ur: NEGATIVE ng/mL
Propoxyphene Scrn, Ur: NEGATIVE ng/mL
SPECIFIC GRAVITY: 1.016
Tramadol Screen, Urine: NEGATIVE ng/mL

## 2021-01-04 LAB — MICROSCOPIC EXAMINATION: Casts: NONE SEEN /lpf

## 2021-01-04 LAB — NICOTINE SCREEN, URINE: Cotinine Ql Scrn, Ur: NEGATIVE ng/mL

## 2021-01-05 LAB — TOXOPLASMA ANTIBODIES- IGG AND  IGM
Toxoplasma Antibody- IgM: <3 AU/mL (ref 0.0–7.9)
Toxoplasma IgG Ratio: <3 IU/mL (ref 0.0–7.1)

## 2021-01-05 LAB — VARICELLA ZOSTER ANTIBODY, IGG: Varicella zoster IgG: 261 index (ref >165)

## 2021-01-05 LAB — VIRAL HEPATITIS HBV, HCV
HCV Ab: 0.1 s/co ratio (ref 0.0–0.9)
Hep B Core Total Ab: NEGATIVE
Hep B Surface Ab, Qual: REACTIVE
Hepatitis B Surface Ag: NEGATIVE

## 2021-01-05 LAB — GC/CHLAMYDIA PROBE AMP
Chlamydia trachomatis, NAA: NEGATIVE
Neisseria Gonorrhoeae by PCR: NEGATIVE

## 2021-01-05 LAB — RPR: RPR Ser Ql: NONREACTIVE

## 2021-01-05 LAB — URINE CULTURE, OB REFLEX

## 2021-01-05 LAB — RUBELLA SCREEN: Rubella Antibodies, IGG: <0.9 index — ABNORMAL LOW (ref >0.99)

## 2021-01-05 LAB — ABO AND RH: Rh Factor: POSITIVE

## 2021-01-05 LAB — HCV INTERPRETATION

## 2021-01-05 LAB — HEMOGLOBIN A1C
Est. average glucose Bld gHb Est-mCnc: 97 mg/dL
Hgb A1c MFr Bld: 5 % (ref 4.8–5.6)

## 2021-01-05 LAB — ANTIBODY SCREEN: Antibody Screen: NEGATIVE

## 2021-01-05 LAB — TSH: TSH: 0.237 u[IU]/mL — ABNORMAL LOW (ref 0.450–4.500)

## 2021-01-05 LAB — HIV ANTIBODY (ROUTINE TESTING W REFLEX): HIV Screen 4th Generation wRfx: NONREACTIVE

## 2021-01-05 LAB — CULTURE, OB URINE

## 2021-01-25 ENCOUNTER — Other Ambulatory Visit: Payer: Self-pay | Admitting: Obstetrics and Gynecology

## 2021-01-25 DIAGNOSIS — Z3481 Encounter for supervision of other normal pregnancy, first trimester: Secondary | ICD-10-CM

## 2021-01-27 ENCOUNTER — Ambulatory Visit (INDEPENDENT_AMBULATORY_CARE_PROVIDER_SITE_OTHER): Payer: 59 | Admitting: Obstetrics and Gynecology

## 2021-01-27 ENCOUNTER — Other Ambulatory Visit: Payer: Self-pay

## 2021-01-27 ENCOUNTER — Encounter: Payer: Self-pay | Admitting: Obstetrics and Gynecology

## 2021-01-27 VITALS — BP 133/82 | HR 76 | Ht 65.0 in | Wt 235.8 lb

## 2021-01-27 DIAGNOSIS — Z8632 Personal history of gestational diabetes: Secondary | ICD-10-CM

## 2021-01-27 DIAGNOSIS — Z3A13 13 weeks gestation of pregnancy: Secondary | ICD-10-CM | POA: Diagnosis not present

## 2021-01-27 DIAGNOSIS — E669 Obesity, unspecified: Secondary | ICD-10-CM

## 2021-01-27 DIAGNOSIS — R7989 Other specified abnormal findings of blood chemistry: Secondary | ICD-10-CM

## 2021-01-27 DIAGNOSIS — Z8759 Personal history of other complications of pregnancy, childbirth and the puerperium: Secondary | ICD-10-CM

## 2021-01-27 DIAGNOSIS — O09299 Supervision of pregnancy with other poor reproductive or obstetric history, unspecified trimester: Secondary | ICD-10-CM | POA: Insufficient documentation

## 2021-01-27 DIAGNOSIS — Z8709 Personal history of other diseases of the respiratory system: Secondary | ICD-10-CM

## 2021-01-27 DIAGNOSIS — F419 Anxiety disorder, unspecified: Secondary | ICD-10-CM

## 2021-01-27 DIAGNOSIS — O99340 Other mental disorders complicating pregnancy, unspecified trimester: Secondary | ICD-10-CM

## 2021-01-27 DIAGNOSIS — Z1379 Encounter for other screening for genetic and chromosomal anomalies: Secondary | ICD-10-CM

## 2021-01-27 MED ORDER — ASPIRIN EC 81 MG PO TBEC: 81.0000 mg | DELAYED_RELEASE_TABLET | Freq: Every day | ORAL | 2 refills | Status: DC

## 2021-01-27 NOTE — Progress Notes (Signed)
NOB-PE pt stated that she as doing well. Pt is unsure about genetic testing at this time. Pap up to date completed 11/30/2020. PHQ-9=6. GAD-7=5.

## 2021-01-27 NOTE — Patient Instructions (Signed)
WHAT OB PATIENTS CAN EXPECT   Confirmation of pregnancy and ultrasound ordered if medically indicated-[redacted] weeks gestation  New OB (NOB) intake with nurse and New OB (NOB) labs- [redacted] weeks gestation  New OB (NOB) physical examination with provider- 11/[redacted] weeks gestation  Flu vaccine-[redacted] weeks gestation  Anatomy scan-[redacted] weeks gestation  Glucose tolerance test, blood work to test for anemia, T-dap vaccine-[redacted] weeks gestation  Vaginal swabs/cultures-STD/Group B strep-[redacted] weeks gestation  Appointments every 4 weeks until 28 weeks  Every 2 weeks from 28 weeks until 36 weeks  Weekly visits from 36 weeks until delivery  Second Trimester of Pregnancy  The second trimester of pregnancy is from week 13 through week 27. This is also called months 4 through 6 of pregnancy. This is often the time when you feel your best. During the second trimester:  Morning sickness is less or has stopped.  You may have more energy.  You may feel hungry more often. At this time, your unborn baby (fetus) is growing very fast. At the end of the sixth month, the unborn baby may be up to 12 inches long and weigh about 1 pounds. You will likely start to feel the baby move between 16 and 20 weeks of pregnancy. Body changes during your second trimester Your body continues to go through many changes during this time. The changes vary and generally return to normal after the baby is born. Physical changes  You will gain more weight.  You may start to get stretch marks on your hips, belly (abdomen), and breasts.  Your breasts will grow and may hurt.  Dark spots or blotches may develop on your face.  A dark line from your belly button to the pubic area (linea nigra) may appear.  You may have changes in your hair. Health changes  You may have headaches.  You may have heartburn.  You may have trouble pooping (constipation).  You may have hemorrhoids or swollen, bulging veins (varicose veins).  Your gums may  bleed.  You may pee (urinate) more often.  You may have back pain. Follow these instructions at home: Medicines  Take over-the-counter and prescription medicines only as told by your doctor. Some medicines are not safe during pregnancy.  Take a prenatal vitamin that contains at least 600 micrograms (mcg) of folic acid. Eating and drinking  Eat healthy meals that include: ? Fresh fruits and vegetables. ? Whole grains. ? Good sources of protein, such as meat, eggs, or tofu. ? Low-fat dairy products.  Avoid raw meat and unpasteurized juice, milk, and cheese.  You may need to take these actions to prevent or treat trouble pooping: ? Drink enough fluids to keep your pee (urine) pale yellow. ? Eat foods that are high in fiber. These include beans, whole grains, and fresh fruits and vegetables. ? Limit foods that are high in fat and sugar. These include fried or sweet foods. Activity  Exercise only as told by your doctor. Most people can do their usual exercise during pregnancy. Try to exercise for 30 minutes at least 5 days a week.  Stop exercising if you have pain or cramps in your belly or lower back.  Do not exercise if it is too hot or too humid, or if you are in a place of great height (high altitude).  Avoid heavy lifting.  If you choose to, you may have sex unless your doctor tells you not to. Relieving pain and discomfort  Wear a good support bra if your breasts   are sore.  Take warm water baths (sitz baths) to soothe pain or discomfort caused by hemorrhoids. Use hemorrhoid cream if your doctor approves.  Rest with your legs raised (elevated) if you have leg cramps or low back pain.  If you develop bulging veins in your legs: ? Wear support hose as told by your doctor. ? Raise your feet for 15 minutes, 3-4 times a day. ? Limit salt in your food. Safety  Wear your seat belt at all times when you are in a car.  Talk with your doctor if someone is hurting you or  yelling at you a lot. Lifestyle  Do not use hot tubs, steam rooms, or saunas.  Do not douche. Do not use tampons or scented sanitary pads.  Avoid cat litter boxes and soil used by cats. These carry germs that can harm your baby and can cause a loss of your baby by miscarriage or stillbirth.  Do not use herbal medicines, illegal drugs, or medicines that are not approved by your doctor. Do not drink alcohol.  Do not smoke or use any products that contain nicotine or tobacco. If you need help quitting, ask your doctor. General instructions  Keep all follow-up visits. This is important.  Ask your doctor about local prenatal classes.  Ask your doctor about the right foods to eat or for help finding a counselor. Where to find more information  American Pregnancy Association: americanpregnancy.org  American College of Obstetricians and Gynecologists: www.acog.org  Office on Women's Health: womenshealth.gov/pregnancy Contact a doctor if:  You have a headache that does not go away when you take medicine.  You have changes in how you see, or you see spots in front of your eyes.  You have mild cramps, pressure, or pain in your lower belly.  You continue to feel like you may vomit (nauseous), you vomit, or you have watery poop (diarrhea).  You have bad-smelling fluid coming from your vagina.  You have pain when you pee or your pee smells bad.  You have very bad swelling of your face, hands, ankles, feet, or legs.  You have a fever. Get help right away if:  You are leaking fluid from your vagina.  You have spotting or bleeding from your vagina.  You have very bad belly cramping or pain.  You have trouble breathing.  You have chest pain.  You faint.  You have not felt your baby move for the time period told by your doctor.  You have new or increased pain, swelling, or redness in an arm or leg. Summary  The second trimester of pregnancy is from week 13 through week 27  (months 4 through 6).  Eat healthy meals.  Exercise as told by your doctor. Most people can do their usual exercise during pregnancy.  Do not use herbal medicines, illegal drugs, or medicines that are not approved by your doctor. Do not drink alcohol.  Call your doctor if you get sick or if you notice anything unusual about your pregnancy. This information is not intended to replace advice given to you by your health care provider. Make sure you discuss any questions you have with your health care provider. Document Revised: 04/21/2020 Document Reviewed: 02/26/2020 Elsevier Patient Education  2021 [employer]. Common Medications Safe in Pregnancy  Acne:      Constipation:  Benzoyl Peroxide     Colace  Clindamycin      Dulcolax Suppository  Topica Erythromycin     Fibercon  Salicylic Acid        Metamucil         Miralax AVOID:        Senakot   Accutane    Cough:  Retin-A       Cough Drops  Tetracycline      Phenergan w/ Codeine if Rx  Minocycline      Robitussin (Plain & DM)  Antibiotics:     Crabs/Lice:  Ceclor       RID  Cephalosporins    AVOID:  E-Mycins      Kwell  Keflex  Macrobid/Macrodantin   Diarrhea:  Penicillin      Kao-Pectate  Zithromax      Imodium AD         PUSH FLUIDS AVOID:       Cipro     Fever:  Tetracycline      Tylenol (Regular or Extra  Minocycline       Strength)  Levaquin      Extra Strength-Do not          Exceed 8 tabs/24 hrs Caffeine:        <200mg/day (equiv. To 1 cup of coffee or  approx. 3 12 oz sodas)         Gas: Cold/Hayfever:       Gas-X  Benadryl      Mylicon  Claritin       Phazyme  **Claritin-D        Chlor-Trimeton    Headaches:  Dimetapp      ASA-Free Excedrin  Drixoral-Non-Drowsy     Cold Compress  Mucinex (Guaifenasin)     Tylenol (Regular or Extra  Sudafed/Sudafed-12 Hour     Strength)  **Sudafed PE Pseudoephedrine   Tylenol Cold & Sinus     Vicks Vapor Rub  Zyrtec  **AVOID if Problems With Blood  Pressure         Heartburn: Avoid lying down for at least 1 hour after meals  Aciphex      Maalox     Rash:  Milk of Magnesia     Benadryl    Mylanta       1% Hydrocortisone Cream  Pepcid  Pepcid Complete   Sleep Aids:  Prevacid      Ambien   Prilosec       Benadryl  Rolaids       Chamomile Tea  Tums (Limit 4/day)     Unisom         Tylenol PM         Warm milk-add vanilla or  Hemorrhoids:       Sugar for taste  Anusol/Anusol H.C.  (RX: Analapram 2.5%)  Sugar Substitutes:  Hydrocortisone OTC     Ok in moderation  Preparation H      Tucks        Vaseline lotion applied to tissue with wiping    Herpes:     Throat:  Acyclovir      Oragel  Famvir  Valtrex     Vaccines:         Flu Shot Leg Cramps:       *Gardasil  Benadryl      Hepatitis A         Hepatitis B Nasal Spray:       Pneumovax  Saline Nasal Spray     Polio Booster         Tetanus Nausea:       Tuberculosis test or PPD  Vitamin B6 25 mg TID   AVOID:      Dramamine      *Gardasil  Emetrol       Live Poliovirus  Ginger Root 250 mg QID    MMR (measles, mumps &  High Complex Carbs @ Bedtime    rebella)  Sea Bands-Accupressure    Varicella (Chickenpox)  Unisom 1/2 tab TID     *No known complications           If received before Pain:         Known pregnancy;   Darvocet       Resume series after  Lortab        Delivery  Percocet    Yeast:   Tramadol      Femstat  Tylenol 3      Gyne-lotrimin  Ultram       Monistat  Vicodin           MISC:         All Sunscreens           Hair Coloring/highlights          Insect Repellant's          (Including DEET)         [facility] T[facility]

## 2021-01-27 NOTE — Progress Notes (Addendum)
OBSTETRIC INITIAL PRENATAL VISIT  Subjective:    Jennifer Best is being seen today for her first obstetrical visit.  This is not a planned pregnancy. She is a 30 y.o. [id number] female at [redacted]w[redacted]d gestation, Estimated Date of Delivery: 08/04/21 with No LMP recorded (lmp unknown), consistent with 6 week sono. Her obstetrical history is significant for history of gestational diabetes (diet-controlled), asthma (last exacerbation ~ 3 years ago), and obesity. Relationship with FOB: spouse, living together. Patient does intend to breast feed. Pregnancy history fully reviewed.  Reports doing well on Zoloft. Feels that her anxiety is more manageable now.  Stable on current dose (initiated at NOB intake visit).    OB History  Gravida Para Term Preterm AB Living  6 3 3  0 2 2  SAB IAB Ectopic Multiple Live Births  1 0 1 0 2    # Outcome Date GA Lbr Len/2nd Weight Sex Delivery Anes PTL Lv  6 Current           5 SAB 2020          4 Term 07/02/17 [redacted]w[redacted]d  6 lb 12 oz (3.062 kg) M Vag-Spont None  LIV     Name: Krzywicki,BOY Correna     Apgar1: 8  Apgar5: 9  3 Ectopic 2017          2 Term 2015 [redacted]w[redacted]d  7 lb 1.6 oz (3.221 kg) F Vag-Spont   LIV  1 Term 2012 [redacted]w[redacted]d  6 lb 2.2 oz (2.785 kg) F Vag-Spont   LIV    Gynecologic History:  Last pap smear was 11/30/2020.  Results were normal.  Denies h/o abnormal pap smears in the past.  Denies  history of STIs.  Contraception: None   Past Medical History:  Diagnosis Date  . Asthma   . Obesity (BMI 30-39.9)     Family History  Problem Relation Age of Onset  . Lung cancer Mother   . Breast cancer Maternal Grandmother   . Lung cancer Paternal Grandmother   . Prostate cancer Paternal Grandfather   . Diabetes Paternal Grandfather   . Diabetes Maternal Grandfather   . Ovarian cancer Neg Hx   . Colon cancer Neg Hx     Past Surgical History:  Procedure Laterality Date  . CHOLECYSTECTOMY    . LAPAROSCOPY Left 07/17/2016   Procedure: LAPAROSCOPY DIAGNOSTIC;  Surgeon:  07/19/2016, MD;  Location: [facility] ORS;  Service: Gynecology;  Laterality: Left;  removal ectopic pregnancy  . UNILATERAL SALPINGECTOMY Left 07/17/2016   Procedure: UNILATERAL SALPINGECTOMY;  Surgeon: 07/19/2016, MD;  Location: [facility] ORS;  Service: Gynecology;  Laterality: Left;    Social History   Socioeconomic History  . Marital status: Married    Spouse name: Not on file  . Number of children: Not on file  . Years of education: Not on file  . Highest education level: Not on file  Occupational History  . Not on file  Tobacco Use  . Smoking status: Former Hildred Laser  . Smokeless tobacco: Former Games developer  . Vaping Use: Never used  Substance and Sexual Activity  . Alcohol use: Not Currently    Comment: occass  . Drug use: No  . Sexual activity: Yes    Partners: Male, Female    Comment: husband  Other Topics Concern  . Not on file  Social History Narrative  . Not on file   Social Determinants of Health   Financial Resource Strain: Not on file  Food Insecurity:  Not on file  Transportation Needs: Not on file  Physical Activity: Not on file  Stress: Not on file  Social Connections: Not on file  Intimate Partner Violence: Not on file    Current Outpatient Medications on File Prior to Visit  Medication Sig Dispense Refill  . Prenatal MV & Min w/FA-DHA (PRENATAL GUMMIES PO) Take by mouth.    . sertraline (ZOLOFT) 50 MG tablet Take 1 tablet (50 mg total) by mouth daily. 1/2 tab for 7 days then whole tab 30 tablet 1   No current facility-administered medications on file prior to visit.    Allergies  Allergen Reactions  . Morphine And Related Other (See Comments)    Makes "veins stand up"  . Latex Rash and Other (See Comments)    Watery eyes     Review of Systems General: Not Present- Fever, Weight Loss and Weight Gain. Skin: Not Present- Rash. HEENT: Not Present- Blurred Vision, Headache and Bleeding Gums. Respiratory: Not Present- Difficulty  Breathing. Breast: Not Present- Breast Mass. Cardiovascular: Not Present- Chest Pain, Elevated Blood Pressure, Fainting / Blacking Out and Shortness of Breath. Gastrointestinal: Not Present- Abdominal Pain, Constipation, Nausea and Vomiting. Female Genitourinary: Not Present- Frequency, Painful Urination, Pelvic Pain, Vaginal Bleeding, Vaginal Discharge, Contractions, regular, Fetal Movements Decreased, Urinary Complaints and Vaginal Fluid. Musculoskeletal: Not Present- Back Pain and Leg Cramps. Neurological: Not Present- Dizziness. Psychiatric: Not Present- Depression.     Objective:   Blood pressure 133/82, pulse 76, height 5\' 5"  (1.651 m), weight 235 lb 12.8 oz (107 kg).  Body mass index is 39.24 kg/m.  General Appearance:    Alert, cooperative, no distress, appears stated age, moderate obesity  Head:    Normocephalic, without obvious abnormality, atraumatic  Eyes:    PERRL, conjunctiva/corneas clear, EOM's intact, both eyes  Ears:    Normal external ear canals, both ears  Nose:   Nares normal, septum midline, mucosa normal, no drainage or sinus tenderness  Throat:   Lips, mucosa, and tongue normal; teeth and gums normal  Neck:   Supple, symmetrical, trachea midline, no adenopathy; thyroid: no enlargement/tenderness/nodules; no carotid bruit or JVD  Back:     Symmetric, no curvature, ROM normal, no CVA tenderness  Lungs:     Clear to auscultation bilaterally, respirations unlabored  Chest Wall:    No tenderness or deformity   Heart:    Regular rate and rhythm, S1 and S2 normal, no murmur, rub or gallop  Breast Exam:    No tenderness, masses, or nipple abnormality  Abdomen:     Soft, non-tender, bowel sounds active all four quadrants, no masses, no organomegaly.  FH 13.  FHT 148 bpm.  Genitalia:    Pelvic:external genitalia normal, vagina without lesions, discharge, or tenderness, rectovaginal septum  normal. Cervix normal in appearance, no cervical motion tenderness, no adnexal masses  or tenderness.  Pregnancy positive findings: uterine enlargement: 13 wk size, nontender.   Rectal:    Normal external sphincter.  No hemorrhoids appreciated. Internal exam not done.   Extremities:   Extremities normal, atraumatic, no cyanosis or edema  Pulses:   2+ and symmetric all extremities  Skin:   Skin color, texture, turgor normal, no rashes or lesions  Lymph nodes:   Cervical, supraclavicular, and axillary nodes normal  Neurologic:   CNII-XII intact, normal strength, sensation and reflexes throughout      Assessment:   1. [redacted] weeks gestation of pregnancy   2. Anxiety in pregnancy, antepartum   3. Abnormal TSH  4. Genetic screening   5. Obesity (BMI 35.0-39.9 without comorbidity)   6. History of asthma   7. History of gestational diabetes   8. History of ectopic pregnancy     Plan:   1. Supervision of normal pregnancy  - Initial labs reviewed. Rubella non-immune.  For vaccination postpartum.  - Prenatal vitamins encouraged. - Problem list reviewed and updated. - New OB counseling:  The patient has been given an overview regarding routine prenatal care.   - Prenatal testing, optional genetic testing, and ultrasound use in pregnancy were reviewed.   Cell-free DNA testing discussed: [id number] ordered. For AFP next visit if desired. - Benefits of Breast Feeding were discussed. The patient is encouraged to consider nursing her baby post partum.  2. Abnormal TSH - Patient with abnormal TSH on NOB labs, will repeat thyroid panel.   3. Anxiety in pregnancy - Currently stable on Zoloft 5 mg  4. Obesity in pregnancy - Recommendations regarding diet, weight gain, and exercise in pregnancy were given. - Patient will need antenatal testing beginning at 36 weeks if BMI > 40, also would qualify for IOL in 39th week.  - To begin daily baby aspirin due to risk factors (obesity, h/o GDM)  5. History of GDM - Recent A1 normal, 5.0. To rescreen at [redacted] weeks gestation.   6. History of  ectopic pregnancy - Prior salpingectomy.  Current pregnancy normal IUP.   The patient has Medicaid.  [facility] Medicaid Risk Screening Form completed today  Follow up in 4 weeks.  50% of 30 min visit spent on counseling and coordination of care.   Hildred Laser, MD [facility]

## 2021-01-28 LAB — THYROID PANEL WITH TSH
Free Thyroxine Index: 2 (ref 1.2–4.9)
T3 Uptake Ratio: 15 % — ABNORMAL LOW (ref 24–39)
T4, Total: 13.1 ug/dL — ABNORMAL HIGH (ref 4.5–12.0)
TSH: 0.255 u[IU]/mL — ABNORMAL LOW (ref 0.450–4.500)

## 2021-01-29 ENCOUNTER — Encounter: Payer: Self-pay | Admitting: Obstetrics and Gynecology

## 2021-02-01 LAB — MATERNIT21  PLUS CORE+ESS+SCA, BLOOD

## 2021-02-22 ENCOUNTER — Other Ambulatory Visit: Payer: Self-pay | Admitting: Obstetrics and Gynecology

## 2021-02-22 DIAGNOSIS — Z3481 Encounter for supervision of other normal pregnancy, first trimester: Secondary | ICD-10-CM

## 2021-02-23 ENCOUNTER — Ambulatory Visit (INDEPENDENT_AMBULATORY_CARE_PROVIDER_SITE_OTHER): Payer: 59 | Admitting: Obstetrics and Gynecology

## 2021-02-23 ENCOUNTER — Encounter: Payer: Self-pay | Admitting: Obstetrics and Gynecology

## 2021-02-23 ENCOUNTER — Other Ambulatory Visit: Payer: Self-pay

## 2021-02-23 VITALS — BP 113/85 | HR 111 | Wt 236.5 lb

## 2021-02-23 DIAGNOSIS — Z3A16 16 weeks gestation of pregnancy: Secondary | ICD-10-CM

## 2021-02-23 DIAGNOSIS — F419 Anxiety disorder, unspecified: Secondary | ICD-10-CM

## 2021-02-23 DIAGNOSIS — O99342 Other mental disorders complicating pregnancy, second trimester: Secondary | ICD-10-CM

## 2021-02-23 DIAGNOSIS — Z1379 Encounter for other screening for genetic and chromosomal anomalies: Secondary | ICD-10-CM

## 2021-02-23 DIAGNOSIS — Z3482 Encounter for supervision of other normal pregnancy, second trimester: Secondary | ICD-10-CM

## 2021-02-23 DIAGNOSIS — E0590 Thyrotoxicosis, unspecified without thyrotoxic crisis or storm: Secondary | ICD-10-CM | POA: Insufficient documentation

## 2021-02-23 LAB — POCT URINALYSIS DIPSTICK OB
Bilirubin, UA: NEGATIVE
Blood, UA: NEGATIVE
Glucose, UA: NEGATIVE
Ketones, UA: NEGATIVE
Leukocytes, UA: NEGATIVE
Nitrite, UA: NEGATIVE
POC,PROTEIN,UA: NEGATIVE
Spec Grav, UA: 1.02 (ref 1.010–1.025)
Urobilinogen, UA: 0.2 E.U./dL
pH, UA: 6.5 (ref 5.0–8.0)

## 2021-02-23 NOTE — Progress Notes (Signed)
ROB: Notes an increase in vaginal discharge, is clear, no itching or burning. Notes not starting aspirin as recommended as pharmacy did not fill it. Advised to get OTC. Reviewed labs, has subclinical hyperthyroidism, will need thyrod panel each trimester. Reports that her medication bottle advised on asking about use of her Zoloft in 3rd trimester. Answered questions regarding Zoloft use in pregnancy and at delivery, including small increase risk of growth restriction, and difficulties with neonatal transition at birth.  Notes that she is considering waterbirth. Will refer to midwifery service for consultation.  AFP ordered today. RTC in 4 weeks, for anatomy scan then.

## 2021-02-23 NOTE — Progress Notes (Signed)
OB-Pt present for routine prenatal care. Pt stated that she was doing well and stated having an increase in vaginal discharge that was clear no odor. Pt also stated that she is currently not taking Aspirin 81 mg bc the pharmacy did not complete the rx. Pt was advised that she could get the medication OTC.

## 2021-02-23 NOTE — Patient Instructions (Signed)
WHAT OB PATIENTS CAN EXPECT   Confirmation of pregnancy and ultrasound ordered if medically indicated-[redacted] weeks gestation  New OB (NOB) intake with nurse and New OB (NOB) labs- [redacted] weeks gestation  New OB (NOB) physical examination with provider- 11/[redacted] weeks gestation  Flu vaccine-[redacted] weeks gestation  Anatomy scan-[redacted] weeks gestation  Glucose tolerance test, blood work to test for anemia, T-dap vaccine-[redacted] weeks gestation  Vaginal swabs/cultures-STD/Group B strep-[redacted] weeks gestation  Appointments every 4 weeks until 28 weeks  Every 2 weeks from 28 weeks until 36 weeks  Weekly visits from 36 weeks until delivery  Second Trimester of Pregnancy  The second trimester of pregnancy is from week 13 through week 27. This is also called months 4 through 6 of pregnancy. This is often the time when you feel your best. During the second trimester:  Morning sickness is less or has stopped.  You may have more energy.  You may feel hungry more often. At this time, your unborn baby (fetus) is growing very fast. At the end of the sixth month, the unborn baby may be up to 12 inches long and weigh about 1 pounds. You will likely start to feel the baby move between 16 and 20 weeks of pregnancy. Body changes during your second trimester Your body continues to go through many changes during this time. The changes vary and generally return to normal after the baby is born. Physical changes  You will gain more weight.  You may start to get stretch marks on your hips, belly (abdomen), and breasts.  Your breasts will grow and may hurt.  Dark spots or blotches may develop on your face.  A dark line from your belly button to the pubic area (linea nigra) may appear.  You may have changes in your hair. Health changes  You may have headaches.  You may have heartburn.  You may have trouble pooping (constipation).  You may have hemorrhoids or swollen, bulging veins (varicose veins).  Your gums may  bleed.  You may pee (urinate) more often.  You may have back pain. Follow these instructions at home: Medicines  Take over-the-counter and prescription medicines only as told by your doctor. Some medicines are not safe during pregnancy.  Take a prenatal vitamin that contains at least 600 micrograms (mcg) of folic acid. Eating and drinking  Eat healthy meals that include: ? Fresh fruits and vegetables. ? Whole grains. ? Good sources of protein, such as meat, eggs, or tofu. ? Low-fat dairy products.  Avoid raw meat and unpasteurized juice, milk, and cheese.  You may need to take these actions to prevent or treat trouble pooping: ? Drink enough fluids to keep your pee (urine) pale yellow. ? Eat foods that are high in fiber. These include beans, whole grains, and fresh fruits and vegetables. ? Limit foods that are high in fat and sugar. These include fried or sweet foods. Activity  Exercise only as told by your doctor. Most people can do their usual exercise during pregnancy. Try to exercise for 30 minutes at least 5 days a week.  Stop exercising if you have pain or cramps in your belly or lower back.  Do not exercise if it is too hot or too humid, or if you are in a place of great height (high altitude).  Avoid heavy lifting.  If you choose to, you may have sex unless your doctor tells you not to. Relieving pain and discomfort  Wear a good support bra if your breasts   are sore.  Take warm water baths (sitz baths) to soothe pain or discomfort caused by hemorrhoids. Use hemorrhoid cream if your doctor approves.  Rest with your legs raised (elevated) if you have leg cramps or low back pain.  If you develop bulging veins in your legs: ? Wear support hose as told by your doctor. ? Raise your feet for 15 minutes, 3-4 times a day. ? Limit salt in your food. Safety  Wear your seat belt at all times when you are in a car.  Talk with your doctor if someone is hurting you or  yelling at you a lot. Lifestyle  Do not use hot tubs, steam rooms, or saunas.  Do not douche. Do not use tampons or scented sanitary pads.  Avoid cat litter boxes and soil used by cats. These carry germs that can harm your baby and can cause a loss of your baby by miscarriage or stillbirth.  Do not use herbal medicines, illegal drugs, or medicines that are not approved by your doctor. Do not drink alcohol.  Do not smoke or use any products that contain nicotine or tobacco. If you need help quitting, ask your doctor. General instructions  Keep all follow-up visits. This is important.  Ask your doctor about local prenatal classes.  Ask your doctor about the right foods to eat or for help finding a counselor. Where to find more information  American Pregnancy Association: americanpregnancy.org  American College of Obstetricians and Gynecologists: www.acog.org  Office on Women's Health: womenshealth.gov/pregnancy Contact a doctor if:  You have a headache that does not go away when you take medicine.  You have changes in how you see, or you see spots in front of your eyes.  You have mild cramps, pressure, or pain in your lower belly.  You continue to feel like you may vomit (nauseous), you vomit, or you have watery poop (diarrhea).  You have bad-smelling fluid coming from your vagina.  You have pain when you pee or your pee smells bad.  You have very bad swelling of your face, hands, ankles, feet, or legs.  You have a fever. Get help right away if:  You are leaking fluid from your vagina.  You have spotting or bleeding from your vagina.  You have very bad belly cramping or pain.  You have trouble breathing.  You have chest pain.  You faint.  You have not felt your baby move for the time period told by your doctor.  You have new or increased pain, swelling, or redness in an arm or leg. Summary  The second trimester of pregnancy is from week 13 through week 27  (months 4 through 6).  Eat healthy meals.  Exercise as told by your doctor. Most people can do their usual exercise during pregnancy.  Do not use herbal medicines, illegal drugs, or medicines that are not approved by your doctor. Do not drink alcohol.  Call your doctor if you get sick or if you notice anything unusual about your pregnancy. This information is not intended to replace advice given to you by your health care provider. Make sure you discuss any questions you have with your health care provider. Document Revised: 04/21/2020 Document Reviewed: 02/26/2020 Elsevier Patient Education  2021 [employer]. Common Medications Safe in Pregnancy  Acne:      Constipation:  Benzoyl Peroxide     Colace  Clindamycin      Dulcolax Suppository  Topica Erythromycin     Fibercon  Salicylic Acid        Metamucil         Miralax AVOID:        Senakot   Accutane    Cough:  Retin-A       Cough Drops  Tetracycline      Phenergan w/ Codeine if Rx  Minocycline      Robitussin (Plain & DM)  Antibiotics:     Crabs/Lice:  Ceclor       RID  Cephalosporins    AVOID:  E-Mycins      Kwell  Keflex  Macrobid/Macrodantin   Diarrhea:  Penicillin      Kao-Pectate  Zithromax      Imodium AD         PUSH FLUIDS AVOID:       Cipro     Fever:  Tetracycline      Tylenol (Regular or Extra  Minocycline       Strength)  Levaquin      Extra Strength-Do not          Exceed 8 tabs/24 hrs Caffeine:        <200mg/day (equiv. To 1 cup of coffee or  approx. 3 12 oz sodas)         Gas: Cold/Hayfever:       Gas-X  Benadryl      Mylicon  Claritin       Phazyme  **Claritin-D        Chlor-Trimeton    Headaches:  Dimetapp      ASA-Free Excedrin  Drixoral-Non-Drowsy     Cold Compress  Mucinex (Guaifenasin)     Tylenol (Regular or Extra  Sudafed/Sudafed-12 Hour     Strength)  **Sudafed PE Pseudoephedrine   Tylenol Cold & Sinus     Vicks Vapor Rub  Zyrtec  **AVOID if Problems With Blood  Pressure         Heartburn: Avoid lying down for at least 1 hour after meals  Aciphex      Maalox     Rash:  Milk of Magnesia     Benadryl    Mylanta       1% Hydrocortisone Cream  Pepcid  Pepcid Complete   Sleep Aids:  Prevacid      Ambien   Prilosec       Benadryl  Rolaids       Chamomile Tea  Tums (Limit 4/day)     Unisom         Tylenol PM         Warm milk-add vanilla or  Hemorrhoids:       Sugar for taste  Anusol/Anusol H.C.  (RX: Analapram 2.5%)  Sugar Substitutes:  Hydrocortisone OTC     Ok in moderation  Preparation H      Tucks        Vaseline lotion applied to tissue with wiping    Herpes:     Throat:  Acyclovir      Oragel  Famvir  Valtrex     Vaccines:         Flu Shot Leg Cramps:       *Gardasil  Benadryl      Hepatitis A         Hepatitis B Nasal Spray:       Pneumovax  Saline Nasal Spray     Polio Booster         Tetanus Nausea:       Tuberculosis test or PPD  Vitamin B6 25 mg TID   AVOID:      Dramamine      *Gardasil  Emetrol       Live Poliovirus  Ginger Root 250 mg QID    MMR (measles, mumps &  High Complex Carbs @ Bedtime    rebella)  Sea Bands-Accupressure    Varicella (Chickenpox)  Unisom 1/2 tab TID     *No known complications           If received before Pain:         Known pregnancy;   Darvocet       Resume series after  Lortab        Delivery  Percocet    Yeast:   Tramadol      Femstat  Tylenol 3      Gyne-lotrimin  Ultram       Monistat  Vicodin           MISC:         All Sunscreens           Hair Coloring/highlights          Insect Repellant's          (Including DEET)         [facility] T[facility]

## 2021-02-24 ENCOUNTER — Encounter: Payer: 59 | Admitting: Obstetrics and Gynecology

## 2021-02-25 LAB — AFP, SERUM, OPEN SPINA BIFIDA
AFP MoM: 1.37
AFP Value: 39.2 ng/mL
Gest. Age on Collection Date: 16.6 weeks
Maternal Age At EDD: 31.6 yr
OSBR Risk 1 IN: 3920
Test Results:: NEGATIVE
Weight: 237 [lb_av]

## 2021-03-24 ENCOUNTER — Other Ambulatory Visit: Payer: Self-pay

## 2021-03-24 ENCOUNTER — Encounter: Payer: Self-pay | Admitting: Certified Nurse Midwife

## 2021-03-24 ENCOUNTER — Ambulatory Visit (INDEPENDENT_AMBULATORY_CARE_PROVIDER_SITE_OTHER): Payer: Managed Care, Other (non HMO) | Admitting: Certified Nurse Midwife

## 2021-03-24 VITALS — BP 117/77 | HR 116 | Wt 236.6 lb

## 2021-03-24 DIAGNOSIS — O99342 Other mental disorders complicating pregnancy, second trimester: Secondary | ICD-10-CM

## 2021-03-24 DIAGNOSIS — E669 Obesity, unspecified: Secondary | ICD-10-CM

## 2021-03-24 DIAGNOSIS — Z3482 Encounter for supervision of other normal pregnancy, second trimester: Secondary | ICD-10-CM

## 2021-03-24 DIAGNOSIS — Z3A21 21 weeks gestation of pregnancy: Secondary | ICD-10-CM

## 2021-03-24 DIAGNOSIS — F419 Anxiety disorder, unspecified: Secondary | ICD-10-CM

## 2021-03-24 LAB — POCT URINALYSIS DIPSTICK OB
Bilirubin, UA: NEGATIVE
Blood, UA: NEGATIVE
Glucose, UA: NEGATIVE
Ketones, UA: NEGATIVE
Leukocytes, UA: NEGATIVE
Nitrite, UA: NEGATIVE
POC,PROTEIN,UA: NEGATIVE
Spec Grav, UA: 1.025 (ref 1.010–1.025)
Urobilinogen, UA: 0.2 E.U./dL
pH, UA: 6 (ref 5.0–8.0)

## 2021-03-24 NOTE — Progress Notes (Signed)
ROB-Patient presents for MD care for waterbirth consultation. Reports lower abdominal discomfort and constipation. Discussed home treatment measures including abdominal support. Water birth handouts provided. Waterbirth class schedule sent via MyChart. Anticipatory guidance regarding course of prenatal care. Reviewed red flag symptoms and when to call. RTC x 4 weeks for ROB with Dr. Valentino Saxon or sooner if needed.   Juliann Pares, Student-MidWife [employer] 03/24/21 12:04 PM

## 2021-03-24 NOTE — Patient Instructions (Addendum)
Second Trimester of Pregnancy  The second trimester of pregnancy is from week 13 through week 27. This is also called months 4 through 6 of pregnancy. This is often the time when you feel your best. During the second trimester:  Morning sickness is less or has stopped.  You may have more energy.  You may feel hungry more often. At this time, your unborn baby (fetus) is growing very fast. At the end of the sixth month, the unborn baby may be up to 12 inches long and weigh about 1 pounds. You will likely start to feel the baby move between 16 and 20 weeks of pregnancy. Body changes during your second trimester Your body continues to go through many changes during this time. The changes vary and generally return to normal after the baby is born. Physical changes  You will gain more weight.  You may start to get stretch marks on your hips, belly (abdomen), and breasts.  Your breasts will grow and may hurt.  Dark spots or blotches may develop on your face.  A dark line from your belly button to the pubic area (linea nigra) may appear.  You may have changes in your hair. Health changes  You may have headaches.  You may have heartburn.  You may have trouble pooping (constipation).  You may have hemorrhoids or swollen, bulging veins (varicose veins).  Your gums may bleed.  You may pee (urinate) more often.  You may have back pain. Follow these instructions at home: Medicines  Take over-the-counter and prescription medicines only as told by your doctor. Some medicines are not safe during pregnancy.  Take a prenatal vitamin that contains at least 600 micrograms (mcg) of folic acid. Eating and drinking  Eat healthy meals that include: ? Fresh fruits and vegetables. ? Whole grains. ? Good sources of protein, such as meat, eggs, or tofu. ? Low-fat dairy products.  Avoid raw meat and unpasteurized juice, milk, and cheese.  You may need to take these actions to prevent or  treat trouble pooping: ? Drink enough fluids to keep your pee (urine) pale yellow. ? Eat foods that are high in fiber. These include beans, whole grains, and fresh fruits and vegetables. ? Limit foods that are high in fat and sugar. These include fried or sweet foods. Activity  Exercise only as told by your doctor. Most people can do their usual exercise during pregnancy. Try to exercise for 30 minutes at least 5 days a week.  Stop exercising if you have pain or cramps in your belly or lower back.  Do not exercise if it is too hot or too humid, or if you are in a place of great height (high altitude).  Avoid heavy lifting.  If you choose to, you may have sex unless your doctor tells you not to. Relieving pain and discomfort  Wear a good support bra if your breasts are sore.  Take warm water baths (sitz baths) to soothe pain or discomfort caused by hemorrhoids. Use hemorrhoid cream if your doctor approves.  Rest with your legs raised (elevated) if you have leg cramps or low back pain.  If you develop bulging veins in your legs: ? Wear support hose as told by your doctor. ? Raise your feet for 15 minutes, 3-4 times a day. ? Limit salt in your food. Safety  Wear your seat belt at all times when you are in a car.  Talk with your doctor if someone is hurting you or yelling  at you a lot. Lifestyle  Do not use hot tubs, steam rooms, or saunas.  Do not douche. Do not use tampons or scented sanitary pads.  Avoid cat litter boxes and soil used by cats. These carry germs that can harm your baby and can cause a loss of your baby by miscarriage or stillbirth.  Do not use herbal medicines, illegal drugs, or medicines that are not approved by your doctor. Do not drink alcohol.  Do not smoke or use any products that contain nicotine or tobacco. If you need help quitting, ask your doctor. General instructions  Keep all follow-up visits. This is important.  Ask your doctor about local  prenatal classes.  Ask your doctor about the right foods to eat or for help finding a counselor. Where to find more information  American Pregnancy Association: americanpregnancy.org  American College of Obstetricians and Gynecologists: www.acog.org  Office on Women's Health: womenshealth.gov/pregnancy Contact a doctor if:  You have a headache that does not go away when you take medicine.  You have changes in how you see, or you see spots in front of your eyes.  You have mild cramps, pressure, or pain in your lower belly.  You continue to feel like you may vomit (nauseous), you vomit, or you have watery poop (diarrhea).  You have bad-smelling fluid coming from your vagina.  You have pain when you pee or your pee smells bad.  You have very bad swelling of your face, hands, ankles, feet, or legs.  You have a fever. Get help right away if:  You are leaking fluid from your vagina.  You have spotting or bleeding from your vagina.  You have very bad belly cramping or pain.  You have trouble breathing.  You have chest pain.  You faint.  You have not felt your baby move for the time period told by your doctor.  You have new or increased pain, swelling, or redness in an arm or leg. Summary  The second trimester of pregnancy is from week 13 through week 27 (months 4 through 6).  Eat healthy meals.  Exercise as told by your doctor. Most people can do their usual exercise during pregnancy.  Do not use herbal medicines, illegal drugs, or medicines that are not approved by your doctor. Do not drink alcohol.  Call your doctor if you get sick or if you notice anything unusual about your pregnancy. This information is not intended to replace advice given to you by your health care provider. Make sure you discuss any questions you have with your health care provider. Document Revised: 04/21/2020 Document Reviewed: 02/26/2020 Elsevier Patient Education  2021 Elsevier  Inc. Common Medications Safe in Pregnancy  Acne:      Constipation:  Benzoyl Peroxide     Colace  Clindamycin      Dulcolax Suppository  Topica Erythromycin     Fibercon  Salicylic Acid      Metamucil         Miralax AVOID:        Senakot   Accutane    Cough:  Retin-A       Cough Drops  Tetracycline      Phenergan w/ Codeine if Rx  Minocycline      Robitussin (Plain & DM)  Antibiotics:     Crabs/Lice:  Ceclor       RID  Cephalosporins    AVOID:  E-Mycins      Kwell  Keflex  Macrobid/Macrodantin   Diarrhea:    Penicillin      Kao-Pectate  Zithromax      Imodium AD         PUSH FLUIDS AVOID:       Cipro     Fever:  Tetracycline      Tylenol (Regular or Extra  Minocycline       Strength)  Levaquin      Extra Strength-Do not          Exceed 8 tabs/24 hrs Caffeine:        <200mg/day (equiv. To 1 cup of coffee or  approx. 3 12 oz sodas)         Gas: Cold/Hayfever:       Gas-X  Benadryl      Mylicon  Claritin       Phazyme  **Claritin-D        Chlor-Trimeton    Headaches:  Dimetapp      ASA-Free Excedrin  Drixoral-Non-Drowsy     Cold Compress  Mucinex (Guaifenasin)     Tylenol (Regular or Extra  Sudafed/Sudafed-12 Hour     Strength)  **Sudafed PE Pseudoephedrine   Tylenol Cold & Sinus     Vicks Vapor Rub  Zyrtec  **AVOID if Problems With Blood Pressure         Heartburn: Avoid lying down for at least 1 hour after meals  Aciphex      Maalox     Rash:  Milk of Magnesia     Benadryl    Mylanta       1% Hydrocortisone Cream  Pepcid  Pepcid Complete   Sleep Aids:  Prevacid      Ambien   Prilosec       Benadryl  Rolaids       Chamomile Tea  Tums (Limit 4/day)     Unisom         Tylenol PM         Warm milk-add vanilla or  Hemorrhoids:       Sugar for taste  Anusol/Anusol H.C.  (RX: Analapram 2.5%)  Sugar Substitutes:  Hydrocortisone OTC     Ok in moderation  Preparation H      Tucks        Vaseline lotion applied to tissue with  wiping    Herpes:     Throat:  Acyclovir      Oragel  Famvir  Valtrex     Vaccines:         Flu Shot Leg Cramps:       *Gardasil  Benadryl      Hepatitis A         Hepatitis B Nasal Spray:       Pneumovax  Saline Nasal Spray     Polio Booster         Tetanus Nausea:       Tuberculosis test or PPD  Vitamin B6 25 mg TID   AVOID:    Dramamine      *Gardasil  Emetrol       Live Poliovirus  Ginger Root 250 mg QID    MMR (measles, mumps &  High Complex Carbs @ Bedtime    rebella)  Sea Bands-Accupressure    Varicella (Chickenpox)  Unisom 1/2 tab TID     *No known complications           If received before Pain:         Known pregnancy;   Darvocet       Resume   Resume series after  Lortab        Delivery  Percocet    Yeast:   Tramadol      Femstat  Tylenol 3      Gyne-lotrimin  Ultram       Monistat  Vicodin           MISC:         All Sunscreens           Hair Coloring/highlights          Insect Repellant's          (Including DEET)         Mystic Tans

## 2021-03-24 NOTE — Progress Notes (Signed)
I have seen, interviewed, and examined the patient in conjunction with the [employer] and affirm the diagnosis and management plan.   Gunnar Bulla, CNM [facility], [facility] 03/24/21 2:59 PM

## 2021-03-24 NOTE — Progress Notes (Signed)
OB-Pt present for routine prenatal care and to discuss having a water birth.

## 2021-04-04 ENCOUNTER — Other Ambulatory Visit: Payer: Self-pay

## 2021-04-04 ENCOUNTER — Ambulatory Visit
Admission: RE | Admit: 2021-04-04 | Discharge: 2021-04-04 | Disposition: A | Discharge status: Home / Self Care | Payer: Managed Care, Other (non HMO) | Source: Ambulatory Visit | Attending: Obstetrics and Gynecology | Admitting: Obstetrics and Gynecology

## 2021-04-04 DIAGNOSIS — Z3A22 22 weeks gestation of pregnancy: Secondary | ICD-10-CM | POA: Insufficient documentation

## 2021-04-04 DIAGNOSIS — O0942 Supervision of pregnancy with grand multiparity, second trimester: Secondary | ICD-10-CM | POA: Diagnosis present

## 2021-04-04 DIAGNOSIS — Z3A16 16 weeks gestation of pregnancy: Secondary | ICD-10-CM

## 2021-04-05 ENCOUNTER — Other Ambulatory Visit: Payer: Self-pay | Admitting: Obstetrics and Gynecology

## 2021-04-05 DIAGNOSIS — Z362 Encounter for other antenatal screening follow-up: Secondary | ICD-10-CM

## 2021-04-22 ENCOUNTER — Encounter: Payer: Self-pay | Admitting: Obstetrics and Gynecology

## 2021-04-22 ENCOUNTER — Other Ambulatory Visit: Payer: Self-pay

## 2021-04-22 ENCOUNTER — Ambulatory Visit (INDEPENDENT_AMBULATORY_CARE_PROVIDER_SITE_OTHER): Payer: Managed Care, Other (non HMO) | Admitting: Obstetrics and Gynecology

## 2021-04-22 VITALS — BP 124/81 | HR 78 | Wt 246.5 lb

## 2021-04-22 DIAGNOSIS — O99612 Diseases of the digestive system complicating pregnancy, second trimester: Secondary | ICD-10-CM

## 2021-04-22 DIAGNOSIS — K5900 Constipation, unspecified: Secondary | ICD-10-CM

## 2021-04-22 DIAGNOSIS — Z3482 Encounter for supervision of other normal pregnancy, second trimester: Secondary | ICD-10-CM | POA: Diagnosis not present

## 2021-04-22 DIAGNOSIS — Z3A25 25 weeks gestation of pregnancy: Secondary | ICD-10-CM

## 2021-04-22 LAB — POCT URINALYSIS DIPSTICK OB
Bilirubin, UA: NEGATIVE
Blood, UA: NEGATIVE
Glucose, UA: NEGATIVE
Ketones, UA: NEGATIVE
Nitrite, UA: NEGATIVE
POC,PROTEIN,UA: NEGATIVE
Spec Grav, UA: 1.01 (ref 1.010–1.025)
Urobilinogen, UA: 0.2 E.U./dL
pH, UA: 8 (ref 5.0–8.0)

## 2021-04-22 NOTE — Progress Notes (Signed)
OB-Pt present for routine prenatal care. Pt stated that she has been constipated and having issues going to the bathroom to

## 2021-04-22 NOTE — Progress Notes (Signed)
ROB: Notes constipation, discussed OTC and home remedies.  Drinks plenty of water and takes fiber supplements. Discussed breastfeeding. RTC in 4 weeks, will need 28 week labs.

## 2021-05-04 ENCOUNTER — Ambulatory Visit: Payer: Managed Care, Other (non HMO) | Admitting: *Deleted

## 2021-05-04 ENCOUNTER — Ambulatory Visit: Payer: Managed Care, Other (non HMO) | Attending: Obstetrics and Gynecology

## 2021-05-04 ENCOUNTER — Encounter: Payer: Self-pay | Admitting: *Deleted

## 2021-05-04 ENCOUNTER — Other Ambulatory Visit: Payer: Self-pay

## 2021-05-04 ENCOUNTER — Ambulatory Visit: Admission: RE | Admit: 2021-05-04 | Payer: Managed Care, Other (non HMO) | Source: Ambulatory Visit

## 2021-05-04 VITALS — BP 131/79 | HR 90

## 2021-05-04 DIAGNOSIS — O99212 Obesity complicating pregnancy, second trimester: Secondary | ICD-10-CM | POA: Insufficient documentation

## 2021-05-04 DIAGNOSIS — Z363 Encounter for antenatal screening for malformations: Secondary | ICD-10-CM | POA: Insufficient documentation

## 2021-05-04 DIAGNOSIS — O09292 Supervision of pregnancy with other poor reproductive or obstetric history, second trimester: Secondary | ICD-10-CM | POA: Diagnosis not present

## 2021-05-04 DIAGNOSIS — Z3A26 26 weeks gestation of pregnancy: Secondary | ICD-10-CM | POA: Diagnosis not present

## 2021-05-04 DIAGNOSIS — Z362 Encounter for other antenatal screening follow-up: Secondary | ICD-10-CM | POA: Diagnosis present

## 2021-05-05 ENCOUNTER — Other Ambulatory Visit: Payer: Self-pay | Admitting: *Deleted

## 2021-05-05 DIAGNOSIS — Z6838 Body mass index (BMI) 38.0-38.9, adult: Secondary | ICD-10-CM

## 2021-05-12 ENCOUNTER — Other Ambulatory Visit: Payer: Self-pay

## 2021-05-12 ENCOUNTER — Encounter: Payer: Self-pay | Admitting: Obstetrics and Gynecology

## 2021-05-12 ENCOUNTER — Ambulatory Visit (INDEPENDENT_AMBULATORY_CARE_PROVIDER_SITE_OTHER): Payer: Managed Care, Other (non HMO) | Admitting: Obstetrics and Gynecology

## 2021-05-12 ENCOUNTER — Other Ambulatory Visit: Payer: Managed Care, Other (non HMO)

## 2021-05-12 VITALS — BP 126/80 | HR 84 | Wt 248.3 lb

## 2021-05-12 DIAGNOSIS — Z23 Encounter for immunization: Secondary | ICD-10-CM | POA: Diagnosis not present

## 2021-05-12 DIAGNOSIS — Z3482 Encounter for supervision of other normal pregnancy, second trimester: Secondary | ICD-10-CM | POA: Diagnosis not present

## 2021-05-12 DIAGNOSIS — Z3A28 28 weeks gestation of pregnancy: Secondary | ICD-10-CM | POA: Diagnosis not present

## 2021-05-12 LAB — POCT URINALYSIS DIPSTICK OB
Bilirubin, UA: NEGATIVE
Blood, UA: NEGATIVE
Glucose, UA: NEGATIVE
Ketones, UA: NEGATIVE
Leukocytes, UA: NEGATIVE
Nitrite, UA: NEGATIVE
POC,PROTEIN,UA: NEGATIVE
Spec Grav, UA: 1.01 (ref 1.010–1.025)
Urobilinogen, UA: 0.2 E.U./dL
pH, UA: 6.5 (ref 5.0–8.0)

## 2021-05-12 NOTE — Progress Notes (Signed)
ROB: Denies problems today.  Simply says "I feel pregnant".  Doing 1 hour GCT today.  Taking vitamins and aspirin as directed.  Reports daily fetal movement.

## 2021-05-12 NOTE — Patient Instructions (Signed)
Rosen's Emergency Medicine: Concepts and Clinical Practice (9th ed., pp. 2296- 2312). Elsevier.">  Braxton Hicks Contractions  Contractions of the uterus can occur throughout pregnancy, but they are not always a sign that you are in labor. You may have practice contractions called Braxton Hicks contractions. These false labor contractions are sometimesconfused with true labor. What are Braxton Hicks contractions? Braxton Hicks contractions are tightening movements that occur in the muscles of the uterus before labor. Unlike true labor contractions, these contractions do not result in opening (dilation) and thinning of the lowest part of the uterus (cervix). Toward the end of pregnancy (32-34 weeks), Braxton Hicks contractions can happen more often and may become stronger. These contractions are sometimesdifficult to tell apart from true labor because they can be very uncomfortable. How to tell the difference between true labor and false labor True labor Contractions last 30-70 seconds. Contractions become very regular. Discomfort is usually felt in the top of the uterus, and it spreads to the lower abdomen and low back. Contractions do not go away with walking. Contractions usually become stronger and more frequent. The cervix dilates and gets thinner. False labor Contractions are usually shorter, weaker, and farther apart than true labor contractions. Contractions are usually irregular. Contractions are often felt in the front of the lower abdomen and in the groin. Contractions may go away when you walk around or change positions while lying down. The cervix usually does not dilate or become thin. Sometimes, the only way to tell if you are in true labor is for your healthcare provider to look for changes in your cervix. Your health care provider will do a physical exam and may monitor your contractions. If you are in true labor, your health care provider will send youhome with instructions  about when to return to the hospital. You may continue to have Braxton Hicks contractions until you go into truelabor. Follow these instructions at home:  Take over-the-counter and prescription medicines only as told by your health care provider. If Braxton Hicks contractions are making you uncomfortable: Change your position from lying down or resting to walking, or change from walking to resting. Sit and rest in a tub of warm water. Drink enough fluid to keep your urine pale yellow. Dehydration may cause these contractions. Do slow and deep breathing several times an hour. Keep all follow-up visits. This is important. Contact a health care provider if: You have a fever. You have continuous pain in your abdomen. Your contractions become stronger, more regular, and closer together. You pass blood-tinged mucus. Get help right away if: You have fluid leaking or gushing from your vagina. You have bright red blood coming from your vagina. Your baby is not moving inside you as much as it used to. Summary You may have practice contractions called Braxton Hicks contractions. These false labor contractions are sometimes confused with true labor. Braxton Hicks contractions are usually shorter, weaker, farther apart, and less regular than true labor contractions. True labor contractions usually become stronger, more regular, and more frequent. Manage discomfort from Braxton Hicks contractions by changing position, resting in a warm bath, practicing deep breathing, and drinking plenty of water. Keep all follow-up visits. Contact your health care provider if your contractions become stronger, more regular, and closer together. This information is not intended to replace advice given to you by your health care provider. Make sure you discuss any questions you have with your healthcare provider. Document Revised: 09/20/2020 Document Reviewed: 09/20/2020 Elsevier Patient Education  2022 Elsevier    Inc.  

## 2021-05-13 ENCOUNTER — Other Ambulatory Visit: Payer: Self-pay

## 2021-05-13 DIAGNOSIS — Z8632 Personal history of gestational diabetes: Secondary | ICD-10-CM

## 2021-05-13 DIAGNOSIS — O99810 Abnormal glucose complicating pregnancy: Secondary | ICD-10-CM

## 2021-05-13 DIAGNOSIS — Z3483 Encounter for supervision of other normal pregnancy, third trimester: Secondary | ICD-10-CM

## 2021-05-13 LAB — CBC
Hematocrit: 33.9 % — ABNORMAL LOW (ref 34.0–46.6)
Hemoglobin: 11.3 g/dL (ref 11.1–15.9)
MCH: 30.2 pg (ref 26.6–33.0)
MCHC: 33.3 g/dL (ref 31.5–35.7)
MCV: 91 fL (ref 79–97)
Platelets: 226 10*3/uL (ref 150–450)
RBC: 3.74 x10E6/uL — ABNORMAL LOW (ref 3.77–5.28)
RDW: 12.7 % (ref 11.7–15.4)
WBC: 8.8 10*3/uL (ref 3.4–10.8)

## 2021-05-13 LAB — GLUCOSE, 1 HOUR GESTATIONAL: Gestational Diabetes Screen: 183 mg/dL — ABNORMAL HIGH (ref 65–139)

## 2021-05-13 LAB — RPR: RPR Ser Ql: NONREACTIVE

## 2021-05-26 ENCOUNTER — Ambulatory Visit: Payer: Managed Care, Other (non HMO) | Admitting: *Deleted

## 2021-05-31 ENCOUNTER — Encounter: Payer: Self-pay | Admitting: Obstetrics and Gynecology

## 2021-05-31 ENCOUNTER — Ambulatory Visit (INDEPENDENT_AMBULATORY_CARE_PROVIDER_SITE_OTHER): Payer: Managed Care, Other (non HMO) | Admitting: Obstetrics and Gynecology

## 2021-05-31 ENCOUNTER — Other Ambulatory Visit: Payer: Self-pay

## 2021-05-31 VITALS — BP 126/84 | HR 103 | Wt 251.4 lb

## 2021-05-31 DIAGNOSIS — O26893 Other specified pregnancy related conditions, third trimester: Secondary | ICD-10-CM

## 2021-05-31 DIAGNOSIS — N898 Other specified noninflammatory disorders of vagina: Secondary | ICD-10-CM

## 2021-05-31 DIAGNOSIS — Z3483 Encounter for supervision of other normal pregnancy, third trimester: Secondary | ICD-10-CM | POA: Diagnosis not present

## 2021-05-31 DIAGNOSIS — Z3A30 30 weeks gestation of pregnancy: Secondary | ICD-10-CM | POA: Diagnosis not present

## 2021-05-31 LAB — POCT URINALYSIS DIPSTICK OB
Blood, UA: NEGATIVE
Glucose, UA: NEGATIVE
Ketones, UA: NEGATIVE
Nitrite, UA: NEGATIVE
Spec Grav, UA: 1.025 (ref 1.010–1.025)
Urobilinogen, UA: 0.2 E.U./dL
pH, UA: 6 (ref 5.0–8.0)

## 2021-05-31 NOTE — Progress Notes (Signed)
OB-Pt present due to sending mychart concerned about possible fluid leaking. Pt stated that she is having a lot of vaginal pressure and pain, discharge that smells salty and sweet.

## 2021-05-31 NOTE — Progress Notes (Signed)
Problem OB: Patient complains of watery discharge with "sweet/salty" smell.  Notes that she typically smells this when her water breaks as she noted it around that time in her last pregnancies.  Concerned for preterm membrane rupture. Speculum exam performed with no fluid noted in vault, just small amount of yellow dischage. Ferning and nitrazine negative. Wet prep with WBCs, but no clue cells, trichomonads, or yeast. Given reassurance. RTC for routine OB visit as scheduled.

## 2021-06-02 ENCOUNTER — Ambulatory Visit: Payer: Managed Care, Other (non HMO) | Attending: Obstetrics and Gynecology | Admitting: Obstetrics and Gynecology

## 2021-06-02 ENCOUNTER — Ambulatory Visit: Payer: Managed Care, Other (non HMO) | Admitting: *Deleted

## 2021-06-02 ENCOUNTER — Other Ambulatory Visit: Payer: Self-pay

## 2021-06-02 ENCOUNTER — Encounter: Payer: Managed Care, Other (non HMO) | Attending: Obstetrics and Gynecology | Admitting: *Deleted

## 2021-06-02 ENCOUNTER — Other Ambulatory Visit: Payer: Self-pay | Admitting: Obstetrics and Gynecology

## 2021-06-02 ENCOUNTER — Ambulatory Visit: Payer: Managed Care, Other (non HMO) | Attending: Obstetrics and Gynecology

## 2021-06-02 ENCOUNTER — Encounter: Payer: Self-pay | Admitting: *Deleted

## 2021-06-02 VITALS — BP 100/70 | Ht 65.0 in | Wt 251.0 lb

## 2021-06-02 VITALS — BP 127/63 | HR 95

## 2021-06-02 DIAGNOSIS — Z6838 Body mass index (BMI) 38.0-38.9, adult: Secondary | ICD-10-CM | POA: Diagnosis present

## 2021-06-02 DIAGNOSIS — Z362 Encounter for other antenatal screening follow-up: Secondary | ICD-10-CM

## 2021-06-02 DIAGNOSIS — Z8632 Personal history of gestational diabetes: Secondary | ICD-10-CM

## 2021-06-02 DIAGNOSIS — E669 Obesity, unspecified: Secondary | ICD-10-CM | POA: Diagnosis not present

## 2021-06-02 DIAGNOSIS — O99213 Obesity complicating pregnancy, third trimester: Secondary | ICD-10-CM

## 2021-06-02 DIAGNOSIS — O09299 Supervision of pregnancy with other poor reproductive or obstetric history, unspecified trimester: Secondary | ICD-10-CM

## 2021-06-02 DIAGNOSIS — Z3A31 31 weeks gestation of pregnancy: Secondary | ICD-10-CM | POA: Diagnosis present

## 2021-06-02 DIAGNOSIS — O321XX0 Maternal care for breech presentation, not applicable or unspecified: Secondary | ICD-10-CM

## 2021-06-02 DIAGNOSIS — O24410 Gestational diabetes mellitus in pregnancy, diet controlled: Secondary | ICD-10-CM | POA: Diagnosis not present

## 2021-06-02 DIAGNOSIS — O24419 Gestational diabetes mellitus in pregnancy, unspecified control: Secondary | ICD-10-CM | POA: Diagnosis present

## 2021-06-02 NOTE — Patient Instructions (Signed)
Read booklet on Gestational Diabetes Follow Gestational Meal Planning Guidelines Limit caffeine and artificial sweeteners Complete a 3 Day Food Record and bring to next appointment Check blood sugars 4 x day - before breakfast and 2 hrs after every meal and record  Bring blood sugar log to all appointments Call MD for prescription for meter strips and lancets Strips   Accu-Chek Guide  Lancets   Accu-Chek Softclix Purchase urine ketone strips if instructed by MD and check urine ketones every am:  If + increase bedtime snack to 1 protein and 2 carbohydrate servings Walk 20-30 minutes at least 5 x week if permitted by MD  

## 2021-06-02 NOTE — Progress Notes (Signed)
Diabetes Self-Management Education  Visit Type: First/Initial  Appt. Start Time: 0900 Appt. End Time: 1100  06/02/2021  Jennifer Best, identified by name and date of birth, is a 30 y.o. female with a diagnosis of Diabetes: Gestational Diabetes.   ASSESSMENT  Blood pressure 100/70, height 5\' 5"  (1.651 m), weight 251 lb (113.9 kg). Last menstrual period 10/28/2020, estimated date of delivery 08/04/2021 Body mass index is 41.77 kg/m.   Diabetes Self-Management Education - 06/02/21 1124       Visit Information   Visit Type First/Initial      Initial Visit   Diabetes Type Gestational Diabetes    Are you currently following a meal plan? No    Are you taking your medications as prescribed? Yes    Date Diagnosed 1 month      Health Coping   How would you rate your overall health? Good      Psychosocial Assessment   Patient Belief/Attitude about Diabetes Other (comment)   "indifferent"   Self-care barriers None    Self-management support Doctor's office    Patient Concerns Nutrition/Meal planning;Monitoring;Other (comment)   Maintian a healthy pregnancy   Special Needs None    Preferred Learning Style Visual    Learning Readiness Ready    How often do you need to have someone help you when you read instructions, pamphlets, or other written materials from your doctor or pharmacy? 1 - Never    What is the last grade level you completed in school? [employer]      Pre-Education Assessment   Patient understands the diabetes disease and treatment process. Needs Instruction    Patient understands incorporating nutritional management into lifestyle. Needs Instruction    Patient undertands incorporating physical activity into lifestyle. Needs Instruction    Patient understands using medications safely. Needs Instruction    Patient understands monitoring blood glucose, interpreting and using results Needs Instruction    Patient understands prevention, detection, and treatment of acute  complications. Needs Instruction    Patient understands prevention, detection, and treatment of chronic complications. Needs Instruction    Patient understands how to develop strategies to address psychosocial issues. Needs Instruction    Patient understands how to develop strategies to promote health/change behavior. Needs Instruction      Complications   Last HgB A1C per patient/outside source 5 %   01/03/2021   How often do you check your blood sugar? 0 times/day (not testing)   Provided Accu-Chek Guide Me meter and instructed on use. BG upon return demonstration was 100 mg/dL at 03/03/2021 am - 2 hrs pp.   Have you had a dilated eye exam in the past 12 months? Yes    Have you had a dental exam in the past 12 months? Yes    Are you checking your feet? No      Dietary Intake   Breakfast eggs, bacon; [employer] sandwich or egg bowl    Snack (morning) 1-2 snacks/day - when I get hungry popcorn, fruit (reports that she eats all kinds of fruits)    Lunch peanut butter and jelly sandwich or heated meat sandwich or salads    Dinner chicken,  pork, steak, occasional fish; potatoes, corn, beans, rice, pasta; lettuce, tomatoes, carrots, cuccumbers, broccoli, cauliflower    Beverage(s) water, coffee, diet soda      Exercise   Exercise Type Light (walking / raking leaves)   light weights and cardio   How many days per week to you exercise? 2.5  How many minutes per day do you exercise? 90    Total minutes per week of exercise 225      Patient Education   Previous Diabetes Education No    Disease state  Definition of diabetes, type 1 and 2, and the diagnosis of diabetes;Factors that contribute to the development of diabetes    Nutrition management  Role of diet in the treatment of diabetes and the relationship between the three main macronutrients and blood glucose level;Food label reading, portion sizes and measuring food.;Reviewed blood glucose goals for pre and post meals and how to evaluate the  patients' food intake on their blood glucose level.    Physical activity and exercise  Role of exercise on diabetes management, blood pressure control and cardiac health.    Medications Other (comment)   Limited use of oral medications during pregnancy and potential for insulin.   Monitoring Purpose and frequency of SMBG.;Taught/discussed recording of test results and interpretation of SMBG.;Identified appropriate SMBG and/or A1C goals.;Ketone testing, when, how.    Chronic complications Relationship between chronic complications and blood glucose control    Psychosocial adjustment Identified and addressed patients feelings and concerns about diabetes    Preconception care Pregnancy and GDM  Role of pre-pregnancy blood glucose control on the development of the fetus;Reviewed with patient blood glucose goals with pregnancy;Role of family planning for patients with diabetes      Individualized Goals (developed by patient)   Reducing Risk Other (comment)   prevent diabetes complications, maintain a healthy pregnancy     Outcomes   Expected Outcomes Demonstrated interest in learning. Expect positive outcomes             Individualized Plan for Diabetes Self-Management Training:   Learning Objective:  Patient will have a greater understanding of diabetes self-management. Patient education plan is to attend individual and/or group sessions per assessed needs and concerns.   Plan:   Patient Instructions  Read booklet on Gestational Diabetes Follow Gestational Meal Planning Guidelines Limit caffeine and artificial sweeteners Complete a 3 Day Food Record and bring to next appointment Check blood sugars 4 x day - before breakfast and 2 hrs after every meal and record  Bring blood sugar log to all appointments Call MD for prescription for meter strips and lancets Strips   Accu-Chek Guide  Lancets   Accu-Chek Softclix Purchase urine ketone strips if instructed by MD and check urine ketones  every am:  If + increase bedtime snack to 1 protein and 2 carbohydrate servings Walk 20-30 minutes at least 5 x week if permitted by MD  Expected Outcomes:  Demonstrated interest in learning. Expect positive outcomes  Education material provided:  Gestational Booklet Gestational Meal Planning Guidelines Simple Meal Plan Viewed Gestational Diabetes Video Meter = Accu-Chek Guide Me 3 Day Food Record Goals for a Healthy Pregnancy  If problems or questions, patient to contact team via:  Sharion Settler, RN, CCM, CDCES [phone]  Future DSME appointment:  June 08, 2021 with the dietitian

## 2021-06-02 NOTE — Progress Notes (Signed)
Maternal-Fetal Medicine   Name: Jennifer Best DOB: 11/16/1991 MRN: [id number] Referring Provider: Hildred Laser, MD   I had the pleasure of seeing [facility]. Hagger [facility] at [facility].  She returned for fetal growth assessment.  Patient has a new diagnosis of gestational diabetes and will be checking her blood glucose from now. She does not report any chronic medical conditions. Obstetric history significant for 3 term vaginal deliveries. Blood pressure today at her office is 127/63 mmHg. Ultrasound On today's ultrasound, the estimated fetal weight is at the 95th percentile.  Abdominal circumference measurement is at the 90th percentile.  Amniotic fluid is normal and good fetal activity seen. Our concerns include Gestational diabetes -I discussed management of gestational diabetes that includes diet, exercise and oral hypoglycemics or insulin. -About 85% of the cases are well controlled on diet.  Oral hypoglycemics or insulin can be safely administered in pregnancy. -Complications of poorly controlled diabetes include fetal macrosomia leading to shoulder dystocia at birth.  Neonatal complications include increased NICU admissions. -I discussed normal blood glucose parameters. -Ultrasound protocol is based on whether diabetes is well controlled on diet or on medications.  If patient requires oral hypoglycemics or insulin, we recommend weekly BPP from [redacted] weeks gestation till delivery Timing of delivery: If diabetes is well controlled on diet, delivery at 39 or 40 weeks is considered.  If diabetes not well controlled early term delivery may be considered. -Type 2 diabetes occurs in about 25% to 40% of the cases.  I discussed postpartum screening at 6 to 12 weeks after delivery. Recommendations -An appointment was made for her to return in 4 weeks for fetal growth assessment. -Weekly BPP from [redacted] weeks gestation till delivery if patient takes metformin or insulin. Thank you for  consultation.  If you have any questions or concerns, please contact me the [facility].  Consultation including face-to-face counseling 30 minutes.

## 2021-06-03 ENCOUNTER — Encounter: Payer: Self-pay | Admitting: Obstetrics and Gynecology

## 2021-06-03 ENCOUNTER — Ambulatory Visit (INDEPENDENT_AMBULATORY_CARE_PROVIDER_SITE_OTHER): Payer: Managed Care, Other (non HMO) | Admitting: Obstetrics and Gynecology

## 2021-06-03 ENCOUNTER — Other Ambulatory Visit: Payer: Self-pay | Admitting: *Deleted

## 2021-06-03 VITALS — BP 128/87 | HR 89 | Wt 251.9 lb

## 2021-06-03 DIAGNOSIS — Z308 Encounter for other contraceptive management: Secondary | ICD-10-CM

## 2021-06-03 DIAGNOSIS — O99210 Obesity complicating pregnancy, unspecified trimester: Secondary | ICD-10-CM

## 2021-06-03 DIAGNOSIS — Z3483 Encounter for supervision of other normal pregnancy, third trimester: Secondary | ICD-10-CM

## 2021-06-03 DIAGNOSIS — O24410 Gestational diabetes mellitus in pregnancy, diet controlled: Secondary | ICD-10-CM

## 2021-06-03 DIAGNOSIS — O24419 Gestational diabetes mellitus in pregnancy, unspecified control: Secondary | ICD-10-CM

## 2021-06-03 DIAGNOSIS — Z3A31 31 weeks gestation of pregnancy: Secondary | ICD-10-CM

## 2021-06-03 LAB — POCT URINALYSIS DIPSTICK OB
Bilirubin, UA: NEGATIVE
Blood, UA: NEGATIVE
Glucose, UA: NEGATIVE
Ketones, UA: NEGATIVE
Leukocytes, UA: NEGATIVE
Nitrite, UA: NEGATIVE
POC,PROTEIN,UA: NEGATIVE
Spec Grav, UA: 1.025 (ref 1.010–1.025)
Urobilinogen, UA: 0.2 E.U./dL
pH, UA: 6.5 (ref 5.0–8.0)

## 2021-06-03 MED ORDER — BLOOD GLUCOSE MONITOR KIT: PACK | 0 refills | Status: DC

## 2021-06-03 NOTE — Addendum Note (Signed)
Addended by: Silvano Bilis on: 06/03/2021 05:02 PM   Modules accepted: Orders

## 2021-06-03 NOTE — Progress Notes (Addendum)
ROB: Patient notes complaints of "lightening crotch" but otherwise doing well.  Recently seen [facility] for newly diagnosed GDM, has 2 days of blood sugars recorded.  2 elevated noted so far (1 fasting of 95 and 1 postprandial breakfast 135). Will f/u at next visit. Desires BTL for contraception.  Discussed other methods of contraception. Signed Medicaid sterilization form. Notes recent growth scan shows breech presentation. Advised that we will f/u again after next sono and manage accordingly.  RTC in 2 weeks.

## 2021-06-03 NOTE — Progress Notes (Signed)
Pt present for routine prenatal care. Pt stated still having lightening crotch.

## 2021-06-07 ENCOUNTER — Telehealth: Payer: Self-pay

## 2021-06-07 NOTE — Telephone Encounter (Signed)
Spoke to pt and scheduled her with [facility] for her next OB visit per ac.

## 2021-06-08 ENCOUNTER — Ambulatory Visit: Payer: Managed Care, Other (non HMO) | Admitting: Dietician

## 2021-06-15 ENCOUNTER — Other Ambulatory Visit: Payer: Self-pay

## 2021-06-15 ENCOUNTER — Encounter: Payer: Self-pay | Admitting: Obstetrics and Gynecology

## 2021-06-15 ENCOUNTER — Ambulatory Visit (INDEPENDENT_AMBULATORY_CARE_PROVIDER_SITE_OTHER): Payer: Managed Care, Other (non HMO) | Admitting: Obstetrics and Gynecology

## 2021-06-15 VITALS — BP 133/80 | HR 109 | Wt 255.2 lb

## 2021-06-15 DIAGNOSIS — Z3A32 32 weeks gestation of pregnancy: Secondary | ICD-10-CM

## 2021-06-15 DIAGNOSIS — O24410 Gestational diabetes mellitus in pregnancy, diet controlled: Secondary | ICD-10-CM

## 2021-06-15 DIAGNOSIS — O99210 Obesity complicating pregnancy, unspecified trimester: Secondary | ICD-10-CM

## 2021-06-15 DIAGNOSIS — Z3403 Encounter for supervision of normal first pregnancy, third trimester: Secondary | ICD-10-CM

## 2021-06-15 LAB — POCT URINALYSIS DIPSTICK OB
Bilirubin, UA: NEGATIVE
Blood, UA: NEGATIVE
Glucose, UA: NEGATIVE
Ketones, UA: NEGATIVE
Leukocytes, UA: NEGATIVE
Nitrite, UA: NEGATIVE
Spec Grav, UA: 1.02 (ref 1.010–1.025)
Urobilinogen, UA: 0.2 E.U./dL
pH, UA: 7 (ref 5.0–8.0)

## 2021-06-15 NOTE — Progress Notes (Signed)
ROB: She is doing well today, no new concerns. 

## 2021-06-15 NOTE — Patient Instructions (Addendum)
Breastfeeding and Breast Care It is normal to have some problems when you start to breastfeed your new baby. But there are things that you can do to take care of yourself and help prevent problems. This includes keeping your breasts healthy and making sure that your baby's mouth attaches (latches) properly to your nipple for feedings. Work with your doctor or breastfeeding specialist to find what works best foryou. How does self-care benefit me? If you keep your breasts healthy and you let your baby attach to your nipples in the right way, you will avoid these problems: Cracked or sore nipples. Breasts becoming overfilled with milk. Plugged milk ducts. Low milk supply. Breast swelling or infection. How does self-care benefit my baby? By preventing problems with your breasts, you will ensure that your baby willfeed well and will gain the right amount of weight. What actions can I take to care for myself during breastfeeding? Best ways to breastfeed Always make sure that your baby latches properly to breastfeed. Make sure that your baby is in a proper position. Try different breastfeeding positions to find one that works best for you and your baby. Breastfeed when you feel like you need to make your breasts less full or when your baby shows signs of hunger. This is called "breastfeeding on demand." Do not delay feedings. Try to relax when it is time to feed your baby. This helps your body release milk from your breast. To help increase milk flow, do these things before feeding: Remove a small amount of milk from your breast. Use a pump or squeeze with your hand. Apply warm, moist heat to your breast. Do this in the shower or use hand towels soaked with warm water. Massage your breasts. Do this when you are breastfeeding as well. Caring for your breasts     To help your breasts stay healthy and keep them from getting too dry: Avoid using soap on your nipples. Let your nipples air-dry for  3-4 minutes after each feeding. Do not use things like a hair dryer to dry your breasts. This can make the skin dry and will cause irritation and pain. Use only cotton bra pads to soak up breast milk that leaks. Change the pads if they become soaked with milk. If you use bra pads that can be thrown away, change them often. Put some lanolin on your nipples after breastfeeding. Pure lanolin does not need to be washed off your nipple before you feed your baby again. Pure lanolin is not harmful to your baby. Rub some breast milk into your nipples: Use your hand to squeeze out a few drops of breast milk. Gently massage the milk into your nipples. Let your nipples air-dry. Wear a supportive nursing bra. Avoid wearing: Tight clothing. Underwire bras or bras that put pressure on your breasts. Use ice to help relieve pain or swelling of your breasts: Put ice in a plastic bag. Place a towel between your skin and the bag. Leave the ice on for 20 minutes, 2-3 times a day. Follow these instructions at home: Drink enough fluid to keep your pee (urine) pale yellow. Get plenty of rest. Sleep when your baby sleeps. Talk to your doctor or breastfeeding specialist before taking any herbal supplements. Eat a balanced diet. This includes fruits, vegetables, whole grains, lean proteins, and dairy or dairy alternatives Contact a health care provider if: You have nipple pain. You have cracking or soreness in your nipples that lasts longer than 1 week. Your breasts are  overfilled with milk, and this lasts longer than 48 hours. You have a fever. You have pus-like fluid coming from your nipple. You have redness, a rash, swelling, itching, or burning on your breast. Your baby does not gain weight. Your baby loses weight. Your baby is not feeding regularly or is very sleepy and lacks energy. Summary There are things that you can do to take care of yourself and help prevent many common breastfeeding problems. Always  make sure that your baby's mouth attaches (latches) to your nipple properly to breastfeed. Keep your nipples from getting too dry, drink plenty of fluid, and get plenty of rest. Feed on demand. Do not delay feedings. This information is not intended to replace advice given to you by your health care provider. Make sure you discuss any questions you have with your health care provider. Document Revised: 05/04/2020 Document Reviewed: 05/04/2020 Elsevier Patient Education  2022 Elsevier Inc.    Common Medications Safe in Pregnancy  Acne:      Constipation:  Benzoyl Peroxide     Colace  Clindamycin      Dulcolax Suppository  Topica Erythromycin     Fibercon  Salicylic Acid      Metamucil         Miralax AVOID:        Senakot   Accutane    Cough:  Retin-A       Cough Drops  Tetracycline      Phenergan w/ Codeine if Rx  Minocycline      Robitussin (Plain & DM)  Antibiotics:     Crabs/Lice:  Ceclor       RID  Cephalosporins    AVOID:  E-Mycins      Kwell  Keflex  Macrobid/Macrodantin   Diarrhea:  Penicillin      Kao-Pectate  Zithromax      Imodium AD         PUSH FLUIDS AVOID:       Cipro     Fever:  Tetracycline      Tylenol (Regular or Extra  Minocycline       Strength)  Levaquin      Extra Strength-Do not          Exceed 8 tabs/24 hrs Caffeine:        <200mg/day (equiv. To 1 cup of coffee or  approx. 3 12 oz sodas)         Gas: Cold/Hayfever:       Gas-X  Benadryl      Mylicon  Claritin       Phazyme  **Claritin-D        Chlor-Trimeton    Headaches:  Dimetapp      ASA-Free Excedrin  Drixoral-Non-Drowsy     Cold Compress  Mucinex (Guaifenasin)     Tylenol (Regular or Extra  Sudafed/Sudafed-12 Hour     Strength)  **Sudafed PE Pseudoephedrine   Tylenol Cold & Sinus     Vicks Vapor Rub  Zyrtec  **AVOID if Problems With Blood Pressure         Heartburn: Avoid lying down for at least 1 hour after meals  Aciphex      Maalox     Rash:  Milk of  Magnesia     Benadryl    Mylanta       1% Hydrocortisone Cream  Pepcid  Pepcid Complete   Sleep Aids:  Prevacid      Ambien   Prilosec       Benadryl    Rolaids       Chamomile Tea  Tums (Limit 4/day)     Unisom         Tylenol PM         Warm milk-add vanilla or  Hemorrhoids:       Sugar for taste  Anusol/Anusol H.C.  (RX: Analapram 2.5%)  Sugar Substitutes:  Hydrocortisone OTC     Ok in moderation  Preparation H      Tucks        Vaseline lotion applied to tissue with wiping    Herpes:     Throat:  Acyclovir      Oragel  Famvir  Valtrex     Vaccines:         Flu Shot Leg Cramps:       *Gardasil  Benadryl      Hepatitis A         Hepatitis B Nasal Spray:       Pneumovax  Saline Nasal Spray     Polio Booster         Tetanus Nausea:       Tuberculosis test or PPD  Vitamin B6 25 mg TID   AVOID:    Dramamine      *Gardasil  Emetrol       Live Poliovirus  Ginger Root 250 mg QID    MMR (measles, mumps &  High Complex Carbs @ Bedtime    rebella)  Sea Bands-Accupressure    Varicella (Chickenpox)  Unisom 1/2 tab TID     *No known complications           If received before Pain:         Known pregnancy;   Darvocet       Resume series after  Lortab        Delivery  Percocet    Yeast:   Tramadol      Femstat  Tylenol 3      Gyne-lotrimin  Ultram       Monistat  Vicodin           MISC:         All Sunscreens           Hair Coloring/highlights          Insect Repellant's          (Including DEET)         Mystic Tans  

## 2021-06-15 NOTE — Progress Notes (Signed)
ROB: Doing well, no major issues.  Blood sugars reviewed, overall normal with exception of 1 postprandial lunch (150) and 3 postprandial dinners (130-140).  Discussed stricter compliance with diet in evenings.  Will continue to monitor.  Patient reports that she prefers female providers from now on. RTC in 2 weeks.

## 2021-06-16 ENCOUNTER — Encounter: Payer: Managed Care, Other (non HMO) | Admitting: Obstetrics and Gynecology

## 2021-06-22 ENCOUNTER — Encounter: Payer: Self-pay | Admitting: Dietician

## 2021-06-22 NOTE — Progress Notes (Signed)
Patient did not wish to reschedule her cancelled appointment from 06/08/21. Sent notification to referring provider.

## 2021-06-27 ENCOUNTER — Other Ambulatory Visit: Payer: Self-pay | Admitting: Obstetrics and Gynecology

## 2021-06-30 ENCOUNTER — Ambulatory Visit: Payer: Managed Care, Other (non HMO) | Admitting: *Deleted

## 2021-06-30 ENCOUNTER — Encounter: Payer: Self-pay | Admitting: *Deleted

## 2021-06-30 ENCOUNTER — Other Ambulatory Visit: Payer: Self-pay

## 2021-06-30 ENCOUNTER — Ambulatory Visit: Payer: Managed Care, Other (non HMO) | Attending: Obstetrics and Gynecology

## 2021-06-30 VITALS — BP 123/65 | HR 98

## 2021-06-30 DIAGNOSIS — O99213 Obesity complicating pregnancy, third trimester: Secondary | ICD-10-CM | POA: Diagnosis not present

## 2021-06-30 DIAGNOSIS — O24410 Gestational diabetes mellitus in pregnancy, diet controlled: Secondary | ICD-10-CM | POA: Insufficient documentation

## 2021-06-30 DIAGNOSIS — O24419 Gestational diabetes mellitus in pregnancy, unspecified control: Secondary | ICD-10-CM | POA: Diagnosis present

## 2021-06-30 DIAGNOSIS — O09293 Supervision of pregnancy with other poor reproductive or obstetric history, third trimester: Secondary | ICD-10-CM

## 2021-06-30 DIAGNOSIS — O3663X0 Maternal care for excessive fetal growth, third trimester, not applicable or unspecified: Secondary | ICD-10-CM

## 2021-06-30 DIAGNOSIS — Z3A35 35 weeks gestation of pregnancy: Secondary | ICD-10-CM

## 2021-06-30 DIAGNOSIS — Z362 Encounter for other antenatal screening follow-up: Secondary | ICD-10-CM

## 2021-06-30 DIAGNOSIS — E669 Obesity, unspecified: Secondary | ICD-10-CM

## 2021-07-01 ENCOUNTER — Encounter: Payer: Self-pay | Admitting: Obstetrics and Gynecology

## 2021-07-01 ENCOUNTER — Ambulatory Visit (INDEPENDENT_AMBULATORY_CARE_PROVIDER_SITE_OTHER): Payer: Managed Care, Other (non HMO) | Admitting: Obstetrics and Gynecology

## 2021-07-01 VITALS — BP 125/81 | HR 96 | Wt 261.8 lb

## 2021-07-01 DIAGNOSIS — Z3A35 35 weeks gestation of pregnancy: Secondary | ICD-10-CM

## 2021-07-01 DIAGNOSIS — O0993 Supervision of high risk pregnancy, unspecified, third trimester: Secondary | ICD-10-CM

## 2021-07-01 DIAGNOSIS — O24410 Gestational diabetes mellitus in pregnancy, diet controlled: Secondary | ICD-10-CM

## 2021-07-01 DIAGNOSIS — O99210 Obesity complicating pregnancy, unspecified trimester: Secondary | ICD-10-CM

## 2021-07-01 LAB — POCT URINALYSIS DIPSTICK OB
Bilirubin, UA: NEGATIVE
Blood, UA: NEGATIVE
Glucose, UA: NEGATIVE
Ketones, UA: NEGATIVE
Leukocytes, UA: NEGATIVE
Nitrite, UA: NEGATIVE
POC,PROTEIN,UA: NEGATIVE
Spec Grav, UA: 1.01 (ref 1.010–1.025)
Urobilinogen, UA: 0.2 E.U./dL
pH, UA: 7 (ref 5.0–8.0)

## 2021-07-01 NOTE — Progress Notes (Signed)
ROB: She is doing well, no concerns today. 

## 2021-07-01 NOTE — Progress Notes (Signed)
ROB: Notes some difficulties walking but using belly band which helps. S/p ultrasound for growth due to GDM (diet) and obesity, fetus in 97%ile. Reviewed blood sugars, all except 1 postprandial dinner wnl (notes she has been working to modify her diet better). Discussed IOL at 39 weeks. RTC in 1 week, to begin NSTs.

## 2021-07-01 NOTE — Patient Instructions (Signed)

## 2021-07-08 ENCOUNTER — Other Ambulatory Visit: Payer: Managed Care, Other (non HMO)

## 2021-07-08 ENCOUNTER — Other Ambulatory Visit: Payer: Self-pay

## 2021-07-08 ENCOUNTER — Ambulatory Visit (INDEPENDENT_AMBULATORY_CARE_PROVIDER_SITE_OTHER): Payer: Managed Care, Other (non HMO) | Admitting: Certified Nurse Midwife

## 2021-07-08 VITALS — BP 139/79 | HR 108 | Wt 257.4 lb

## 2021-07-08 DIAGNOSIS — Z3A36 36 weeks gestation of pregnancy: Secondary | ICD-10-CM | POA: Diagnosis not present

## 2021-07-08 DIAGNOSIS — Z113 Encounter for screening for infections with a predominantly sexual mode of transmission: Secondary | ICD-10-CM

## 2021-07-08 DIAGNOSIS — O24410 Gestational diabetes mellitus in pregnancy, diet controlled: Secondary | ICD-10-CM

## 2021-07-08 DIAGNOSIS — O0993 Supervision of high risk pregnancy, unspecified, third trimester: Secondary | ICD-10-CM

## 2021-07-08 LAB — POCT URINALYSIS DIPSTICK OB
Bilirubin, UA: NEGATIVE
Blood, UA: NEGATIVE
Glucose, UA: NEGATIVE
Ketones, UA: NEGATIVE
Leukocytes, UA: NEGATIVE
Nitrite, UA: NEGATIVE
POC,PROTEIN,UA: NEGATIVE
Spec Grav, UA: 1.015 (ref 1.010–1.025)
Urobilinogen, UA: 0.2 E.U./dL
pH, UA: 6.5 (ref 5.0–8.0)

## 2021-07-08 LAB — OB RESULTS CONSOLE GC/CHLAMYDIA: Gonorrhea: NEGATIVE

## 2021-07-08 NOTE — Patient Instructions (Signed)

## 2021-07-08 NOTE — Progress Notes (Signed)
ROB and NST for GDM diet controled, elevated  Body mass index is 42.83 kg/m.   Doing well, feels good movement. Pt states she did not bring her log today. She says her fasting have been within normal range 85-95, she has had 3 out of range in the evening after dinner with the highest reading 140. GBS/swabs collected today. Pt To follow up 1 wk for NST and ROB with Dr. Valentino Saxon   NST : reactive, category 1  Baseline: 135 Moderate variability Accelerations present Decelerations absent  CTX: none  Dr.Evans consulted on strip.

## 2021-07-10 LAB — STREP GP B NAA: Strep Gp B NAA: POSITIVE — AB

## 2021-07-13 ENCOUNTER — Encounter: Payer: Managed Care, Other (non HMO) | Admitting: Certified Nurse Midwife

## 2021-07-13 ENCOUNTER — Other Ambulatory Visit: Payer: Managed Care, Other (non HMO)

## 2021-07-13 LAB — GC/CHLAMYDIA PROBE AMP
Chlamydia trachomatis, NAA: NEGATIVE
Neisseria Gonorrhoeae by PCR: NEGATIVE

## 2021-07-15 ENCOUNTER — Other Ambulatory Visit: Payer: Self-pay

## 2021-07-15 ENCOUNTER — Other Ambulatory Visit: Payer: Managed Care, Other (non HMO)

## 2021-07-15 ENCOUNTER — Ambulatory Visit (INDEPENDENT_AMBULATORY_CARE_PROVIDER_SITE_OTHER): Payer: Managed Care, Other (non HMO) | Admitting: Obstetrics and Gynecology

## 2021-07-15 VITALS — BP 132/86 | HR 103 | Ht 65.0 in | Wt 260.3 lb

## 2021-07-15 DIAGNOSIS — O0993 Supervision of high risk pregnancy, unspecified, third trimester: Secondary | ICD-10-CM

## 2021-07-15 DIAGNOSIS — O99210 Obesity complicating pregnancy, unspecified trimester: Secondary | ICD-10-CM

## 2021-07-15 DIAGNOSIS — Z3A37 37 weeks gestation of pregnancy: Secondary | ICD-10-CM | POA: Diagnosis not present

## 2021-07-15 DIAGNOSIS — O99213 Obesity complicating pregnancy, third trimester: Secondary | ICD-10-CM

## 2021-07-15 DIAGNOSIS — Z3403 Encounter for supervision of normal first pregnancy, third trimester: Secondary | ICD-10-CM

## 2021-07-15 DIAGNOSIS — O24410 Gestational diabetes mellitus in pregnancy, diet controlled: Secondary | ICD-10-CM | POA: Diagnosis not present

## 2021-07-18 ENCOUNTER — Observation Stay
Admission: EM | Admit: 2021-07-18 | Discharge: 2021-07-18 | Disposition: A | Discharge status: Home / Self Care | Payer: Managed Care, Other (non HMO) | Attending: Obstetrics and Gynecology | Admitting: Obstetrics and Gynecology

## 2021-07-18 ENCOUNTER — Encounter: Payer: Self-pay | Admitting: Obstetrics and Gynecology

## 2021-07-18 DIAGNOSIS — Z3A37 37 weeks gestation of pregnancy: Secondary | ICD-10-CM

## 2021-07-18 DIAGNOSIS — O24410 Gestational diabetes mellitus in pregnancy, diet controlled: Secondary | ICD-10-CM | POA: Diagnosis not present

## 2021-07-18 DIAGNOSIS — O471 False labor at or after 37 completed weeks of gestation: Principal | ICD-10-CM | POA: Insufficient documentation

## 2021-07-18 DIAGNOSIS — O99210 Obesity complicating pregnancy, unspecified trimester: Secondary | ICD-10-CM | POA: Diagnosis not present

## 2021-07-18 DIAGNOSIS — Z3490 Encounter for supervision of normal pregnancy, unspecified, unspecified trimester: Secondary | ICD-10-CM

## 2021-07-18 LAB — PROTEIN / CREATININE RATIO, URINE
Creatinine, Urine: 107 mg/dL
Protein Creatinine Ratio: 0.11 mg/mg{Cre} (ref 0.00–0.15)
Total Protein, Urine: 12 mg/dL

## 2021-07-18 LAB — CBC
HCT: 31.7 % — ABNORMAL LOW (ref 36.0–46.0)
Hemoglobin: 10.6 g/dL — ABNORMAL LOW (ref 12.0–15.0)
MCH: 29 pg (ref 26.0–34.0)
MCHC: 33.4 g/dL (ref 30.0–36.0)
MCV: 86.6 fL (ref 80.0–100.0)
Platelets: 162 10*3/uL (ref 150–400)
RBC: 3.66 MIL/uL — ABNORMAL LOW (ref 3.87–5.11)
RDW: 13.7 % (ref 11.5–15.5)
WBC: 8.7 10*3/uL (ref 4.0–10.5)
nRBC: 0.2 % (ref 0.0–0.2)

## 2021-07-18 NOTE — OB Triage Note (Signed)
Patient discharged to home and took all personal belongings with her. Patient given AVS. Patient left unit ambulatory to be driven home in private passenger vehicle. OB triage complete.

## 2021-07-18 NOTE — OB Triage Note (Signed)
Notified Dr. Logan Bores of patient's increased blood pressures. New orders given. Will notify patient on plan of care.

## 2021-07-18 NOTE — OB Triage Note (Signed)
[facility] called to Dr. Logan Bores. Cervical exam results given. Patient reports she has had sex a lot in the last past 2 days to ripen her cervix and induce labor. MD stated to continue to monitor and recheck in 2 hours. If no change patient can be discharged to home.

## 2021-07-18 NOTE — OB Triage Note (Signed)
Dr. Logan Bores notified by phone of protein creatinine results of 0.11 and no changes in cervical exam status or increases in BP from initial baseline upon admission. Telephone order given to d/c patient to home with labor precautions and advised to stay hydrated and take breaks from sexual activity to limit uterine irritability and maternal exhaustion. Patient stated verbal understanding of instructions.

## 2021-07-19 NOTE — Discharge Summary (Signed)
    L&D OB Triage Note  SUBJECTIVE Jennifer Best is a 30 y.o. [id number] female at [redacted]w[redacted]d, EDD Estimated Date of Delivery: 08/04/21 who presented to triage with complaints of occasional contractions.  Patient has had multiple episodes of intercourse in the last 2 days trying to "ripen" her cervix.  She denies vaginal bleeding.  OB History  Gravida Para Term Preterm AB Living  6 3 3  0 2 3  SAB IAB Ectopic Multiple Live Births  1 0 1 0 3    # Outcome Date GA Lbr Len/2nd Weight Sex Delivery Anes PTL Lv  6 Current           5 SAB 2020          4 Term 07/02/17 [redacted]w[redacted]d  3062 g M Vag-Spont None  LIV     Name: Pons,BOY Tamyra     Apgar1: 8  Apgar5: 9  3 Ectopic 2017          2 Term 2015 [redacted]w[redacted]d  3221 g F Vag-Spont   LIV  1 Term 2012 [redacted]w[redacted]d  2785 g F Vag-Spont   LIV    No medications prior to admission.     OBJECTIVE  Nursing Evaluation:   BP 138/86   Pulse (!) 111   Temp 98 F (36.7 C) (Oral)   Resp 16   LMP  (LMP Unknown)    Findings:        Moderately increased blood pressure is noted on admission     Normal PC ratio     Blood pressure decreased during brief hospital stay.     No cervical change-no evidence of labor.      NST was performed and has been reviewed by me.  NST INTERPRETATION: Category I  Mode: External Baseline Rate (A): 140 bpm (patient removed from monitor for discharge to home) Variability: Moderate Accelerations: 15 x 15 Decelerations: None     Contraction Frequency (min): 2-5 (with irritability)  ASSESSMENT Impression:  1.  Pregnancy:  2786 at [redacted]w[redacted]d , EDD Estimated Date of Delivery: 08/04/21 2.  Reassuring fetal and maternal status   PLAN 1. Current condition and above findings reviewed.  Reassuring fetal and maternal condition. 2. Discharge home with standard labor precautions given to return to L&D or call the office for problems. 3. Continue routine prenatal care.

## 2021-07-22 ENCOUNTER — Other Ambulatory Visit: Payer: Self-pay

## 2021-07-22 ENCOUNTER — Ambulatory Visit (INDEPENDENT_AMBULATORY_CARE_PROVIDER_SITE_OTHER): Payer: Managed Care, Other (non HMO) | Admitting: Certified Nurse Midwife

## 2021-07-22 ENCOUNTER — Other Ambulatory Visit: Payer: Managed Care, Other (non HMO)

## 2021-07-22 VITALS — BP 139/87 | HR 96 | Wt 262.5 lb

## 2021-07-22 DIAGNOSIS — Z3A38 38 weeks gestation of pregnancy: Secondary | ICD-10-CM

## 2021-07-22 DIAGNOSIS — O24410 Gestational diabetes mellitus in pregnancy, diet controlled: Secondary | ICD-10-CM

## 2021-07-22 DIAGNOSIS — E669 Obesity, unspecified: Secondary | ICD-10-CM | POA: Diagnosis not present

## 2021-07-22 DIAGNOSIS — O0993 Supervision of high risk pregnancy, unspecified, third trimester: Secondary | ICD-10-CM | POA: Diagnosis not present

## 2021-07-22 LAB — POCT URINALYSIS DIPSTICK OB
Bilirubin, UA: NEGATIVE
Blood, UA: NEGATIVE
Glucose, UA: NEGATIVE
Ketones, UA: NEGATIVE
Leukocytes, UA: NEGATIVE
Nitrite, UA: NEGATIVE
POC,PROTEIN,UA: NEGATIVE
Spec Grav, UA: 1.01 (ref 1.010–1.025)
Urobilinogen, UA: 0.2 E.U./dL
pH, UA: 7 (ref 5.0–8.0)

## 2021-07-22 NOTE — Progress Notes (Signed)
ROB and NST for GDM diet controlled and BMI. PT notes good movement. She states she forgot her BS log. Her fasting BS range: 73-95, 2 hr pp 90-120 wkth one elevated 150. She request SVE today. 1.5/60/-2.   NST: reactive Baseline 130 Moderate variability Accelerations present Decelerations absent CTX : irritability   Pt state she has induction scheduled with Dr. Valentino Saxon on 9/7 @ 0500. She will return in one wk with Dr. Valentino Saxon for NST and ROB.   Doreene Burke, CNM

## 2021-07-22 NOTE — Patient Instructions (Signed)
Labor Induction ?Labor induction is when steps are taken to cause a pregnant woman to begin the labor process. Most women go into labor on their own between 37 weeks and 42 weeks of pregnancy. When this does not happen, or when there is a medical need for labor to begin, steps may be taken to induce, or bring on, labor. ?Labor induction causes a pregnant woman's uterus to contract. It also causes the cervix to soften (ripen), open (dilate), and thin out. Usually, labor is not induced before 39 weeks of pregnancy unless there is a medical reason to do so. ?When is labor induction considered? ?Labor induction may be right for you if: ?Your pregnancy lasts longer than 41 to 42 weeks. ?Your placenta is separating from your uterus (placental abruption). ?You have a rupture of membranes and your labor does not begin. ?You have health problems, like diabetes or high blood pressure (preeclampsia) during your pregnancy. ?Your baby has stopped growing or does not have enough amniotic fluid. ?Before labor induction begins, your health care provider will consider the following factors: ?Your medical condition and the baby's condition. ?How many weeks you have been pregnant. ?How mature the baby's lungs are. ?The condition of your cervix. ?The position of the baby. ?The size of your birth canal. ?Tell a health care provider about: ?Any allergies you have. ?All medicines you are taking, including vitamins, herbs, eye drops, creams, and over-the-counter medicines. ?Any problems you or your family members have had with anesthetic medicines. ?Any surgeries you have had. ?Any blood disorders you have. ?Any medical conditions you have. ?What are the risks? ?Generally, this is a safe procedure. However, problems may occur, including: ?Failed induction. ?Changes in fetal heart rate, such as being too high, too low, or irregular (erratic). ?Infection in the mother or the baby. ?Increased risk of having a cesarean delivery. ?Breaking off  (abruption) of the placenta from the uterus. This is rare. ?Rupture of the uterus. This is very rare. ?Your baby could fail to get enough blood flow or oxygen. This can be life-threatening. ?When induction is needed for medical reasons, the benefits generally outweigh the risks. ?What happens during the procedure? ?During the procedure, your health care provider will use one of these methods to induce labor: ?Stripping the membranes. In this method, the amniotic sac tissue is gently separated from the cervix. This causes the following to happen: ?Your cervix stretches, which in turn causes the release of prostaglandins. ?Prostaglandins induce labor and cause the uterus to contract. ?This procedure is often done in an office visit. You will be sent home to wait for contractions to begin. ?Prostaglandin medicine. This medicine starts contractions and causes the cervix to dilate and ripen. This can be taken by mouth (orally) or by being inserted into the vagina (suppository). ?Inserting a small, thin tube (catheter) with a balloon into the vagina and then expanding the balloon with water to dilate the cervix. ?Breaking the water. In this method, a small instrument is used to make a small hole in the amniotic sac. This eventually causes the amniotic sac to break. Contractions should begin within a few hours. ?Medicine to trigger or strengthen contractions. This medicine is given through an IV that is inserted into a vein in your arm. ?This procedure may vary among health care providers and hospitals. ?Where to find more information ?March of Dimes: www.marchofdimes.org ?The American College of Obstetricians and Gynecologists: www.acog.org ?Summary ?Labor induction causes a pregnant woman's uterus to contract. It also causes the cervix   to soften (ripen), open (dilate), and thin out. ?Labor is usually not induced before 39 weeks of pregnancy unless there is a medical reason to do so. ?When induction is needed for medical  reasons, the benefits generally outweigh the risks. ?Talk with your health care provider about which methods of labor induction are right for you. ?This information is not intended to replace advice given to you by your health care provider. Make sure you discuss any questions you have with your health care provider. ?Document Revised: 08/26/2020 Document Reviewed: 08/26/2020 ?Elsevier Patient Education ? 2022 Elsevier Inc. ? ?

## 2021-07-25 ENCOUNTER — Encounter: Payer: Self-pay | Admitting: Obstetrics and Gynecology

## 2021-07-25 ENCOUNTER — Other Ambulatory Visit: Payer: Self-pay

## 2021-07-25 ENCOUNTER — Observation Stay (HOSPITAL_BASED_OUTPATIENT_CLINIC_OR_DEPARTMENT_OTHER)
Admission: EM | Admit: 2021-07-25 | Discharge: 2021-07-25 | Disposition: A | Discharge status: Home / Self Care | Payer: Managed Care, Other (non HMO) | Source: Home / Self Care | Admitting: Obstetrics and Gynecology

## 2021-07-25 DIAGNOSIS — O99213 Obesity complicating pregnancy, third trimester: Secondary | ICD-10-CM | POA: Diagnosis not present

## 2021-07-25 DIAGNOSIS — O479 False labor, unspecified: Secondary | ICD-10-CM | POA: Diagnosis present

## 2021-07-25 DIAGNOSIS — Z3A38 38 weeks gestation of pregnancy: Secondary | ICD-10-CM

## 2021-07-25 DIAGNOSIS — O99210 Obesity complicating pregnancy, unspecified trimester: Secondary | ICD-10-CM

## 2021-07-25 DIAGNOSIS — O24410 Gestational diabetes mellitus in pregnancy, diet controlled: Secondary | ICD-10-CM

## 2021-07-25 DIAGNOSIS — O368330 Maternal care for abnormalities of the fetal heart rate or rhythm, third trimester, not applicable or unspecified: Secondary | ICD-10-CM | POA: Insufficient documentation

## 2021-07-25 LAB — RUPTURE OF MEMBRANE (ROM)PLUS: Rom Plus: NEGATIVE

## 2021-07-25 NOTE — OB Triage Note (Signed)
Patient is a 30 yo, G6P3, at 38 weeks 4 days. Patient presents with complaints of contractions every 5 minutes that started at approximately 2200 on 07/24/21. Pt reports she felt a possible trickle of fluid upon arrival to unit and reports some bloody show. Pt reports +FM. Monitors applied and assessing. VSS. Initial fetal heart tone 155. Will continue to monitor.

## 2021-07-25 NOTE — Final Progress Note (Signed)
L&D OB Triage Note  Jennifer Best is a 30 y.o. [id number] female at [redacted]w[redacted]d, EDD Estimated Date of Delivery: 08/04/21 who presented to triage for complaints of contractions q 5 minutes that began at ~ 10 pm last night.  She was evaluated by the nurses with no significant findings for active labor. Vital signs stable. An NST was performed and has been reviewed by MD.    Physical Exam:  Blood pressure 139/81, pulse 96, temperature 98 F (36.7 C), temperature source Oral, resp. rate 18. Dilation: 2 Effacement (%): 60 Cervical Position: Middle Station: -2 Presentation: Vertex Exam by:: Rella Larve RN    NST INTERPRETATION: Indications: rule out uterine contractions  Mode: External Baseline Rate (A): 140 bpm (fht) Variability: Moderate Accelerations: 15 x 15 Decelerations: None     Contraction Frequency (min): 2.5-10  Impression: reactive   Plan: NST performed was reviewed and was found to be reactive. No cervical change observed after 1.5 hrs of monitoring. She was discharged home with bleeding/labor precautions.  Continue routine prenatal care. Follow up with OB/GYN as previously scheduled.     Hildred Laser, MD [facility]

## 2021-07-25 NOTE — Progress Notes (Signed)
Discharge instructions provided to patient. Patient verbalized understanding. Red flag signs reviewed by RN. Patient discharged home with her husband.

## 2021-07-26 ENCOUNTER — Inpatient Hospital Stay
Admission: EM | Admit: 2021-07-26 | Discharge: 2021-07-28 | DRG: 798 | Disposition: A | Discharge status: Home / Self Care | Payer: Managed Care, Other (non HMO) | Attending: Obstetrics and Gynecology | Admitting: Obstetrics and Gynecology

## 2021-07-26 ENCOUNTER — Other Ambulatory Visit: Payer: Self-pay

## 2021-07-26 ENCOUNTER — Encounter: Payer: Self-pay | Admitting: Obstetrics and Gynecology

## 2021-07-26 DIAGNOSIS — Z87891 Personal history of nicotine dependence: Secondary | ICD-10-CM | POA: Diagnosis not present

## 2021-07-26 DIAGNOSIS — O99213 Obesity complicating pregnancy, third trimester: Secondary | ICD-10-CM | POA: Diagnosis present

## 2021-07-26 DIAGNOSIS — O4290 Premature rupture of membranes, unspecified as to length of time between rupture and onset of labor, unspecified weeks of gestation: Secondary | ICD-10-CM | POA: Diagnosis present

## 2021-07-26 DIAGNOSIS — O4292 Full-term premature rupture of membranes, unspecified as to length of time between rupture and onset of labor: Principal | ICD-10-CM | POA: Diagnosis present

## 2021-07-26 DIAGNOSIS — O99824 Streptococcus B carrier state complicating childbirth: Secondary | ICD-10-CM | POA: Diagnosis present

## 2021-07-26 DIAGNOSIS — Z20822 Contact with and (suspected) exposure to covid-19: Secondary | ICD-10-CM | POA: Diagnosis present

## 2021-07-26 DIAGNOSIS — Z885 Allergy status to narcotic agent status: Secondary | ICD-10-CM

## 2021-07-26 DIAGNOSIS — F419 Anxiety disorder, unspecified: Secondary | ICD-10-CM | POA: Diagnosis present

## 2021-07-26 DIAGNOSIS — O9902 Anemia complicating childbirth: Secondary | ICD-10-CM | POA: Diagnosis present

## 2021-07-26 DIAGNOSIS — Z302 Encounter for sterilization: Secondary | ICD-10-CM | POA: Diagnosis not present

## 2021-07-26 DIAGNOSIS — E0590 Thyrotoxicosis, unspecified without thyrotoxic crisis or storm: Secondary | ICD-10-CM | POA: Diagnosis present

## 2021-07-26 DIAGNOSIS — O99344 Other mental disorders complicating childbirth: Secondary | ICD-10-CM | POA: Diagnosis present

## 2021-07-26 DIAGNOSIS — O134 Gestational [pregnancy-induced] hypertension without significant proteinuria, complicating childbirth: Secondary | ICD-10-CM | POA: Diagnosis present

## 2021-07-26 DIAGNOSIS — O4202 Full-term premature rupture of membranes, onset of labor within 24 hours of rupture: Secondary | ICD-10-CM | POA: Diagnosis not present

## 2021-07-26 DIAGNOSIS — O99214 Obesity complicating childbirth: Secondary | ICD-10-CM | POA: Diagnosis present

## 2021-07-26 DIAGNOSIS — O9882 Other maternal infectious and parasitic diseases complicating childbirth: Secondary | ICD-10-CM | POA: Diagnosis not present

## 2021-07-26 DIAGNOSIS — O24410 Gestational diabetes mellitus in pregnancy, diet controlled: Secondary | ICD-10-CM | POA: Diagnosis not present

## 2021-07-26 DIAGNOSIS — B955 Unspecified streptococcus as the cause of diseases classified elsewhere: Secondary | ICD-10-CM | POA: Diagnosis not present

## 2021-07-26 DIAGNOSIS — Z3A38 38 weeks gestation of pregnancy: Secondary | ICD-10-CM

## 2021-07-26 DIAGNOSIS — O24420 Gestational diabetes mellitus in childbirth, diet controlled: Secondary | ICD-10-CM | POA: Diagnosis present

## 2021-07-26 DIAGNOSIS — O99340 Other mental disorders complicating pregnancy, unspecified trimester: Secondary | ICD-10-CM | POA: Diagnosis present

## 2021-07-26 DIAGNOSIS — O26893 Other specified pregnancy related conditions, third trimester: Secondary | ICD-10-CM | POA: Diagnosis present

## 2021-07-26 DIAGNOSIS — O09299 Supervision of pregnancy with other poor reproductive or obstetric history, unspecified trimester: Secondary | ICD-10-CM

## 2021-07-26 LAB — RESP PANEL BY RT-PCR (FLU A&B, COVID) ARPGX2
Influenza A by PCR: NEGATIVE
Influenza B by PCR: NEGATIVE
SARS Coronavirus 2 by RT PCR: NEGATIVE

## 2021-07-26 LAB — TYPE AND SCREEN
ABO/RH(D): O POS
Antibody Screen: NEGATIVE

## 2021-07-26 LAB — CBC
HCT: 34.2 % — ABNORMAL LOW (ref 36.0–46.0)
Hemoglobin: 11.1 g/dL — ABNORMAL LOW (ref 12.0–15.0)
MCH: 27.9 pg (ref 26.0–34.0)
MCHC: 32.5 g/dL (ref 30.0–36.0)
MCV: 85.9 fL (ref 80.0–100.0)
Platelets: 174 10*3/uL (ref 150–400)
RBC: 3.98 MIL/uL (ref 3.87–5.11)
RDW: 13.8 % (ref 11.5–15.5)
WBC: 7.7 10*3/uL (ref 4.0–10.5)
nRBC: 0.3 % — ABNORMAL HIGH (ref 0.0–0.2)

## 2021-07-26 LAB — COMPREHENSIVE METABOLIC PANEL
ALT: 46 U/L — ABNORMAL HIGH (ref 0–44)
AST: 28 U/L (ref 15–41)
Albumin: 2.9 g/dL — ABNORMAL LOW (ref 3.5–5.0)
Alkaline Phosphatase: 176 U/L — ABNORMAL HIGH (ref 38–126)
Anion gap: 8 (ref 5–15)
BUN: 7 mg/dL (ref 6–20)
CO2: 21 mmol/L — ABNORMAL LOW (ref 22–32)
Calcium: 9.8 mg/dL (ref 8.9–10.3)
Chloride: 106 mmol/L (ref 98–111)
Creatinine, Ser: 0.51 mg/dL (ref 0.44–1.00)
GFR, Estimated: >60 mL/min (ref >60)
Glucose, Bld: 90 mg/dL (ref 70–99)
Potassium: 3.8 mmol/L (ref 3.5–5.1)
Sodium: 135 mmol/L (ref 135–145)
Total Bilirubin: 0.6 mg/dL (ref 0.3–1.2)
Total Protein: 6.1 g/dL — ABNORMAL LOW (ref 6.5–8.1)

## 2021-07-26 LAB — PROTEIN / CREATININE RATIO, URINE
Creatinine, Urine: 68 mg/dL
Protein Creatinine Ratio: 0.26 mg/mg{Cre} — ABNORMAL HIGH (ref 0.00–0.15)
Total Protein, Urine: 18 mg/dL

## 2021-07-26 LAB — GLUCOSE, CAPILLARY
Glucose-Capillary: 91 mg/dL (ref 70–99)
Glucose-Capillary: 71 mg/dL (ref 70–99)
Glucose-Capillary: 63 mg/dL — ABNORMAL LOW (ref 70–99)
Glucose-Capillary: 101 mg/dL — ABNORMAL HIGH (ref 70–99)

## 2021-07-26 LAB — RUPTURE OF MEMBRANE (ROM)PLUS: Rom Plus: NEGATIVE

## 2021-07-26 MED ORDER — MISOPROSTOL 50MCG HALF TABLET
50.0000 ug | ORAL_TABLET | ORAL | Status: DC | PRN
  Administered 2021-07-26 – 2021-07-27 (×2): 50 ug via VAGINAL
  Filled 2021-07-26 (×3): qty 1

## 2021-07-26 MED ORDER — LACTATED RINGERS IV BOLUS
1000.0000 mL | Freq: Once | INTRAVENOUS | Status: AC
  Administered 2021-07-26: 1000 mL via INTRAVENOUS

## 2021-07-26 MED ORDER — LACTATED RINGERS IV SOLN
125.0000 mL/h | INTRAVENOUS | Status: DC
  Administered 2021-07-26 – 2021-07-27 (×3): 125 mL/h via INTRAVENOUS

## 2021-07-26 MED ORDER — SODIUM CHLORIDE 0.9 % IV SOLN
1.0000 g | INTRAVENOUS | Status: DC
  Administered 2021-07-26 – 2021-07-27 (×4): 1 g via INTRAVENOUS
  Filled 2021-07-26 (×4): qty 1000

## 2021-07-26 MED ORDER — ONDANSETRON HCL 4 MG/2ML IJ SOLN: 4.0000 mg | Freq: Four times a day (QID) | INTRAMUSCULAR | Status: DC | PRN

## 2021-07-26 MED ORDER — OXYCODONE-ACETAMINOPHEN 5-325 MG PO TABS
2.0000 | ORAL_TABLET | ORAL | Status: DC | PRN
Start: 2021-07-26 — End: 2021-07-27

## 2021-07-26 MED ORDER — OXYTOCIN-SODIUM CHLORIDE 30-0.9 UT/500ML-% IV SOLN
1.0000 m[IU]/min | INTRAVENOUS | Status: DC
  Administered 2021-07-26: 4 m[IU]/min via INTRAVENOUS

## 2021-07-26 MED ORDER — LIDOCAINE HCL (PF) 1 % IJ SOLN: 30.0000 mL | INTRAMUSCULAR | Status: DC | PRN

## 2021-07-26 MED ORDER — AMMONIA AROMATIC IN INHA
RESPIRATORY_TRACT | Status: AC
  Filled 2021-07-26: qty 10

## 2021-07-26 MED ORDER — LIDOCAINE HCL (PF) 1 % IJ SOLN
INTRAMUSCULAR | Status: AC
  Filled 2021-07-26: qty 30

## 2021-07-26 MED ORDER — TERBUTALINE SULFATE 1 MG/ML IJ SOLN: 0.2500 mg | Freq: Once | INTRAMUSCULAR | Status: DC | PRN

## 2021-07-26 MED ORDER — ACETAMINOPHEN 325 MG PO TABS: 650.0000 mg | ORAL_TABLET | ORAL | Status: DC | PRN

## 2021-07-26 MED ORDER — MISOPROSTOL 200 MCG PO TABS
ORAL_TABLET | ORAL | Status: AC
  Filled 2021-07-26: qty 4

## 2021-07-26 MED ORDER — OXYTOCIN-SODIUM CHLORIDE 30-0.9 UT/500ML-% IV SOLN
2.5000 [IU]/h | INTRAVENOUS | Status: DC
  Filled 2021-07-26: qty 1000

## 2021-07-26 MED ORDER — OXYTOCIN BOLUS FROM INFUSION
333.0000 mL | Freq: Once | INTRAVENOUS | Status: DC
  Administered 2021-07-27: 333 mL via INTRAVENOUS

## 2021-07-26 MED ORDER — OXYTOCIN 10 UNIT/ML IJ SOLN
INTRAMUSCULAR | Status: AC
  Filled 2021-07-26: qty 2

## 2021-07-26 MED ORDER — OXYCODONE-ACETAMINOPHEN 5-325 MG PO TABS: 1.0000 | ORAL_TABLET | ORAL | Status: DC | PRN

## 2021-07-26 MED ORDER — SODIUM CHLORIDE 0.9 % IV SOLN
2.0000 g | Freq: Once | INTRAVENOUS | Status: AC
  Administered 2021-07-26: 2 g via INTRAVENOUS
  Filled 2021-07-26: qty 2000

## 2021-07-26 MED ORDER — SOD CITRATE-CITRIC ACID 500-334 MG/5ML PO SOLN: 30.0000 mL | ORAL | Status: DC | PRN

## 2021-07-26 MED ORDER — LACTATED RINGERS IV SOLN: 500.0000 mL | INTRAVENOUS | Status: DC | PRN

## 2021-07-26 MED ORDER — BUTORPHANOL TARTRATE 1 MG/ML IJ SOLN: 1.0000 mg | INTRAMUSCULAR | Status: DC | PRN

## 2021-07-26 NOTE — H&P (Signed)
Obstetric History and Physical  Jennifer Best is a 30 y.o. [phone] with IUP at 15w5dpresenting for complaints of leaking fluid since ~ 1020 this morning, clear fluid.  Does note intercourse earlier this morning. Patient states she has been having  irregular mild contractions, no vaginal bleeding, with active fetal movement.    Of note, patient was scheduled for IOL on 9/7 for obesity in pregnancy (BMI 43).  Pregnancy also complicated by GDM (diet controlled), Anxiety, h/o asthma, and subclinical hyperthyroidism.   Prenatal Course Source of Care: [facility] with onset of care at [facility] weeks Pregnancy complications or risks: Patient Active Problem List   Diagnosis Date Noted   PROM (premature rupture of membranes) 07/26/2021   Irregular uterine contractions 07/25/2021   Obesity in pregnancy, antepartum, third trimester    Pregnancy 07/18/2021   Diet controlled gestational diabetes mellitus (GDM) in third trimester 06/03/2021   Subclinical hyperthyroidism 02/23/2021   History of gestational diabetes in prior pregnancy, currently pregnant 01/27/2021   Anxiety in pregnancy, antepartum 01/27/2021   Bronchitis 08/08/2017   Encounter to establish care 08/08/2017   Obesity (BMI 35.0-39.9 without comorbidity) 12/30/2016   History of ectopic pregnancy 06/30/2016   She plans to breastfeed She desires bilateral tubal ligation for postpartum contraception.   Prenatal labs and studies: ABO, Rh: --/--/O POS (08/30 1240) Antibody: NEG (08/30 1240) Rubella: <0.90 (02/07 1542) RPR: Non Reactive (06/16 0929)  HBsAg: Negative (02/07 1542)  HIV: Non Reactive (02/07 1542)  GAVW:UJWJXBJY/- (08/12 1109) 1 hr Glucola  abnormal (183). Declined 3 hr GTT, performed CBG testing. Genetic screening normal (MaterniT21) Anatomy [employer]   Past Medical History:  Diagnosis Date   Asthma    Gestational diabetes    Obesity (BMI 30-39.9)     Past Surgical History:  Procedure  Laterality Date   CHOLECYSTECTOMY     LAPAROSCOPY Left 07/17/2016   Procedure: LAPAROSCOPY DIAGNOSTIC;  Surgeon: ARubie Maid MD;  Location: [facility] ORS;  Service: Gynecology;  Laterality: Left;  removal ectopic pregnancy   UNILATERAL SALPINGECTOMY Left 07/17/2016   Procedure: UNILATERAL SALPINGECTOMY;  Surgeon: ARubie Maid MD;  Location: [facility] ORS;  Service: Gynecology;  Laterality: Left;    OB History  Gravida Para Term Preterm AB Living  6 3 3   2 3   SAB IAB Ectopic Multiple Live Births  1   1 0 3    # Outcome Date GA Lbr Len/2nd Weight Sex Delivery Anes PTL Lv  6 Current           5 SAB 2020          4 Term 07/02/17 [id number] g M Vag-Spont None  LIV  3 Ectopic 2017          2 Term 2015 [id number] g F Vag-Spont   LIV  1 Term 2012 [id number] g F Vag-Spont   LIV    Social History   Socioeconomic History   Marital status: Married    Spouse name: Not on file   Number of children: Not on file   Years of education: Not on file   Highest education level: Not on file  Occupational History   Not on file  Tobacco Use   Smoking status: Former   Smokeless tobacco: Former   Tobacco comments:    1 cigarette per day - on and off x 2 years  Vaping Use   Vaping Use: Never used  Substance and Sexual Activity   Alcohol  use: Not Currently    Comment: occass   Drug use: No   Sexual activity: Yes    Partners: Male, Female    Comment: BTL  Other Topics Concern   Not on file  Social History Narrative   Not on file   Social Determinants of Health   Financial Resource Strain: Not on file  Food Insecurity: Not on file  Transportation Needs: Not on file  Physical Activity: Not on file  Stress: Not on file  Social Connections: Not on file    Family History  Problem Relation Age of Onset   Lung cancer Mother    Breast cancer Maternal Grandmother    Lung cancer Paternal Grandmother    Prostate cancer Paternal Grandfather    Diabetes Paternal Grandfather    Diabetes Maternal  Grandfather    Ovarian cancer Neg Hx    Colon cancer Neg Hx     Medications Prior to Admission  Medication Sig Dispense Refill Last Dose   aspirin EC 81 MG tablet Take 1 tablet (81 mg total) by mouth daily. Take after 12 weeks for prevention of preeclampssia later in pregnancy 300 tablet 2 07/25/2021   Fexofenadine HCl (ALLERGY 24-HR PO) Take 1 tablet by mouth daily.   07/25/2021   Prenatal MV & Min w/FA-DHA (PRENATAL GUMMIES PO) Take by mouth.   07/25/2021   sertraline (ZOLOFT) 50 MG tablet Take 1 tablet (50 mg total) by mouth daily. 90 tablet 3 07/25/2021   ACCU-CHEK GUIDE test strip USE 4 TIMES A DAY 100 strip 4    blood glucose meter kit and supplies KIT Dispense based on patient and insurance preference. Use up to four times daily as directed. 1 each 0     Allergies  Allergen Reactions   Morphine And Related Other (See Comments)    Makes "veins stand up"   Latex Rash and Other (See Comments)    Watery eyes    Review of Systems: Negative except for what is mentioned in HPI.  Physical Exam: BP (!) 148/87   Pulse 89   Temp 98 F (36.7 C) (Oral)   Resp 17   Ht 5' 5"  (1.651 m)   Wt 119 kg   LMP  (LMP Unknown)   BMI 43.66 kg/m  CONSTITUTIONAL: Well-developed, well-nourished female in no acute distress.  HENT:  Normocephalic, atraumatic, External right and left ear normal. Oropharynx is clear and moist EYES: Conjunctivae and EOM are normal. Pupils are equal, round, and reactive to light. No scleral icterus.  NECK: Normal range of motion, supple, no masses SKIN: Skin is warm and dry. No rash noted. Not diaphoretic. No erythema. No pallor. NEUROLOGIC: Alert and oriented to person, place, and time. Normal reflexes, muscle tone coordination. No cranial nerve deficit noted. PSYCHIATRIC: Normal mood and affect. Normal behavior. Normal judgment and thought content. CARDIOVASCULAR: Normal heart rate noted, regular rhythm RESPIRATORY: Effort and breath sounds normal, no problems with  respiration noted ABDOMEN: Soft, nontender, nondistended, gravid. MUSCULOSKELETAL: Normal range of motion. No edema and no tenderness. 2+ distal pulses.  Cervical Exam: Dilatation 2.5cm   Effacement 70%   Station -3. Grossly ruptured, clear fluid on pad.  Presentation: cephalic FHT:  Baseline rate 145 bpm   Variability moderate.  Accelerations present .  Decelerations none Contractions: Every 2-5 mins   Pertinent Labs/Studies:   Results for orders placed or performed during the hospital encounter of 07/26/21 (from the past 24 hour(s))  ROM Plus ([facility] only)     Status: None  Collection Time: 07/26/21 11:42 AM  Result Value Ref Range   Rom Plus NEGATIVE   Resp Panel by RT-PCR (Flu A&B, Covid) Nasopharyngeal Swab     Status: None   Collection Time: 07/26/21 11:42 AM   Specimen: Nasopharyngeal Swab; Nasopharyngeal(NP) swabs in vial transport medium  Result Value Ref Range   SARS Coronavirus 2 by RT PCR NEGATIVE NEGATIVE   Influenza A by PCR NEGATIVE NEGATIVE   Influenza B by PCR NEGATIVE NEGATIVE  CBC     Status: Abnormal   Collection Time: 07/26/21 12:40 PM  Result Value Ref Range   WBC 7.7 4.0 - 10.5 K/uL   RBC 3.98 3.87 - 5.11 MIL/uL   Hemoglobin 11.1 (L) 12.0 - 15.0 g/dL   HCT 34.2 (L) 36.0 - 46.0 %   MCV 85.9 80.0 - 100.0 fL   MCH 27.9 26.0 - 34.0 pg   MCHC 32.5 30.0 - 36.0 g/dL   RDW 13.8 11.5 - 15.5 %   Platelets 174 150 - 400 K/uL   nRBC 0.3 (H) 0.0 - 0.2 %  Type and screen [facility]     Status: None   Collection Time: 07/26/21 12:40 PM  Result Value Ref Range   ABO/RH(D) O POS    Antibody Screen NEG    Sample Expiration      07/29/2021,2359 Performed at [facility], [address] [place], [place] [address]   Glucose, capillary     Status: None   Collection Time: 07/26/21 12:47 PM  Result Value Ref Range   Glucose-Capillary 91 70 - 99 mg/dL    Assessment : Zaineb Silverthorne is a 30 y.o. [id number] at 70w5dbeing admitted for PROM.   Pregnancy complicated by GDM Class A1, anxiety, subclinical hyperthyroidism, asthma, and obesity. GBS positive status.   Plan: Labor: Expectant management.  Induction/Augmentation as needed with Pitocin as per protocol. Analgesia as needed.  Monitor blood sugars q 4 hrs.  FWB: Reassuring fetal heart tracing.  GBS positive, will start Ampicillin.  Delivery plan: Hopeful for vaginal delivery    CRubie Maid MD [facility]

## 2021-07-26 NOTE — Progress Notes (Signed)
ROB: Patient reports worsening Braxton Hicks, contractions.  Discussed IOL again, scheduled for 9/7 (however patient concerned regarding risk of spontaneous labor as her husband's job requires him to work out of town at times). Reviewed blood sugars, 2 elevated postprandial dinners noted, 1 elevated fasting.  NST performed today was reviewed and was found to be reactive.  Continue recommended antenatal testing and prenatal care.   NONSTRESS TEST INTERPRETATION  INDICATIONS: Gestational DM (Class A1) and Obesity  FHR baseline: 145 bpm RESULTS:Reactive COMMENTS: Irregular contractions with uterine irritability.    PLAN: 1. Continue fetal kick counts twice a day. 2. Continue antepartum testing as scheduled-weekly

## 2021-07-26 NOTE — Addendum Note (Signed)
Addended by: Fabian November on: 07/26/2021 01:58 PM   Modules accepted: Level of Service

## 2021-07-26 NOTE — OB Triage Note (Signed)
Pt G6P3 at [redacted]w[redacted]d presents for LOF. Pt reports around 1020 she went to the bathroom and peed and when she told up to take a step she had a "gush" of clear watery fluid. Pt has had scant bleeding when she wipes. Pt reports CTX here and there. +FM. Monitors applied.

## 2021-07-26 NOTE — Progress Notes (Signed)
Pt CBG reading 63, RN provided pt with apple juice. Will recheck in .

## 2021-07-26 NOTE — Progress Notes (Signed)
Intrapartum Progress Note  S: Patient notes she is beginning to feel her contractions.   O:  Vitals:   07/26/21 1221 07/26/21 1236 07/26/21 1503 07/26/21 1650  BP: (!) 148/75 (!) 148/87 133/82 (!) 146/88  Pulse: 86 89 95 94  Resp:      Temp:   98.4 F (36.9 C) 98.3 F (36.8 C)  TempSrc:   Oral Oral  Weight:      Height:        Gen App: NAD, mildly comfortable with contractions Abdomen: soft, gravid FHT: baseline 140 bpm.  Accels present.  Decels absent. Moderate variability.   Tocometer: contractions q 2-4 minutes, irregular Cervix: Dilation: 2.5 Effacement (%): 60, 70 Station: -3 Exam by:: J lunsford RN  Extremities: Nontender, no edema.  Pitocin: 12 mIU  Labs:  No new labs  Assessment:  1: SIUP at [redacted]w[redacted]d 2. GDM (diet) 3. Obesity in pregnancy 4. Elevated blood pressures  Plan:  1. Continue IOL with Pitocin. Cervix unchanged after initial check.   2. GDM , continue q 4 hr glucose checkes.  3. Patient with several elevated BPs, review of chart notes that over past week of triage visits, she has also had an occasional BP elevation. Had baseline PIH labs performed last week. Will repeat. If negative, likely with some gestational HTN at term. If pressures become severe range, will need to treat.    Hildred Laser, MD 07/26/2021 5:33 PM

## 2021-07-27 ENCOUNTER — Encounter: Payer: Managed Care, Other (non HMO) | Admitting: Obstetrics and Gynecology

## 2021-07-27 ENCOUNTER — Other Ambulatory Visit: Payer: Managed Care, Other (non HMO)

## 2021-07-27 ENCOUNTER — Encounter: Payer: Self-pay | Admitting: Obstetrics and Gynecology

## 2021-07-27 DIAGNOSIS — O9882 Other maternal infectious and parasitic diseases complicating childbirth: Secondary | ICD-10-CM

## 2021-07-27 DIAGNOSIS — F419 Anxiety disorder, unspecified: Secondary | ICD-10-CM

## 2021-07-27 DIAGNOSIS — Z3A38 38 weeks gestation of pregnancy: Secondary | ICD-10-CM

## 2021-07-27 DIAGNOSIS — O99344 Other mental disorders complicating childbirth: Secondary | ICD-10-CM | POA: Diagnosis not present

## 2021-07-27 DIAGNOSIS — O24410 Gestational diabetes mellitus in pregnancy, diet controlled: Secondary | ICD-10-CM

## 2021-07-27 DIAGNOSIS — O99213 Obesity complicating pregnancy, third trimester: Secondary | ICD-10-CM

## 2021-07-27 DIAGNOSIS — E0590 Thyrotoxicosis, unspecified without thyrotoxic crisis or storm: Secondary | ICD-10-CM

## 2021-07-27 DIAGNOSIS — O4202 Full-term premature rupture of membranes, onset of labor within 24 hours of rupture: Secondary | ICD-10-CM | POA: Diagnosis not present

## 2021-07-27 DIAGNOSIS — B955 Unspecified streptococcus as the cause of diseases classified elsewhere: Secondary | ICD-10-CM

## 2021-07-27 LAB — RPR: RPR Ser Ql: NONREACTIVE

## 2021-07-27 LAB — GLUCOSE, CAPILLARY
Glucose-Capillary: 82 mg/dL (ref 70–99)
Glucose-Capillary: 79 mg/dL (ref 70–99)

## 2021-07-27 MED ORDER — WITCH HAZEL-GLYCERIN EX PADS: 1.0000 "application " | MEDICATED_PAD | CUTANEOUS | Status: DC | PRN

## 2021-07-27 MED ORDER — PRENATAL MULTIVITAMIN CH
1.0000 | ORAL_TABLET | Freq: Every day | ORAL | Status: DC
  Administered 2021-07-27: 1 via ORAL
  Filled 2021-07-27: qty 1

## 2021-07-27 MED ORDER — IBUPROFEN 600 MG PO TABS
600.0000 mg | ORAL_TABLET | Freq: Four times a day (QID) | ORAL | Status: DC
  Administered 2021-07-27 – 2021-07-28 (×4): 600 mg via ORAL
  Filled 2021-07-27 (×4): qty 1

## 2021-07-27 MED ORDER — DIBUCAINE (PERIANAL) 1 % EX OINT: 1.0000 "application " | TOPICAL_OINTMENT | CUTANEOUS | Status: DC | PRN

## 2021-07-27 MED ORDER — COCONUT OIL OIL
1.0000 "application " | TOPICAL_OIL | Status: DC | PRN
  Filled 2021-07-27: qty 120

## 2021-07-27 MED ORDER — METOCLOPRAMIDE HCL 10 MG PO TABS
10.0000 mg | ORAL_TABLET | Freq: Once | ORAL | Status: AC
  Administered 2021-07-28: 10 mg via ORAL
  Filled 2021-07-27 (×2): qty 1

## 2021-07-27 MED ORDER — DIPHENHYDRAMINE HCL 25 MG PO CAPS: 25.0000 mg | ORAL_CAPSULE | Freq: Four times a day (QID) | ORAL | Status: DC | PRN

## 2021-07-27 MED ORDER — LACTATED RINGERS IV SOLN: INTRAVENOUS | Status: DC

## 2021-07-27 MED ORDER — MISOPROSTOL 200 MCG PO TABS
800.0000 ug | ORAL_TABLET | Freq: Once | ORAL | Status: AC
  Administered 2021-07-27: 800 ug via ORAL
  Filled 2021-07-27: qty 4

## 2021-07-27 MED ORDER — BENZOCAINE-MENTHOL 20-0.5 % EX AERO: 1.0000 "application " | INHALATION_SPRAY | CUTANEOUS | Status: DC | PRN

## 2021-07-27 MED ORDER — SIMETHICONE 80 MG PO CHEW: 80.0000 mg | CHEWABLE_TABLET | ORAL | Status: DC | PRN

## 2021-07-27 MED ORDER — FAMOTIDINE 20 MG PO TABS
40.0000 mg | ORAL_TABLET | Freq: Once | ORAL | Status: AC
  Administered 2021-07-28: 40 mg via ORAL

## 2021-07-27 MED ORDER — MISOPROSTOL 50MCG HALF TABLET
50.0000 ug | ORAL_TABLET | ORAL | Status: DC
  Administered 2021-07-27: 50 ug via ORAL

## 2021-07-27 MED ORDER — ACETAMINOPHEN 325 MG PO TABS
650.0000 mg | ORAL_TABLET | ORAL | Status: DC | PRN
  Administered 2021-07-27: 650 mg via ORAL
  Filled 2021-07-27: qty 2

## 2021-07-27 MED ORDER — SENNOSIDES-DOCUSATE SODIUM 8.6-50 MG PO TABS
2.0000 | ORAL_TABLET | Freq: Every day | ORAL | Status: DC
  Administered 2021-07-28: 2 via ORAL
  Filled 2021-07-27: qty 2

## 2021-07-27 MED ORDER — ONDANSETRON HCL 4 MG/2ML IJ SOLN: 4.0000 mg | INTRAMUSCULAR | Status: DC | PRN

## 2021-07-27 MED ORDER — SERTRALINE HCL 25 MG PO TABS
50.0000 mg | ORAL_TABLET | Freq: Every day | ORAL | Status: DC
  Administered 2021-07-27 – 2021-07-28 (×2): 50 mg via ORAL
  Filled 2021-07-27: qty 1
  Filled 2021-07-27 (×2): qty 2

## 2021-07-27 MED ORDER — ONDANSETRON HCL 4 MG PO TABS
4.0000 mg | ORAL_TABLET | ORAL | Status: DC | PRN
  Filled 2021-07-27: qty 1

## 2021-07-27 MED ORDER — ZOLPIDEM TARTRATE 5 MG PO TABS: 5.0000 mg | ORAL_TABLET | Freq: Every evening | ORAL | Status: DC | PRN

## 2021-07-27 NOTE — Consult Note (Signed)
Brief Pharmacy Note  Pharmacy has been consulted for our anti-hypertensive meds to beds program.   Patient is currently not on any anti-hypertensive medications and does not qualify for meds-to-beds at this time.   Please re-consult pharmacy for meds-to-beds if this changes.   Thank you for including pharmacy in this patient's care.  Ronnald Ramp, PharmD,  Clinical Pharmacist 07/27/2021 10:12 AM

## 2021-07-27 NOTE — Progress Notes (Signed)
Intrapartum Progress Note  S: Patient notes she is feeling some mild contractions.   O: Blood pressure 139/84, pulse 97, temperature 98.5 F (36.9 C), temperature source Oral, resp. rate 18, height 5\' 5"  (1.651 m), weight 119 kg. Gen App: NAD, comfortable Abdomen: soft, gravid FHT: baseline 150 bpm.  Accels present.  Decels absent. Moderate variability.   Tocometer: contractions q 3-5 minutes Cervix: Dilation: 3 Effacement (%): 50 Cervical Position: Middle Station: -3 Presentation: Vertex Exam by:: [email or web address] RN  Extremities: Nontender, no edema.  Pitocin: held.  Started Cytotec, received 1st dose at 8:30 pm.   Labs:  Results for ARTINA, MINELLA (MRN Linard Millers) as of 07/27/2021 02:15  Ref. Range 07/26/2021 16:51 07/26/2021 18:45 07/26/2021 21:12 07/26/2021 21:37  Glucose-Capillary Latest Ref Range: 70 - 99 mg/dL 71  63 (L) 07/28/2021 (H)    Results for orders placed or performed during the hospital encounter of 07/26/21  CBC  Result Value Ref Range   WBC 7.7 4.0 - 10.5 K/uL   RBC 3.98 3.87 - 5.11 MIL/uL   Hemoglobin 11.1 (L) 12.0 - 15.0 g/dL   HCT 07/28/21 (L) 86.7 - 67.2 %   MCV 85.9 80.0 - 100.0 fL   MCH 27.9 26.0 - 34.0 pg   MCHC 32.5 30.0 - 36.0 g/dL   RDW 09.4 70.9 - 62.8 %   Platelets 174 150 - 400 K/uL   nRBC 0.3 (H) 0.0 - 0.2 %  Comprehensive metabolic panel  Result Value Ref Range   Sodium 135 135 - 145 mmol/L   Potassium 3.8 3.5 - 5.1 mmol/L   Chloride 106 98 - 111 mmol/L   CO2 21 (L) 22 - 32 mmol/L   Glucose, Bld 90 70 - 99 mg/dL   BUN 7 6 - 20 mg/dL   Creatinine, Ser 36.6 0.44 - 1.00 mg/dL   Calcium 9.8 8.9 - 2.94 mg/dL   Total Protein 6.1 (L) 6.5 - 8.1 g/dL   Albumin 2.9 (L) 3.5 - 5.0 g/dL   AST 28 15 - 41 U/L   ALT 46 (H) 0 - 44 U/L   Alkaline Phosphatase 176 (H) 38 - 126 U/L   Total Bilirubin 0.6 0.3 - 1.2 mg/dL   GFR, Estimated 76.5 >46 mL/min   Anion gap 8 5 - 15  Protein / creatinine ratio, urine  Result Value Ref Range   Creatinine, Urine 68 mg/dL   Total  Protein, Urine 18 mg/dL   Protein Creatinine Ratio 0.26 (H) 0.00 - 0.15 mg/mg[Cre]     Assessment:  1: SIUP at [redacted]w[redacted]d 2. PROM 3. GDM 4. Elevated BPs 5. GBS positive  Plan:  1. Continue IOL. Stopped Pitocin as patient with regression of cervical change, less effacement noted on recent exam.  Pitocin reached 20 mIU. Will restart induction with Cytotec for cervical ripening. Administered first dose of 50 mcg ~ 1 hr ago.  2. PROM, continue to monitor. Is receiving antibiotics (ampicillin) for GBS positive status.  3. GDM, continue glucose check s q 4 hrs. All have been wnl.  4. Elevated BPs, labile.  No need for treatment at this time as none have been severe range. Continue to monitor. ALT was borderline elevated but all other PIH labs normal.   [redacted]w[redacted]d, MD 07/27/2021 2:08 AM

## 2021-07-27 NOTE — Progress Notes (Signed)
Intrapartum Progress Note  S: Patient tired, but otherwise doing ok.    O: Blood pressure 129/60, pulse 75, temperature 98.1 F (36.7 C), temperature source Oral, resp. rate 18, height 5\' 5"  (1.651 m), weight 119 kg.  Gen App: NAD, mildly comfortable Abdomen: soft, gravid FHT: baseline 150 bpm.  Accels present.  Decels absent. Moderate variability.   Tocometer: contractions q 2-5 minutes Cervix: Dilation: 5 Effacement (%): 70 Cervical Position: Middle Station: -3 Presentation: Vertex Exam by:: [email or web address] RN  Extremities: Nontender, no edema.  Pitocin: 4 mIU  Labs:  Results for PRETTY, WELTMAN (MRN Linard Millers) as of 07/27/2021 06:55  Ref. Range 07/27/2021 05:16  Glucose-Capillary Latest Ref Range: 70 - 99 mg/dL 79    Assessment:  1: SIUP at [redacted]w[redacted]d 2. PROM 3. GDM 4. Elevated BPs 5. GBS positive  Plan:  1. Continue IOL. Foley bulb expelled ~ 30 min after insertion.  Pitocin initiated again.    2. PROM, continue to monitor. Is receiving antibiotics (ampicillin) for GBS positive status. Has been adequately treated.  3. GDM, continue glucose check s q 4 hrs. All have been wnl.  4. BPs wnl.  No need for treatment at this time as none have been severe range. Continue to monitor.    [redacted]w[redacted]d, MD 07/27/2021 6:54 AM

## 2021-07-27 NOTE — Progress Notes (Signed)
Intrapartum Progress Note  S: Patient hurting a little more. Declines pan medications at this time.   O: Blood pressure 125/65, pulse 88, temperature 97.9 F (36.6 C), temperature source Oral, resp. rate 16, height 5\' 5"  (1.651 m), weight 119 kg.  Gen App: NAD, comfortable Abdomen: soft, gravid FHT: baseline 150 bpm.  Accels present.  Decels absent. Moderate variability.   Tocometer: contractions q 2-5 minutes Cervix: Dilation: 3 Effacement (%): 70 Cervical Position: Middle Station: -3 Presentation: Vertex Exam by:: [email or web address] RN  Extremities: Nontender, no edema.  Pitocin: None  Labs:  Results for CAROLIE, MCILRATH (MRN Linard Millers) as of 07/27/2021 02:15  Ref. Range 07/27/2021 01:09  Glucose-Capillary Latest Ref Range: 70 - 99 mg/dL 82   Assessment:  1: SIUP at [redacted]w[redacted]d 2. PROM 3. GDM 4. Elevated BPs 5. GBS positive  Plan:  1. Continue IOL. Given second dose of Cytotec, 100 mg.  Also placed Cook's catheter.  2. PROM, continue to monitor. Is receiving antibiotics (ampicillin) for GBS positive status. Has been adequately treated.  3. GDM, continue glucose check s q 4 hrs. All have been wnl.  4. Elevated BPs, labile.  No need for treatment at this time as none have been severe range. Continue to monitor.    [redacted]w[redacted]d, MD 07/27/2021 2:18 AM

## 2021-07-28 ENCOUNTER — Inpatient Hospital Stay: Payer: Managed Care, Other (non HMO) | Admitting: Anesthesiology

## 2021-07-28 ENCOUNTER — Encounter
Admission: EM | Disposition: A | Discharge status: Home / Self Care | Payer: Self-pay | Source: Home / Self Care | Attending: Obstetrics and Gynecology

## 2021-07-28 ENCOUNTER — Encounter: Payer: Self-pay | Admitting: Obstetrics and Gynecology

## 2021-07-28 DIAGNOSIS — Z302 Encounter for sterilization: Secondary | ICD-10-CM | POA: Diagnosis not present

## 2021-07-28 HISTORY — PX: TUBAL LIGATION: SHX77

## 2021-07-28 LAB — CBC
HCT: 27.3 % — ABNORMAL LOW (ref 36.0–46.0)
Hemoglobin: 8.8 g/dL — ABNORMAL LOW (ref 12.0–15.0)
MCH: 27.7 pg (ref 26.0–34.0)
MCHC: 32.2 g/dL (ref 30.0–36.0)
MCV: 85.8 fL (ref 80.0–100.0)
Platelets: 161 10*3/uL (ref 150–400)
RBC: 3.18 MIL/uL — ABNORMAL LOW (ref 3.87–5.11)
RDW: 13.8 % (ref 11.5–15.5)
WBC: 9.6 10*3/uL (ref 4.0–10.5)
nRBC: 0 % (ref 0.0–0.2)

## 2021-07-28 SURGERY — LIGATION, FALLOPIAN TUBE, POSTPARTUM
Anesthesia: General | Laterality: Bilateral

## 2021-07-28 MED ORDER — IBUPROFEN 600 MG PO TABS: 600.0000 mg | ORAL_TABLET | Freq: Four times a day (QID) | ORAL | 0 refills | Status: DC | PRN

## 2021-07-28 MED ORDER — ONDANSETRON HCL 4 MG/2ML IJ SOLN
INTRAMUSCULAR | Status: DC | PRN
  Administered 2021-07-28: 4 mg via INTRAVENOUS

## 2021-07-28 MED ORDER — DEXAMETHASONE SODIUM PHOSPHATE 10 MG/ML IJ SOLN
INTRAMUSCULAR | Status: DC | PRN
  Administered 2021-07-28: 10 mg via INTRAVENOUS

## 2021-07-28 MED ORDER — DOCUSATE SODIUM 100 MG PO CAPS: 100.0000 mg | ORAL_CAPSULE | Freq: Two times a day (BID) | ORAL | 2 refills | Status: DC | PRN

## 2021-07-28 MED ORDER — SUCCINYLCHOLINE CHLORIDE 200 MG/10ML IV SOSY
PREFILLED_SYRINGE | INTRAVENOUS | Status: DC | PRN
  Administered 2021-07-28: 200 mg via INTRAVENOUS

## 2021-07-28 MED ORDER — PROPOFOL 10 MG/ML IV BOLUS
INTRAVENOUS | Status: AC
  Filled 2021-07-28: qty 20

## 2021-07-28 MED ORDER — FENTANYL CITRATE (PF) 100 MCG/2ML IJ SOLN
INTRAMUSCULAR | Status: AC
  Administered 2021-07-28: 25 ug via INTRAVENOUS
  Filled 2021-07-28: qty 2

## 2021-07-28 MED ORDER — BUPIVACAINE HCL (PF) 0.5 % IJ SOLN
INTRAMUSCULAR | Status: AC
  Filled 2021-07-28: qty 30

## 2021-07-28 MED ORDER — SODIUM CHLORIDE 0.9 % IV SOLN: INTRAVENOUS | Status: DC | PRN

## 2021-07-28 MED ORDER — PROPOFOL 10 MG/ML IV BOLUS
INTRAVENOUS | Status: DC | PRN
  Administered 2021-07-28: 200 mg via INTRAVENOUS

## 2021-07-28 MED ORDER — ACETAMINOPHEN 10 MG/ML IV SOLN
INTRAVENOUS | Status: AC
  Filled 2021-07-28: qty 100

## 2021-07-28 MED ORDER — MIDAZOLAM HCL 2 MG/2ML IJ SOLN
INTRAMUSCULAR | Status: AC
  Filled 2021-07-28: qty 2

## 2021-07-28 MED ORDER — BUPIVACAINE HCL 0.5 % IJ SOLN
INTRAMUSCULAR | Status: DC | PRN
  Administered 2021-07-28: 20 mL

## 2021-07-28 MED ORDER — ROCURONIUM BROMIDE 100 MG/10ML IV SOLN
INTRAVENOUS | Status: DC | PRN
Start: 2021-07-28 — End: 2021-07-28
  Administered 2021-07-28 (×2): 10 mg via INTRAVENOUS

## 2021-07-28 MED ORDER — FENTANYL CITRATE (PF) 100 MCG/2ML IJ SOLN
INTRAMUSCULAR | Status: DC | PRN
  Administered 2021-07-28: 100 ug via INTRAVENOUS

## 2021-07-28 MED ORDER — FENTANYL CITRATE (PF) 100 MCG/2ML IJ SOLN
INTRAMUSCULAR | Status: AC
  Filled 2021-07-28: qty 2

## 2021-07-28 MED ORDER — LIDOCAINE HCL (CARDIAC) PF 100 MG/5ML IV SOSY
PREFILLED_SYRINGE | INTRAVENOUS | Status: DC | PRN
  Administered 2021-07-28: 100 mg via INTRAVENOUS

## 2021-07-28 MED ORDER — FERROUS SULFATE 325 (65 FE) MG PO TABS: 325.0000 mg | ORAL_TABLET | Freq: Two times a day (BID) | ORAL | 1 refills | Status: DC

## 2021-07-28 MED ORDER — DEXMEDETOMIDINE (PRECEDEX) IN NS 20 MCG/5ML (4 MCG/ML) IV SYRINGE
PREFILLED_SYRINGE | INTRAVENOUS | Status: DC | PRN
  Administered 2021-07-28: 4 ug via INTRAVENOUS
  Administered 2021-07-28: 10 ug via INTRAVENOUS
  Administered 2021-07-28: 12 ug via INTRAVENOUS
  Administered 2021-07-28: 4 ug via INTRAVENOUS

## 2021-07-28 MED ORDER — ACETAMINOPHEN 10 MG/ML IV SOLN
INTRAVENOUS | Status: DC | PRN
  Administered 2021-07-28: 1000 mg via INTRAVENOUS

## 2021-07-28 MED ORDER — OXYCODONE-ACETAMINOPHEN 5-325 MG PO TABS: 1.0000 | ORAL_TABLET | Freq: Four times a day (QID) | ORAL | Status: DC | PRN

## 2021-07-28 MED ORDER — FENTANYL CITRATE (PF) 100 MCG/2ML IJ SOLN
25.0000 ug | INTRAMUSCULAR | Status: DC | PRN
  Administered 2021-07-28: 25 ug via INTRAVENOUS
  Administered 2021-07-28: 50 ug via INTRAVENOUS

## 2021-07-28 SURGICAL SUPPLY — 27 items
BLADE SURG SZ11 CARB STEEL (BLADE) ×2 IMPLANT
CHLORAPREP W/TINT 26 (MISCELLANEOUS) ×2 IMPLANT
DERMABOND ADVANCED (GAUZE/BANDAGES/DRESSINGS) ×1
DERMABOND ADVANCED .7 DNX12 (GAUZE/BANDAGES/DRESSINGS) ×1 IMPLANT
DRAPE LAPAROTOMY 100X77 ABD (DRAPES) ×2 IMPLANT
GAUZE 4X4 16PLY ~~LOC~~+RFID DBL (SPONGE) ×2 IMPLANT
GLOVE SURG ENC MOIS LTX SZ6.5 (GLOVE) ×2 IMPLANT
GLOVE SURG UNDER LTX SZ7 (GLOVE) ×2 IMPLANT
GOWN STRL REUS W/ TWL LRG LVL3 (GOWN DISPOSABLE) ×2 IMPLANT
GOWN STRL REUS W/TWL LRG LVL3 (GOWN DISPOSABLE) ×2
KIT TURNOVER CYSTO (KITS) ×2 IMPLANT
MANIFOLD NEPTUNE II (INSTRUMENTS) ×2 IMPLANT
NEEDLE HYPO 25GX1X1/2 BEV (NEEDLE) ×2 IMPLANT
NS IRRIG 500ML POUR BTL (IV SOLUTION) ×2 IMPLANT
PACK BASIN MINOR ARMC (MISCELLANEOUS) ×2 IMPLANT
SCRUB EXIDINE 4% CHG 4OZ (MISCELLANEOUS) IMPLANT
SUT MNCRL 4-0 (SUTURE) ×1
SUT MNCRL 4-0 27XMFL (SUTURE) ×1
SUT PLAIN GUT 0 (SUTURE) ×4 IMPLANT
SUT VIC AB 0 CT1 36 (SUTURE) IMPLANT
SUT VIC AB 0 SH 27 (SUTURE) IMPLANT
SUT VIC AB 3-0 SH 27 (SUTURE) ×1
SUT VIC AB 3-0 SH 27X BRD (SUTURE) ×1 IMPLANT
SUT VICRYL 0 AB UR-6 (SUTURE) ×4 IMPLANT
SUTURE MNCRL 4-0 27XMF (SUTURE) ×1 IMPLANT
SYR 10ML LL (SYRINGE) ×2 IMPLANT
WATER STERILE IRR 500ML POUR (IV SOLUTION) ×2 IMPLANT

## 2021-07-28 NOTE — Transfer of Care (Signed)
Immediate Anesthesia Transfer of Care Note  Patient: Jennifer Best  Procedure(s) Performed: POST PARTUM TUBAL LIGATION (Bilateral)  Patient Location: PACU  Anesthesia Type:General  Level of Consciousness: awake, alert  and oriented  Airway & Oxygen Therapy: Patient Spontanous Breathing  Post-op Assessment: Report given to RN and Post -op Vital signs reviewed and stable  Post vital signs: stable  Last Vitals:  Vitals Value Taken Time  BP 131/107 07/28/21 1435  Temp 36 C 07/28/21 1435  Pulse 81 07/28/21 1438  Resp 18 07/28/21 1438  SpO2 94 % 07/28/21 1438  Vitals shown include unvalidated device data.  Last Pain:  Vitals:   07/28/21 1242  TempSrc: Oral  PainSc: 0-No pain      Patients Stated Pain Goal: 0 (07/27/21 0318)  Complications: No notable events documented.

## 2021-07-28 NOTE — Discharge Instructions (Signed)

## 2021-07-28 NOTE — Anesthesia Preprocedure Evaluation (Signed)
Anesthesia Evaluation  Patient identified by MRN, date of birth, ID band Patient awake    Reviewed: Allergy & Precautions, NPO status , Patient's Chart, lab work & pertinent test results  History of Anesthesia Complications Negative for: history of anesthetic complications  Airway Mallampati: III  TM Distance: >3 FB Neck ROM: full    Dental  (+) Chipped   Pulmonary neg shortness of breath, asthma , former smoker,    Pulmonary exam normal        Cardiovascular Exercise Tolerance: Good (-) angina(-) Past MI and (-) DOE Normal cardiovascular exam     Neuro/Psych PSYCHIATRIC DISORDERS negative neurological ROS  negative psych ROS   GI/Hepatic negative GI ROS, Neg liver ROS,   Endo/Other  diabetes, Type 2Hyperthyroidism   Renal/GU      Musculoskeletal   Abdominal   Peds  Hematology negative hematology ROS (+)   Anesthesia Other Findings Past Medical History: No date: Asthma No date: Gestational diabetes No date: Obesity (BMI 30-39.9)  Past Surgical History: No date: CHOLECYSTECTOMY 07/17/2016: LAPAROSCOPY; Left     Comment:  Procedure: LAPAROSCOPY DIAGNOSTIC;  Surgeon: Hildred Laser, MD;  Location: [facility] ORS;  Service: Gynecology;                Laterality: Left;  removal ectopic pregnancy 07/17/2016: UNILATERAL SALPINGECTOMY; Left     Comment:  Procedure: UNILATERAL SALPINGECTOMY;  Surgeon: Hildred Laser, MD;  Location: [facility] ORS;  Service: Gynecology;                Laterality: Left;  BMI    Body Mass Index: 43.66 kg/m      Reproductive/Obstetrics negative OB ROS                             Anesthesia Physical Anesthesia Plan  ASA: 3  Anesthesia Plan: General ETT   Post-op Pain Management:    Induction: Intravenous  PONV Risk Score and Plan: Ondansetron, Dexamethasone, Midazolam and Treatment may vary due to age or medical condition  Airway  Management Planned: Oral ETT  Additional Equipment:   Intra-op Plan:   Post-operative Plan: Extubation in OR  Informed Consent: I have reviewed the patients History and Physical, chart, labs and discussed the procedure including the risks, benefits and alternatives for the proposed anesthesia with the patient or authorized representative who has indicated his/her understanding and acceptance.     Dental Advisory Given  Plan Discussed with: Anesthesiologist, CRNA and Surgeon  Anesthesia Plan Comments: (Patient consented for risks of anesthesia including but not limited to:  - adverse reactions to medications - damage to eyes, teeth, lips or other oral mucosa - nerve damage due to positioning  - sore throat or hoarseness - Damage to heart, brain, nerves, lungs, other parts of body or loss of life  Patient voiced understanding.)        Anesthesia Quick Evaluation

## 2021-07-28 NOTE — TOC Initial Note (Signed)
Transition of Care [facility]) - Initial/Assessment Note    Patient Details  Name: Jennifer Best MRN: [id number] Date of Birth: [birth year]  [facility]) CM/SW Contact:    Trommald Cellar, RN Phone Number: 07/28/2021, 2:27 PM  Clinical Narrative:                  TOC aware of consult for [place] score of 9. Patient currently in OR for procedure. Will attempt to contact at a later date. No other needs or concerns per RN.        Patient Goals and CMS Choice        Expected Discharge Plan and Services                                                Prior Living Arrangements/Services                       Activities of Daily Living Home Assistive Devices/Equipment: None ADL Screening (condition at time of admission) Patient's cognitive ability adequate to safely complete daily activities?: Yes Is the patient deaf or have difficulty hearing?: No Does the patient have difficulty seeing, even when wearing glasses/contacts?: No Does the patient have difficulty concentrating, remembering, or making decisions?: No Patient able to express need for assistance with ADLs?: Yes Does the patient have difficulty dressing or bathing?: No Independently performs ADLs?: Yes (appropriate for developmental age) Does the patient have difficulty walking or climbing stairs?: No Weakness of Legs: None Weakness of Arms/Hands: None  Permission Sought/Granted                  Emotional Assessment              Admission diagnosis:  PROM (premature rupture of membranes) [O42.90] Patient Active Problem List   Diagnosis Date Noted   PROM (premature rupture of membranes) 07/26/2021   Irregular uterine contractions 07/25/2021   Obesity in pregnancy, antepartum, third trimester    Pregnancy 07/18/2021   Diet controlled gestational diabetes mellitus (GDM) in third trimester 06/03/2021   Subclinical hyperthyroidism 02/23/2021   History of gestational diabetes in  prior pregnancy, currently pregnant 01/27/2021   Anxiety in pregnancy, antepartum 01/27/2021   Bronchitis 08/08/2017   Encounter to establish care 08/08/2017   Obesity (BMI 35.0-39.9 without comorbidity) 12/30/2016   History of ectopic pregnancy 06/30/2016   PCP:  Hildred Laser, MD Pharmacy:   [phone], [place] - [address] [place] [address] Phone: [phone] Fax: [phone]     Social Determinants of Health (SDOH) Interventions    Readmission Risk Interventions No flowsheet data found.

## 2021-07-28 NOTE — Progress Notes (Signed)
Mother discharged.  Discharge instructions given.  Mother verbalizes understanding.  Transported by auxiliary.  

## 2021-07-28 NOTE — Op Note (Signed)
Procedure(s): POST PARTUM TUBAL LIGATION (Right) Procedure Note  Finn Abila female 30 y.o. 07/28/2021  Indications: The patient is a 30 y.o. [id number] female, multiparous s/p vaginal delivery, undesired fertility. Other reversible forms of contraception were discussed with patient; she declines all other modalities. Risks of procedure discussed with patient including but not limited to: risk of regret, permanence of method, bleeding, infection, injury to surrounding organs and need for additional procedures.  Failure risk of 1-2 % with increased risk of ectopic gestation if pregnancy occurs  also discussed with patient. Also discussed possibility of post-tubal syndrome with increased pelvic pain or menstrual irregularities.  Patient verbalized understanding of these risks and wants to proceed with sterilization. Written consent obtained.   Pre-operative Diagnosis: multiparity, s/p vaginal delivery, undesired fertility, morbid obesity (BMI 43). Prior history of ectopic pregnancy on left s/p salpingectomy.  Post-operative Diagnosis: Same  Surgeon: Hildred Laser, MD  Assistants:  Surgical scrub tech.   Anesthesia: General endotracheal anesthesia  Findings: Normal appearing uterine outline, right fallopian tube, and  ovaries. Left fallopian tube previously surgically removed.   Procedure Details: The patient was seen in the Holding Room. The risks, benefits, complications, treatment options, and expected outcomes were discussed with the patient.  The patient concurred with the proposed plan, giving informed consent.  The site of surgery properly noted/marked. The patient was taken to the Operating Room, identified as Marlean Macari and the procedure verified as Procedure(s) (LRB): POST PARTUM TUBAL LIGATION (Bilateral).  She was then placed under general anesthesia without difficulty. She was placed in the supine position, and was prepped and draped in a sterile manner.  After an adequate timeout  was performed, attention was turned to the patient's abdomen local analgesia was administered.  A small transverse skin incision was made under the umbilical fold. The incision was taken down to the layer of fascia using the scalpel, and fascia was incised, and extended bilaterally using Mayo scissors. The peritoneum was entered in a sharp fashion. Attention was then turned to the patient's uterus, and right  fallopian tube was identified and followed out to the fimbriated end. A.  The Babcock clamp was then used to grasp the tube approximately 4 cm from the cornual region.  A 3 cm segment of tube was then ligated with a free tie of 0-Chromic using the [place] method and excised.  Good hemostasis was noted with bilateral fallopian tubes.  The instruments were then removed from the patient's abdomen and the fascial incision was repaired with 0 Vicryl, and injected with 10 ml of 0.5% Sensorcaine.The subcutaneous tissue layer was closed with a 3-0 Vicryl.  The skin was closed with a 4-0 Vicryl subcuticular stitch. The skin was injected with an additional 10 ml of 0.5% Sensorcaine.  Dermabond was placed over the incision. The patient tolerated the procedure well.  Instrument, sponge, and needle counts were correct times three.  The patient was then taken to the recovery room awake and in stable condition.  Estimated Blood Loss:  10 ml      Drains: None. Patient voided prior to procedure.          Total IV Fluids: 100 ml  Specimens: Right fallopian tube segment         Implants: None         Complications:  None; patient tolerated the procedure well.         Disposition: PACU - hemodynamically stable.         Condition: stable   Mariesha Venturella  Valentino Saxon, MD [facility]

## 2021-07-28 NOTE — Progress Notes (Signed)
Post Partum Day # 1, s/p SVD  Subjective: Patient notes feeling tired, didn't sleep much last night. Up ad lib, voiding, and tolerating PO.  Objective: Temp:  [97.8 F (36.6 C)-99 F (37.2 C)] 97.8 F (36.6 C) (09/01 0440) Pulse Rate:  [72-104] 72 (09/01 0440) Resp:  [16-20] 16 (09/01 0440) BP: (113-152)/(70-97) 127/82 (09/01 0440) SpO2:  [96 %-100 %] 97 % (09/01 0440)  Physical Exam:  General: alert and no distress  Lungs: clear to auscultation bilaterally Breasts: normal appearance, no masses or tenderness Heart: regular rate and rhythm, S1, S2 normal, no murmur, click, rub or gallop Abdomen: soft, non-tender; bowel sounds normal; no masses,  no organomegaly Pelvis: Lochia: appropriate, Uterine Fundus: firm Extremities: DVT Evaluation: No evidence of DVT seen on physical exam. Negative Homan's sign. No cords or calf tenderness. No significant calf/ankle edema.   Recent Labs    07/26/21 1240 07/28/21 0259  HGB 11.1* 8.8*  HCT 34.2* 27.3*    Assessment/Plan: - Doing well postpartum - Breastfeeding, Lactation consult - Contraception: desires tubal ligation - Anemia of pregnancy, asymptomatic. Will treat with PO iron.  - Gestational HTN diagnosed at admission.  Had several elevated BPs yesterday, however now with downward trend to normal. Will continue to monitor. If BPs begin to elevate again, will initiate medications.  - H/o anxiety in pregnancy, continue Zoloft.  - Likely d/c home later today after BTL.    LOS: 2 days   Hildred Laser, MD [facility]

## 2021-07-28 NOTE — Anesthesia Procedure Notes (Signed)
Procedure Name: Intubation Date/Time: 07/28/2021 1:25 PM Performed by: Rodney Booze, CRNA Pre-anesthesia Checklist: Patient identified, Emergency Drugs available, Suction available and Patient being monitored Patient Re-evaluated:Patient Re-evaluated prior to induction Oxygen Delivery Method: Circle system utilized Preoxygenation: Pre-oxygenation with 100% oxygen Induction Type: IV induction, Rapid sequence and Cricoid Pressure applied Laryngoscope Size: Miller and 2 Grade View: Grade I Tube type: Oral Tube size: 7.0 mm Number of attempts: 1 Airway Equipment and Method: Stylet and Oral airway Placement Confirmation: ETT inserted through vocal cords under direct vision, positive ETCO2 and breath sounds checked- equal and bilateral Secured at: 20 cm Tube secured with: Tape Dental Injury: Teeth and Oropharynx as per pre-operative assessment

## 2021-07-28 NOTE — Progress Notes (Signed)
Mom to OR for BTL.

## 2021-07-28 NOTE — Lactation Note (Signed)
This note was copied from a baby's chart. Lactation Consultation Note  Patient Name: Girl Consetta Kemnitz CHENI'D Date: 07/28/2021 Reason for consult: Initial assessment;Early term 37-38.6wks Age:31 hours  Initial lactation visit. 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 Planned BTL today. Mom BF her first baby for 5 months, and second for 13 months. Plans to continue exclusive breastfeeding.  Baby has documented feeds and output, and passed 24hr screens.  Reviewed with mom newborn BF basics: newborn stomach size, feeding patterns and behaviors, early cues, and feeding on demand. Guidance given for 8 or more feedings per 24hrs moving forward. Encouraged and taught hand expression where colostrum was easily expressed- encouraged to use this to help baby achieve a deep latch through wide open mouth.  Whiteboard updated with LC name/number- encouraged to call with questions and for ongoing BF support as needed.  Maternal Data Has patient been taught Hand Expression?: Yes Does the patient have breastfeeding experience prior to this delivery?: Yes How long did the patient breastfeed?: 5 mo, 4mo  Feeding Mother's Current Feeding Choice: Breast Milk  LATCH Score                    Lactation Tools Discussed/Used    Interventions Interventions: Breast feeding basics reviewed;Education;Hand express  Discharge    Consult Status Consult Status: Follow-up Date: 07/28/21 Follow-up type: Call as needed    Danford Bad 07/28/2021, 9:54 AM

## 2021-07-29 ENCOUNTER — Encounter: Payer: Self-pay | Admitting: Obstetrics and Gynecology

## 2021-07-29 NOTE — Anesthesia Postprocedure Evaluation (Signed)
Anesthesia Post Note  Patient: Jennifer Best  Procedure(s) Performed: POST PARTUM TUBAL LIGATION (Bilateral)  Patient location during evaluation: PACU Anesthesia Type: General Level of consciousness: awake and alert Pain management: pain level controlled Vital Signs Assessment: post-procedure vital signs reviewed and stable Respiratory status: spontaneous breathing, nonlabored ventilation, respiratory function stable and patient connected to nasal cannula oxygen Cardiovascular status: blood pressure returned to baseline and stable Postop Assessment: no apparent nausea or vomiting Anesthetic complications: no   No notable events documented.   Last Vitals:  Vitals:   07/28/21 1515 07/28/21 1626  BP: 126/76 138/82  Pulse: 67 76  Resp: 18 18  Temp: 36.6 C 36.6 C  SpO2: 97%     Last Pain:  Vitals:   07/28/21 1626  TempSrc: Axillary  PainSc:                  Cleda Mccreedy [name]

## 2021-07-30 NOTE — Discharge Summary (Addendum)
Postpartum Discharge Summary      Patient Name: Jennifer Best DOB: Feb 12, 1991 MRN: [id number]  Date of admission: 07/26/2021 Delivery date:07/27/2021  Delivering provider: Rubie Maid  Date of discharge: 07/30/2021  Admitting diagnosis: PROM (premature rupture of membranes) [O42.90] Intrauterine pregnancy: [redacted]w[redacted]d    Secondary diagnosis:  Active Problems:   History of gestational diabetes in prior pregnancy, currently pregnant   Anxiety in pregnancy, antepartum   Subclinical hyperthyroidism   Diet controlled gestational diabetes mellitus (GDM) in third trimester   Obesity in pregnancy, antepartum, third trimester   PROM (premature rupture of membranes)  Additional problems: Gestational HTN at term (diagnosed at labor), Anemia of pregnancy (mild)   Discharge diagnosis: Term Pregnancy Delivered, Gestational Hypertension, GDM A1, and Anemia                                              Post partum procedures:postpartum tubal ligation Augmentation: Pitocin, Cytotec, and IP Foley Complications: None  Hospital course: Induction of Labor With Vaginal Delivery   30y.o. yo [id number] admitted to the hospital 07/26/2021 for induction of labor.  Indication for induction: PROM.  Patient had an uncomplicated labor course as follows: Membrane Rupture Time/Date: 12:15 PM ,07/26/2021   Delivery Method:Vaginal, Spontaneous  Episiotomy: None  Lacerations:  None  Details of delivery can be found in separate delivery note.  Patient had a routine postpartum course. Patient is discharged home 07/30/21.  Newborn Data: Birth date:07/27/2021  Birth time:8:41 AM  Gender:Female  Living status:Living  Apgars:8 ,8  Weight:3780 g   Magnesium Sulfate received: No BMZ received: No Rhophylac:No MMR:No T-DaP:Given prenatally Flu: No Transfusion:No  Physical exam  Vitals:   07/28/21 1505 07/28/21 1510 07/28/21 1515 07/28/21 1626  BP:   126/76 138/82  Pulse: 64 79 67 76   Resp: _0 Temp:   97.9 F (36.6 C) 97.8 F (36.6 C)  TempSrc:   Axillary Axillary  SpO2: 99% 95% 97%   Weight:      Height:       General: alert and no distress Lochia: appropriate Uterine Fundus: firm Incision: N/A DVT Evaluation: No evidence of DVT seen on physical exam. Negative Homan's sign. No cords or calf tenderness. No significant calf/ankle edema.   Labs: Lab Results  Component Value Date   WBC 9.6 07/28/2021   HGB 8.8 (L) 07/28/2021   HCT 27.3 (L) 07/28/2021   MCV 85.8 07/28/2021   PLT 161 07/28/2021    CMP Latest Ref Rng & Units 07/26/2021  Glucose 70 - 99 mg/dL 90  BUN 6 - 20 mg/dL 7  Creatinine 0.44 - 1.00 mg/dL 0.51  Sodium 135 - 145 mmol/L 135  Potassium 3.5 - 5.1 mmol/L 3.8  Chloride 98 - 111 mmol/L 106  CO2 22 - 32 mmol/L 21(L)  Calcium 8.9 - 10.3 mg/dL 9.8  Total Protein 6.5 - 8.1 g/dL 6.1(L)  Total Bilirubin 0.3 - 1.2 mg/dL 0.6  Alkaline Phos 38 - 126 U/L 176(H)  AST 15 - 41 U/L 28  ALT 0 - 44 U/L 46(H)   EdFlavia Shippercore: Edinburgh Postnatal Depression Scale Screening Tool 07/27/2021  I have been able to laugh and see the funny side of things. 0  I have looked forward with enjoyment to things. 0  I have blamed myself unnecessarily when things went  wrong. 1  I have been anxious or worried for no good reason. 2  I have felt scared or panicky for no good reason. 2  Things have been getting on top of me. 1  I have been so unhappy that I have had difficulty sleeping. 1  I have felt sad or miserable. 1  I have been so unhappy that I have been crying. 1  The thought of harming myself has occurred to me. 0  Edinburgh Postnatal Depression Scale Total 9      After visit meds:  Allergies as of 07/28/2021       Reactions   Morphine And Related Other (See Comments)   Makes "veins stand up"   Latex Rash, Other (See Comments)   Watery eyes        Medication List     STOP taking these medications    Accu-Chek Guide test  strip Generic drug: glucose blood   aspirin EC 81 MG tablet   blood glucose meter kit and supplies Kit       TAKE these medications    ALLERGY 24-HR PO Take 1 tablet by mouth daily.   docusate sodium 100 MG capsule Commonly known as: COLACE Take 1 capsule (100 mg total) by mouth 2 (two) times daily as needed.   ferrous sulfate 325 (65 FE) MG tablet Commonly known as: FerrouSul Take 1 tablet (325 mg total) by mouth 2 (two) times daily.   ibuprofen 600 MG tablet Commonly known as: ADVIL Take 1 tablet (600 mg total) by mouth every 6 (six) hours as needed.   PRENATAL GUMMIES PO Take by mouth.   sertraline 50 MG tablet Commonly known as: ZOLOFT Take 1 tablet (50 mg total) by mouth daily.         Discharge home in stable condition Infant Feeding: Breast Infant Disposition:home with mother Discharge instruction: per After Visit Summary and Postpartum booklet. Activity: Advance as tolerated. Pelvic rest for 6 weeks.  Diet: routine diet Anticipated Birth Control: BTL done PP Postpartum Appointment:6 weeks Additional Postpartum F/U: Postpartum Depression checkup and BP check 1 week Future Appointments: Future Appointments  Date Time Provider [place]  08/02/2021  8:45 AM [facility] NURSE [facility] None  09/07/2021  2:45 PM Rubie Maid, MD [facility] None   Follow up Visit:  Follow-up Information     Rubie Maid, MD Follow up on 08/02/2021.   Specialties: Obstetrics and Gynecology, Radiology Why: 3-4 days for BP check (nurse visit)- 8:45 am 2 week postpartum mood check (televisit) 6 week postpartum check in office. Contact information: [address] [place] [address] [phone]                     07/30/2021 Rubie Maid, MD

## 2021-08-02 ENCOUNTER — Other Ambulatory Visit: Payer: Self-pay

## 2021-08-02 ENCOUNTER — Ambulatory Visit (INDEPENDENT_AMBULATORY_CARE_PROVIDER_SITE_OTHER): Payer: Managed Care, Other (non HMO) | Admitting: Obstetrics and Gynecology

## 2021-08-02 VITALS — BP 144/87 | HR 73 | Ht 65.0 in | Wt 242.3 lb

## 2021-08-02 DIAGNOSIS — O165 Unspecified maternal hypertension, complicating the puerperium: Secondary | ICD-10-CM | POA: Diagnosis not present

## 2021-08-02 DIAGNOSIS — I10 Essential (primary) hypertension: Secondary | ICD-10-CM

## 2021-08-02 DIAGNOSIS — Z0130 Encounter for examination of blood pressure without abnormal findings: Secondary | ICD-10-CM

## 2021-08-02 LAB — SURGICAL PATHOLOGY

## 2021-08-02 MED ORDER — HYDROCHLOROTHIAZIDE 25 MG PO TABS
25.0000 mg | ORAL_TABLET | Freq: Every day | ORAL | 1 refills | Status: DC
Start: 2021-08-02 — End: 2022-01-19

## 2021-08-02 NOTE — Progress Notes (Addendum)
Pt presents for blood pressure check. BP was above normal limits at 144/87 P 73 WT 242 lbs HT 5 ft 5 in BMI 40.32. No questions or concerns voiced by patient at this time.

## 2021-08-02 NOTE — Progress Notes (Signed)
Reviewed vital signs.  Patient with h/o gestational HTN at term (diagnosed during labor and delivery, had labile BPs postpartum). Is for 1 week BP check. BP elevated today. Will initiate on HCTZ 25 mg daily until postpartum visit.

## 2021-08-02 NOTE — Progress Notes (Signed)
Spoke with patient in regards to elevated BP reading at nurse visit. Per Dr. Valentino Saxon: patient is to start taking HCTZ 25 mg daily until patient comes back for PPV. Patient verbalized understanding and agrees to plan, verbalized no further questions concerns.

## 2021-08-20 ENCOUNTER — Other Ambulatory Visit: Payer: Self-pay | Admitting: Obstetrics and Gynecology

## 2021-08-25 ENCOUNTER — Other Ambulatory Visit: Payer: Self-pay | Admitting: Obstetrics and Gynecology

## 2021-08-25 DIAGNOSIS — I10 Essential (primary) hypertension: Secondary | ICD-10-CM

## 2021-09-07 ENCOUNTER — Ambulatory Visit (INDEPENDENT_AMBULATORY_CARE_PROVIDER_SITE_OTHER): Payer: Managed Care, Other (non HMO) | Admitting: Obstetrics and Gynecology

## 2021-09-07 ENCOUNTER — Other Ambulatory Visit: Payer: Self-pay

## 2021-09-07 ENCOUNTER — Encounter: Payer: Self-pay | Admitting: Obstetrics and Gynecology

## 2021-09-07 DIAGNOSIS — O9081 Anemia of the puerperium: Secondary | ICD-10-CM | POA: Diagnosis not present

## 2021-09-07 DIAGNOSIS — Z9851 Tubal ligation status: Secondary | ICD-10-CM | POA: Diagnosis not present

## 2021-09-07 DIAGNOSIS — F419 Anxiety disorder, unspecified: Secondary | ICD-10-CM

## 2021-09-07 NOTE — Progress Notes (Signed)
   OBSTETRICS POSTPARTUM CLINIC PROGRESS NOTE  Subjective:     Jennifer Best is a 30 y.o. [phone] female who presents for a postpartum visit. She is 6 week postpartum following a spontaneous vaginal delivery. I have fully reviewed the prenatal and intrapartum course. The delivery was at 38 gestational weeks.  Anesthesia: none. Postpartum course has been uncomplicated. Baby's course has been uncomplicated. Baby is feeding by breast. Bleeding: patient has not resumed menses, with No LMP recorded. Bowel function is normal. Bladder function is normal. Patient is sexually active. Contraception method desired is tubal ligation (performed postpartum). Postpartum depression screening: 7.  EDPS score is 7.    The following portions of the patient's history were reviewed and updated as appropriate: allergies, current medications, past family history, past medical history, past social history, past surgical history, and problem list.  Review of Systems A comprehensive review of systems was negative except for: Musculoskeletal: positive for back pain x 1 week (pain mostly between shoulder blades).    Objective:    BP 131/88   Pulse 85   Resp 16   Ht 5\' 5"  (1.651 m)   Wt 232 lb 4.8 oz (105.4 kg)   SpO2 99%   BMI 38.66 kg/m   General:  alert and no distress   Breasts:  inspection negative, no nipple discharge or bleeding, no masses or nodularity palpable  Lungs: clear to auscultation bilaterally  Heart:  regular rate and rhythm, S1, S2 normal, no murmur, click, rub or gallop  Abdomen: soft, non-tender; bowel sounds normal; no masses,  no organomegaly.  Well healed infraumbilical incision   Vulva:  normal  Vagina: normal vagina, no discharge, exudate, lesion, or erythema  Cervix:  no cervical motion tenderness and no lesions  Corpus: normal size, contour, position, consistency, mobility, non-tender  Adnexa:  normal adnexa and no mass, fullness, tenderness  Rectal Exam: Not performed.          Labs:  Lab Results  Component Value Date   HGB 8.8 (L) 07/28/2021    Assessment:   1. Postpartum care following vaginal delivery   2. Postpartum anemia   3. S/P tubal ligation   4. Lactating mother   5. Anxiety      Plan:   1. Contraception: tubal ligation 2. Will check Hgb for h/o postpartum anemia of less than 10.  3. Doing well with lactation.  4.  Anxiety managed with Zoloft. PPD screen negative today. Notes she had some postpartum blues but that has improved. 5. Follow up in: 6 months for annual exam, or sooner as needed.   09/27/2021, MD [facility]

## 2021-09-07 NOTE — Patient Instructions (Signed)
Postpartum Care After Vaginal Delivery The following information offers guidance about how to care for yourself from the time you deliver your baby to 6-12 weeks after delivery (postpartum period). If you have problems or questions, contact your health care provider for more specific instructions. Follow these instructions at home: Vaginal bleeding It is normal to have vaginal bleeding (lochia) after delivery. Wear a sanitary pad for bleeding and discharge. During the first week after delivery, the amount and appearance of lochia is often similar to a menstrual period. Over the next few weeks, it will gradually decrease to a dry, yellow-brown discharge. For most women, lochia stops completely by 4-6 weeks after delivery, but can vary. Change your sanitary pads frequently. Watch for any changes in your flow, such as: A sudden increase in volume. A change in color. Large blood clots. If you pass a blood clot from your vagina, save it and call your health care provider. Do not flush blood clots down the toilet before talking with your health care provider. Do not use tampons or douches until your health care provider approves. If you are not breastfeeding, your period should return 6-8 weeks after delivery. If you are feeding your baby breast milk only, your period may not return until you stop breastfeeding. Perineal care  Keep the area between the vagina and the anus (perineum) clean and dry. Use medicated pads and pain-relieving sprays and creams as directed. If you had a surgical cut in the perineum (episiotomy) or a tear, check the area for signs of infection until you are healed. Check for: More redness, swelling, or pain. Fluid or blood coming from the cut or tear. Warmth. Pus or a bad smell. You may be given a squirt bottle to use instead of wiping to clean the perineum area after you use the bathroom. Pat the area gently to dry it. To relieve pain caused by an episiotomy, a tear, or  swollen veins in the anus (hemorrhoids), take a warm sitz bath 2-3 times a day. In a sitz bath, the warm water should only come up to your hips and cover your buttocks. Breast care In the first few days after delivery, your breasts may feel heavy, full, and uncomfortable (breast engorgement). Milk may also leak from your breasts. Ask your health care provider about ways to help relieve the discomfort. If you are breastfeeding: Wear a bra that supports your breasts and fits well. Use breast pads to absorb milk that leaks. Keep your nipples clean and dry. Apply creams and ointments as told. You may have uterine contractions every time you breastfeed for up to several weeks after delivery. This helps your uterus return to its normal size. If you have any problems with breastfeeding, notify your health care provider or lactation consultant. If you are not breastfeeding: Avoid touching your breasts. Do not squeeze out (express) milk. Doing this can make your breasts produce more milk. Wear a good-fitting bra and use cold packs to help with swelling. Intimacy and sexuality Ask your health care provider when you can engage in sexual activity. This may depend upon: Your risk of infection. How fast you are healing. Your comfort and desire to engage in sexual activity. You are able to get pregnant after delivery, even if you have not had your period. Talk with your health care provider about methods of birth control (contraception) or family planning if you desire future pregnancies. Medicines Take over-the-counter and prescription medicines only as told by your health care provider. Take an   over-the-counter stool softener to help ease bowel movements as told by your health care provider. If you were prescribed an antibiotic medicine, take it as told by your health care provider. Do not stop taking the antibiotic even if you start to feel better. Review all previous and current prescriptions to check for  possible transfer into breast milk. Activity Gradually return to your normal activities as told by your health care provider. Rest as much as possible. Nap while your baby is sleeping. Eating and drinking  Drink enough fluid to keep your urine pale yellow. To help prevent or relieve constipation, eat high-fiber foods every day. Choose healthy eating to support breastfeeding or weight loss goals. Take your prenatal vitamins until your health care provider tells you to stop. General tips/recommendations Do not use any products that contain nicotine or tobacco. These products include cigarettes, chewing tobacco, and vaping devices, such as e-cigarettes. If you need help quitting, ask your health care provider. Do not drink alcohol, especially if you are breastfeeding. Do not take medications or drugs that are not prescribed to you, especially if you are breastfeeding. Visit your health care provider for a postpartum checkup within the first 3-6 weeks after delivery. Complete a comprehensive postpartum visit no later than 12 weeks after delivery. Keep all follow-up visits for you and your baby. Contact a health care provider if: You feel unusually sad or worried. Your breasts become red, painful, or hard. You have a fever or other signs of an infection. You have bleeding that is soaking through one pad an hour or you have blood clots. You have a severe headache that doesn't go away or you have vision changes. You have nausea and vomiting and are unable to eat or drink anything for 24 hours. Get help right away if: You have chest pain or difficulty breathing. You have sudden, severe leg pain. You faint or have a seizure. You have thoughts about hurting yourself or your baby. If you ever feel like you may hurt yourself or others, or have thoughts about taking your own life, get help right away. Go to your nearest emergency department or: Call your local emergency services (911 in the  [place] The National Suicide Prevention Lifeline at 1-800-273-8255. This suicide crisis helpline is open 24 hours a day. Text the Crisis Text Line at 741741 (in the [place] Summary The period of time after you deliver your newborn up to 6-12 weeks after delivery is called the postpartum period. Keep all follow-up visits for you and your baby. Review all previous and current prescriptions to check for possible transfer into breast milk. Contact a health care provider if you feel unusually sad or worried during the postpartum period. This information is not intended to replace advice given to you by your health care provider. Make sure you discuss any questions you have with your health care provider. Document Revised: 07/29/2020 Document Reviewed: 07/29/2020 Elsevier Patient Education  2022 Elsevier Inc.  

## 2021-12-30 ENCOUNTER — Encounter: Payer: Managed Care, Other (non HMO) | Admitting: Obstetrics and Gynecology

## 2022-01-16 NOTE — Progress Notes (Signed)
° ° °  GYNECOLOGY PROGRESS NOTE  Subjective:    Patient ID: Jennifer Best, female    DOB: 1991/11/23, 31 y.o.   MRN: [id number]  HPI  Patient is a 31 y.o. [id number] female who presents for mass/bulge at the entrance of her vagina. She reports that she and her husband was sick at the end of last month with flu and stomach virus. She had a lot of coughing during that time and she noticed when she went to bathroom that she had the bulge, which she assumes that it maybe prolapse. She feels discomfort and pressure. Denies urinary or bowel incontinence or other issues.   She also complains of dry rash on her buttock, has been there since her last pregnancy.  Now noticing more flaking and itching recently. Currently using a mild soap (Dove sensitive skin).    The following portions of the patient's history were reviewed and updated as appropriate: allergies, current medications, past family history, past medical history, past social history, past surgical history, and problem list.  Review of Systems Pertinent items noted in HPI and remainder of comprehensive ROS otherwise negative.   Objective:   Blood pressure (!) 141/76, pulse 83, resp. rate 16, height 5\' 5"  (1.651 m), weight 220 lb 14.4 oz (100.2 kg), currently breastfeeding.  Body mass index is 36.76 kg/m.  Repeat BP 126/96.  General appearance: alert and no distress Abdomen: soft, non-tender; bowel sounds normal; no masses,  no organomegaly Pelvic: external genitalia normal, rectovaginal septum normal.  Vagina with scant clear discharge.  Cervix normal appearing, no lesions and no motion tenderness.  Uterus mobile, nontender, normal shape and size.  Grade 2 cystocele noted on Valsalva, Grade 1 rectocele.  Adnexae not examined.  Skin:  eczema - buttock(s) midline with scaling and flaking, mildly erythematous.  Neurologic: grossly normal   Assessment:   1. Pelvic organ prolapse quantification stage 2 cystocele   2. Other eczema    3. Elevated blood pressure reading without diagnosis of hypertension      Plan:   1. Pelvic organ prolapse quantification stage 2 cystocele - Ambulatory referral to Physical Therapy - If no resolve, can consider use of pessary. Would not consider surgical intervention until later in life unless symptoms significantly worsen..   2. Other eczema - Hydrocortisone cream prescribed. Advised to use along with Aquaphor.   3. Elevated BP  - Patient with history of gestational GHTN that developed in labor with last pregnancy almost 6 months ago. Will continue to monitor. Advise to check BPs on occasion at home. Will continue to monitor.    Call or return to clinic prn if these symptoms worsen or fail to improve as anticipated. Return to clinic for any scheduled appointments or for any gynecologic concerns as needed.     , MD [facility]

## 2022-01-18 ENCOUNTER — Encounter: Payer: Managed Care, Other (non HMO) | Admitting: Obstetrics and Gynecology

## 2022-01-19 ENCOUNTER — Ambulatory Visit (INDEPENDENT_AMBULATORY_CARE_PROVIDER_SITE_OTHER): Payer: Managed Care, Other (non HMO) | Admitting: Obstetrics and Gynecology

## 2022-01-19 ENCOUNTER — Encounter: Payer: Self-pay | Admitting: Obstetrics and Gynecology

## 2022-01-19 ENCOUNTER — Other Ambulatory Visit: Payer: Self-pay

## 2022-01-19 VITALS — BP 126/95 | HR 83 | Resp 16 | Ht 65.0 in | Wt 220.9 lb

## 2022-01-19 DIAGNOSIS — N8110 Cystocele, unspecified: Secondary | ICD-10-CM | POA: Diagnosis not present

## 2022-01-19 DIAGNOSIS — L308 Other specified dermatitis: Secondary | ICD-10-CM

## 2022-01-19 DIAGNOSIS — R030 Elevated blood-pressure reading, without diagnosis of hypertension: Secondary | ICD-10-CM | POA: Diagnosis not present

## 2022-01-19 MED ORDER — HYDROCORTISONE ACETATE 2.5 % EX CREA: TOPICAL_CREAM | CUTANEOUS | 0 refills | Status: AC

## 2022-02-03 ENCOUNTER — Ambulatory Visit: Payer: Managed Care, Other (non HMO) | Admitting: Physical Therapy

## 2022-02-10 ENCOUNTER — Encounter: Payer: Self-pay | Admitting: Physical Therapy

## 2022-02-10 ENCOUNTER — Ambulatory Visit: Payer: Managed Care, Other (non HMO) | Attending: Obstetrics and Gynecology | Admitting: Physical Therapy

## 2022-02-10 ENCOUNTER — Other Ambulatory Visit: Payer: Self-pay

## 2022-02-10 DIAGNOSIS — M6281 Muscle weakness (generalized): Secondary | ICD-10-CM | POA: Diagnosis present

## 2022-02-10 DIAGNOSIS — R278 Other lack of coordination: Secondary | ICD-10-CM | POA: Insufficient documentation

## 2022-02-10 DIAGNOSIS — N8110 Cystocele, unspecified: Secondary | ICD-10-CM | POA: Diagnosis not present

## 2022-02-10 DIAGNOSIS — R293 Abnormal posture: Secondary | ICD-10-CM | POA: Insufficient documentation

## 2022-02-10 NOTE — Therapy (Signed)
[facility] [address] [place], [place], [address] Phone: [phone]   Fax:  [phone]  Physical Therapy Evaluation  Patient Details  Name: Jennifer Best MRN: [id number] Date of Birth: 09/18/1991 Referring Provider (PT): Hildred Laser   Encounter Date: 02/10/2022   PT End of Session - 02/10/22 0939     Visit Number 1    Number of Visits 12    Date for PT Re-Evaluation 05/05/22    Authorization Type IE 02/10/2022    PT Start Time 0935    PT Stop Time 1015    PT Time Calculation (min) 40 min    Activity Tolerance Patient tolerated treatment well    Behavior During Therapy [facility] for tasks assessed/performed             Past Medical History:  Diagnosis Date   Asthma    Gestational diabetes    Obesity (BMI 30-39.9)     Past Surgical History:  Procedure Laterality Date   CHOLECYSTECTOMY     LAPAROSCOPY Left 07/17/2016   Procedure: LAPAROSCOPY DIAGNOSTIC;  Surgeon: Hildred Laser, MD;  Location: [facility] ORS;  Service: Gynecology;  Laterality: Left;  removal ectopic pregnancy   TUBAL LIGATION Bilateral 07/28/2021   Procedure: POST PARTUM TUBAL LIGATION;  Surgeon: Hildred Laser, MD;  Location: [facility] ORS;  Service: Gynecology;  Laterality: Bilateral;   UNILATERAL SALPINGECTOMY Left 07/17/2016   Procedure: UNILATERAL SALPINGECTOMY;  Surgeon: Hildred Laser, MD;  Location: [facility] ORS;  Service: Gynecology;  Laterality: Left;    There were no vitals filed for this visit.        [facility] PT Assessment - 02/10/22 0001       Assessment   Medical Diagnosis N81.10 (ICD-10-CM) - Pelvic organ prolapse quantification stage 2 cystocele    Referring Provider (PT) Hildred Laser    Onset Date/Surgical Date 07/27/21    Hand Dominance Right    Prior Therapy None for this dx      Balance Screen   Has the patient fallen in the past 6 months No             PELVIC HEALTH PHYSICAL THERAPY EVALUATION  SCREENING Red Flags:  None Have you had any night sweats? Unexplained weight loss? Saddle anesthesia? Unexplained changes in bowel or bladder habits?  Precautions: LATEX FREE  SUBJECTIVE  Chief Complaint: Patient presents to clinic with partner and infant. Patient notes that she felt something at the vaginal area when toileting, and realized the entrance to the vagina was completely obscured by tissue. Patient notes that her symptoms are worse when more activity. She describes this as uncomfortable. She also feels this discomfort with prolonged sitting. Patient also has had a change in sexual function.   Patient notes that with anxiety she will have increased Bms.  Pertinent History:  Falls Negative.  Scoliosis Positive. Pulmonary disease/dysfunction Positive. Surgical history: Positive for see above.   Recent Procedures/Tests/Findings: Stage 2 cystocele  Obstetrical History: G6P4 Deliveries: SVD Tearing/Episiotomy: P1 tear  Gynecological History: Hysterectomy: No  Endometriosis: Negative Pelvic Organ Prolapse: Positive Last Menstrual Period:  Pain with exam: No Heaviness/pressure: Yes    Urinary History: Incontinence: Positive. Onset: as a child Triggers: sneezing, coughing, laughing. Amount: Min/Mod Protective undergarments: No Fluid Intake: 64-86 oz H20, unsure of caffeinated consumption (using Cirkul), no juices/sodas Nocturia: up to 4x/night Frequency of urination: every 2 hours Pain with urination: Negative Difficulty initiating urination: Negative Intermittent stream: Negative Frequent UTI: Negative.   Gastrointestinal History: [address]  Stool Chart: Type 4 (25%), 5-6 (60%), 7 (15%) Frequency of BMs: 3-4x/week Pain with defecation: Positive for recently; not typically. Straining with defecation: Negative Hemorrhoids: Positive; active. Incontinence: Negative.   Sexual activity/pain: Pain with intercourse: Positive for discomfort during foreplay.    Initial penetration: Yes with  fingers Deep thrusting: No External stimulation: No   Location of pain: vagina Current pain:  0/10  Max pain:  4-5/10 Least pain:  0/10 Pain quality: pain quality: discomfort Radiating pain: No   Current activities:  Caregiving and driving, door dash driving, art and music  Patient Goals:  "Getting back to normal"; "not having discomfort"; "not having any other random full release [of bladder]"   OBJECTIVE  Mental Status Patient is oriented to person, place and time.  Recent memory is intact.  Remote memory is intact.  Attention span and concentration are intact.  Expressive speech is intact.  Patient's fund of knowledge is within normal limits for educational level.  POSTURE/OBSERVATIONS:  Lumbar lordosis: significantly diminished with posterior pelvic tilt Thoracic kyphosis: increased Iliac crest height: not formally assessed Lumbar lateral shift: not formally assessed Pelvic obliquity: not formally assessed Leg length discrepancy: not formally assessed  GAIT: Grossly WFL. Patient ambulates with posterior pelvic tilt and significant anterior chain shortening which may be contributing to downward pressure on anterior aspect of pelvis and bladder.    RANGE OF MOTION: deferred 2/2 to time constraints   LEFT RIGHT  Lumbar forward flexion (65):      Lumbar extension (30):     Lumbar lateral flexion (25):     Thoracic and Lumbar rotation (30 degrees):       Hip Flexion (0-125):      Hip IR (0-45):     Hip ER (0-45):     Hip Abduction (0-40):     Hip extension (0-15):      SENSATION: deferred 2/2 to time constraints  STRENGTH: MMT deferred 2/2 to time constraints  RLE LLE  Hip Flexion    Hip Extension    Hip Abduction     Hip Adduction     Hip ER     Hip IR     Knee Extension    Knee Flexion    Dorsiflexion     Plantarflexion (seated)     ABDOMINAL: deferred 2/2 to time constraints Palpation: Diastasis: Scar mobility: Rib flare:  SPECIAL TESTS:  deferred 2/2 to time constraints   PHYSICAL PERFORMANCE MEASURES: STS: WFL Deep Squat: RLE STS: LLE STS:  : 5TSTS:    EXTERNAL PELVIC EXAM: deferred 2/2 to time constraints Palpation: Breath coordination: Voluntary Contraction: present/absent Relaxation: full/delayed/non-relaxing Perineal movement with sustained IAP increase ("bear down"): descent/no change/elevation/excessive descent Perineal movement with rapid IAP increase ("cough"): elevation/no change/descent  INTERNAL VAGINAL EXAM: deferred 2/2 to time constraints Introitus Appears:  Skin integrity:  Scar mobility: Strength (PERF):  Symmetry: Palpation: Prolapse:   INTERNAL RECTAL EXAM: not indicated Strength (PERF): Symmetry: Palpation: Prolapse:   OUTCOME MEASURES: FOTO (PFDI Prolapse 17)   ASSESSMENT Patient is a 31 year old presenting to clinic with chief complaints of pelvic organ prolapse. Today's evaluation is suggestive of deficits in posture, IAP management, PFM strength, and PFM coordination as evidenced by SUI, posterior pelvic tilt and increased thoracic kyphosis in both sitting and standing postures, patient ability to palpate bladder at introitus, 5/10 worst discomfort, change in sexual function. Patient's responses on PFDI Prolapse outcome measures (17) indicate moderate functional limitations/disability/distress. Patient's progress may be limited due to postural demands of  caregiving and occupation; however, patient's motivation is advantageous. Patient was able to achieve basic understanding of PFM functions and typical course of care during today's evaluation and responded positively to educational interventions. Patient will benefit from continued skilled therapeutic intervention to address deficits in posture, IAP management, PFM strength, and PFM coordination in order to increase function and improve overall QOL.  EDUCATION Patient educated on prognosis, POC, and provided with HEP including:  supported bridge, supported child's pose. Patient articulated understanding and returned demonstration. Patient will benefit from further education in order to maximize compliance and understanding for long-term therapeutic gains.    Objective measurements completed on examination: See above findings.        PT Long Term Goals - 02/10/22 1214       PT LONG TERM GOAL #1   Title Patient will demonstrate independence with HEP in order to maximize therapeutic gains and improve carryover from physical therapy sessions to ADLs in the home and community.    Baseline IE: not initiated    Time 12    Period Weeks    Status New    Target Date 05/05/22      PT LONG TERM GOAL #2   Title Patient will demonstrate improved function as evidenced by a score of <8 on FOTO measure for full participation in activities at home and in the community.    Baseline IE: 17    Time 12    Period Weeks    Status New    Target Date 05/05/22      PT LONG TERM GOAL #3   Title Patient will demonstrate circumferential and sequential contraction of >4/5 MMT, > 6 sec hold x10 and 5 consecutive quick flicks with </= 10 min rest between testing bouts, and relaxation of the PFM coordinated with breath for improved management of intra-abdominal pressure and normal bowel and bladder function without the presence of pain nor incontinence in order to improve participation at home and in the community.    Baseline IE: not demonstrated    Time 12    Period Weeks    Status New    Target Date 05/05/22      PT LONG TERM GOAL #4   Title Patient will be able to articulate and demonstrate 3-5 postures that encourage gravity assisted repositioning of pelvic organs to decrease discomfort and need for manual insertion of tissue at end of day in order to participate more fully in activities at home and in the community.    Baseline IE: not demonstrated    Time 12    Period Weeks    Status New    Target Date 05/05/22                     Plan - 02/10/22 0939     Clinical Impression Statement Patient is a 31 year old presenting to clinic with chief complaints of pelvic organ prolapse. Today's evaluation is suggestive of deficits in posture, IAP management, PFM strength, and PFM coordination as evidenced by SUI, posterior pelvic tilt and increased thoracic kyphosis in both sitting and standing postures, patient ability to palpate bladder at introitus, 5/10 worst discomfort, change in sexual function. Patient's responses on PFDI Prolapse outcome measures (17) indicate moderate functional limitations/disability/distress. Patient's progress may be limited due to postural demands of caregiving and occupation; however, patient's motivation is advantageous. Patient was able to achieve basic understanding of PFM functions and typical course of care during today's evaluation and responded positively to  educational interventions. Patient will benefit from continued skilled therapeutic intervention to address deficits in posture, IAP management, PFM strength, and PFM coordination in order to increase function and improve overall QOL.    Personal Factors and Comorbidities Age;Comorbidity 3+;Past/Current Experience;Time since onset of injury/illness/exacerbation    Comorbidities anxiety, asthma, gestational diabetes, obesity    Examination-Activity Limitations Sit;Stand;Lift;Squat;Continence    Examination-Participation Restrictions Occupation;Driving;Community Activity    Stability/Clinical Decision Making Evolving/Moderate complexity    Clinical Decision Making Moderate    Rehab Potential Good    PT Frequency 1x / week    PT Duration 12 weeks    PT Treatment/Interventions ADLs/Self Care Home Management;Cryotherapy;Electrical Stimulation;Moist Heat;Therapeutic activities;Therapeutic exercise;Neuromuscular re-education;Patient/family education;Orthotic Fit/Training;Manual techniques;Scar mobilization;Taping;Joint  Manipulations;Spinal Manipulations    PT Next Visit Plan physical assessment    PT Home Exercise Plan [email or web address]    Consulted and Agree with Plan of Care Patient             Patient will benefit from skilled therapeutic intervention in order to improve the following deficits and impairments:  Abnormal gait, Decreased activity tolerance, Decreased endurance, Decreased strength, Hypermobility, Improper body mechanics, Postural dysfunction, Pain  Visit Diagnosis: Other lack of coordination  Abnormal posture  Muscle weakness (generalized)     Problem List Patient Active Problem List   Diagnosis Date Noted   PROM (premature rupture of membranes) 07/26/2021   Irregular uterine contractions 07/25/2021   Obesity in pregnancy, antepartum, third trimester    Pregnancy 07/18/2021   Diet controlled gestational diabetes mellitus (GDM) in third trimester 06/03/2021   Subclinical hyperthyroidism 02/23/2021   History of gestational diabetes in prior pregnancy, currently pregnant 01/27/2021   Anxiety in pregnancy, antepartum 01/27/2021   Bronchitis 08/08/2017   Encounter to establish care 08/08/2017   Obesity (BMI 35.0-39.9 without comorbidity) 12/30/2016   History of ectopic pregnancy 06/30/2016    Sheria Lang PT, DPT [phone]  02/10/2022, 12:16 PM  [facility] [address] [place], [place], [address] Phone: [phone]   Fax:  [phone]  Name: Alfonso Ramus Ibbotson MRN: [id number] Date of Birth: 08-13-1991

## 2022-02-17 ENCOUNTER — Ambulatory Visit: Payer: Managed Care, Other (non HMO) | Admitting: Physical Therapy

## 2022-02-17 ENCOUNTER — Encounter: Payer: Self-pay | Admitting: Physical Therapy

## 2022-02-17 ENCOUNTER — Other Ambulatory Visit: Payer: Self-pay

## 2022-02-17 DIAGNOSIS — R278 Other lack of coordination: Secondary | ICD-10-CM | POA: Diagnosis not present

## 2022-02-17 DIAGNOSIS — M6281 Muscle weakness (generalized): Secondary | ICD-10-CM

## 2022-02-17 DIAGNOSIS — R293 Abnormal posture: Secondary | ICD-10-CM

## 2022-02-17 NOTE — Therapy (Signed)
[place] ?[facility] ?[address] [place], [place], [address] ?Phone: [phone]   Fax:  [phone] ? ?Physical Therapy Treatment ? ?Patient Details  ?Name: Jennifer Best ?MRN: [id number] ?Date of Birth: 12/14/1990 ?Referring Provider (PT): Hildred Laser ? ? ?Encounter Date: 02/17/2022 ? ? PT End of Session - 02/17/22 6629   ? ? Visit Number 2   ? Number of Visits 12   ? Date for PT Re-Evaluation 05/05/22   ? Authorization Type IE 02/10/2022   ? PT Start Time [phone]   ? PT Stop Time 1005   ? PT Time Calculation (min) 43 min   ? Activity Tolerance Patient tolerated treatment well   ? Behavior During Therapy [facility] for tasks assessed/performed   ? ?  ?  ? ?  ? ? ?Past Medical History:  ?Diagnosis Date  ? Asthma   ? Gestational diabetes   ? Obesity (BMI 30-39.9)   ? ? ?Past Surgical History:  ?Procedure Laterality Date  ? CHOLECYSTECTOMY    ? LAPAROSCOPY Left 07/17/2016  ? Procedure: LAPAROSCOPY DIAGNOSTIC;  Surgeon: Hildred Laser, MD;  Location: [facility];  Service: Gynecology;  Laterality: Left;  removal ectopic pregnancy  ? TUBAL LIGATION Bilateral 07/28/2021  ? Procedure: POST PARTUM TUBAL LIGATION;  Surgeon: Hildred Laser, MD;  Location: [facility];  Service: Gynecology;  Laterality: Bilateral;  ? UNILATERAL SALPINGECTOMY Left 07/17/2016  ? Procedure: UNILATERAL SALPINGECTOMY;  Surgeon: Hildred Laser, MD;  Location: [facility];  Service: Gynecology;  Laterality: Left;  ? ? ?There were no vitals filed for this visit. ? ? Subjective Assessment - 02/17/22 0921   ? ? Subjective Patient denies any changes since initial evaluation. Patient has been elevating hips which feels nice and also enjoyed the low back stretch from child's pose.   ? Currently in Pain? No/denies   ? ?  ?  ? ?  ? ?TREATMENT ? ?Pre-treatment assessment: ?RANGE OF MOTION:  ?  LEFT RIGHT  ?Lumbar forward flexion (65):  WNL    ?Lumbar extension (30): WNL    ?Lumbar lateral flexion (25):  WNL WNL  ?Thoracic and Lumbar  rotation (30 degrees):    WNL WNL  ?Hip Flexion (0-125):      ?Hip IR (0-45):     ?Hip ER (0-45):     ?Hip Abduction (0-40):     ?Hip extension (0-15):     ? ? ?SENSATION: ?Grossly intact to light touch bilateral LEs as determined by testing dermatomes L2-S2 ?Proprioception and hot/cold testing deferred on this date ? ?STRENGTH: MMT  ? RLE LLE  ?Hip Flexion 4 4  ?Hip Extension    ?Hip Abduction  5 5  ?Hip Adduction  5 5  ?Hip ER  5 5  ?Hip IR  5 5  ?Knee Extension 5 5  ?Knee Flexion 5 5  ?Dorsiflexion  5 5  ?Plantarflexion (seated) 5 5  ?Hip flexion testing involved lumbar extension compensatory mechanism.  ? ?ABDOMINAL:  ?Palpation: no TTP ?Diastasis: 2.5 finger superior, 4 finger at umbilicus, 3 finger inferior to umbilicus ?Rib flare: none noted ? ?SPECIAL TESTS: ?SLR (SN 92, -LR 0.29): R: Negative L:  Negative ?FABER (SN 81): R: Negative L: Negative ?FADIR (SN 94): R: Negative L: Negative ? ? ?EXTERNAL PELVIC EXAM: Patient educated on the purpose of the pelvic exam and articulated understanding; patient consented to the exam verbally. ?Breath coordination: present ?Voluntary Contraction: present, 2/5, x5 reps with breath holding strategy ?Relaxation: full ?Perineal movement with  sustained IAP increase ("bear down"): descent ?Perineal movement with rapid IAP increase ("cough"): descent ? ?Manual Therapy: ? ? ?Neuromuscular Re-education: ?Supine hooklying diaphragmatic breathing with VCs and TCs for downregulation of the nervous system and improved management of IAP ?Supine hooklying, TrA activation with exhalation . VCs and TCs to decrease compensatory patterns and minimize aggravation of the lumbar paraspinals. ?Supine hooklying, TrA activation with exhalation and DRAM compression/correction. VCs and TCs to decrease compensatory patterns and minimize aggravation of the lumbar paraspinals. ? ?Therapeutic Exercise: ? ? ?Treatments unbilled: ? ?Post-treatment assessment: ? ?Patient educated throughout session on  appropriate technique and form using multi-modal cueing, HEP, and activity modification. Patient articulated understanding and returned demonstration. ? ?Patient Response to interventions: ?Comfortable to trial new exercises ? ?ASSESSMENT ?Patient presents to clinic with excellent motivation to participate in therapy. Patient demonstrates deficits in posture, IAP management, PFM strength, and PFM coordination. Assessment today suggestive of hypermobility in joints of the spine and hips as well as presence of DRAM (4 finger width at umbilicus). Patient able to achieve TrA activation in supine with coordinated breath during today's session and responded positively to active interventions. Patient will benefit from continued skilled therapeutic intervention to address remaining deficits in posture, IAP management, PFM strength, and PFM coordination in order to increase function and improve overall QOL. ? ? ? ? PT Long Term Goals - 02/10/22 1214   ? ?  ? PT LONG TERM GOAL #1  ? Title Patient will demonstrate independence with HEP in order to maximize therapeutic gains and improve carryover from physical therapy sessions to ADLs in the home and community.   ? Baseline IE: not initiated   ? Time 12   ? Period Weeks   ? Status New   ? Target Date 05/05/22   ?  ? PT LONG TERM GOAL #2  ? Title Patient will demonstrate improved function as evidenced by a score of <8 on FOTO measure for full participation in activities at home and in the community.   ? Baseline IE: 17   ? Time 12   ? Period Weeks   ? Status New   ? Target Date 05/05/22   ?  ? PT LONG TERM GOAL #3  ? Title Patient will demonstrate circumferential and sequential contraction of >4/5 MMT, > 6 sec hold x10 and 5 consecutive quick flicks with </= 10 min rest between testing bouts, and relaxation of the PFM coordinated with breath for improved management of intra-abdominal pressure and normal bowel and bladder function without the presence of pain nor incontinence in  order to improve participation at home and in the community.   ? Baseline IE: not demonstrated   ? Time 12   ? Period Weeks   ? Status New   ? Target Date 05/05/22   ?  ? PT LONG TERM GOAL #4  ? Title Patient will be able to articulate and demonstrate 3-5 postures that encourage gravity assisted repositioning of pelvic organs to decrease discomfort and need for manual insertion of tissue at end of day in order to participate more fully in activities at home and in the community.   ? Baseline IE: not demonstrated   ? Time 12   ? Period Weeks   ? Status New   ? Target Date 05/05/22   ? ?  ?  ? ?  ? ? ? ? ? ? ? ? Plan - 02/17/22 0923   ? ? Clinical Impression Statement Patient presents to clinic with  excellent motivation to participate in therapy. Patient demonstrates deficits in posture, IAP management, PFM strength, and PFM coordination. Assessment today suggestive of hypermobility in joints of the spine and hips as well as presence of DRAM (4 finger width at umbilicus). Patient able to achieve TrA activation in supine with coordinated breath during today's session and responded positively to active interventions. Patient will benefit from continued skilled therapeutic intervention to address remaining deficits in posture, IAP management, PFM strength, and PFM coordination in order to increase function and improve overall QOL.   ? Personal Factors and Comorbidities Age;Comorbidity 3+;Past/Current Experience;Time since onset of injury/illness/exacerbation   ? Comorbidities anxiety, asthma, gestational diabetes, obesity   ? Examination-Activity Limitations Sit;Stand;Lift;Squat;Continence   ? Examination-Participation Restrictions Occupation;Driving;Community Activity   ? Stability/Clinical Decision Making Evolving/Moderate complexity   ? Rehab Potential Good   ? PT Frequency 1x / week   ? PT Duration 12 weeks   ? PT Treatment/Interventions ADLs/Self Care Home Management;Cryotherapy;Electrical Stimulation;Moist  Heat;Therapeutic activities;Therapeutic exercise;Neuromuscular re-education;Patient/family education;Orthotic Fit/Training;Manual techniques;Scar mobilization;Taping;Joint Manipulations;Spinal Manipulations

## 2022-02-24 ENCOUNTER — Encounter: Payer: Self-pay | Admitting: Obstetrics and Gynecology

## 2022-02-24 ENCOUNTER — Ambulatory Visit: Payer: Managed Care, Other (non HMO) | Admitting: Physical Therapy

## 2022-02-24 MED ORDER — SERTRALINE HCL 100 MG PO TABS: 100.0000 mg | ORAL_TABLET | Freq: Every day | ORAL | 3 refills | Status: AC

## 2022-02-28 ENCOUNTER — Other Ambulatory Visit: Payer: Self-pay | Admitting: Obstetrics and Gynecology

## 2022-02-28 DIAGNOSIS — Z3481 Encounter for supervision of other normal pregnancy, first trimester: Secondary | ICD-10-CM

## 2022-03-02 LAB — FETAL NONSTRESS TEST

## 2022-03-03 ENCOUNTER — Encounter: Payer: Self-pay | Admitting: Physical Therapy

## 2022-03-03 ENCOUNTER — Ambulatory Visit: Payer: Managed Care, Other (non HMO) | Attending: Obstetrics and Gynecology | Admitting: Physical Therapy

## 2022-03-03 DIAGNOSIS — R293 Abnormal posture: Secondary | ICD-10-CM | POA: Insufficient documentation

## 2022-03-03 DIAGNOSIS — R278 Other lack of coordination: Secondary | ICD-10-CM | POA: Diagnosis present

## 2022-03-03 DIAGNOSIS — M6281 Muscle weakness (generalized): Secondary | ICD-10-CM | POA: Insufficient documentation

## 2022-03-03 NOTE — Therapy (Signed)
?OUTPATIENT PHYSICAL THERAPY TREATMENT NOTE ? ? ?Patient Name: Jennifer Best ?MRN: [id number] ?DOB:03-15-1991, 31 y.o., female ?Today's Date: 03/03/2022 ? ?PCP: Hildred Laser, MD ?REFERRING PROVIDER: Hildred Laser, MD ? ? PT End of Session - 03/03/22 0932   ? ? Visit Number 3   ? Number of Visits 12   ? Date for PT Re-Evaluation 05/05/22   ? Authorization Type IE 02/10/2022   ? PT Start Time 0930   ? PT Stop Time 1010   ? PT Time Calculation (min) 40 min   ? Activity Tolerance Patient tolerated treatment well   ? Behavior During Therapy [facility] for tasks assessed/performed   ? ?  ?  ? ?  ? ? ?Past Medical History:  ?Diagnosis Date  ? Asthma   ? Gestational diabetes   ? Obesity (BMI 30-39.9)   ? ?Past Surgical History:  ?Procedure Laterality Date  ? CHOLECYSTECTOMY    ? LAPAROSCOPY Left 07/17/2016  ? Procedure: LAPAROSCOPY DIAGNOSTIC;  Surgeon: Hildred Laser, MD;  Location: [facility];  Service: Gynecology;  Laterality: Left;  removal ectopic pregnancy  ? TUBAL LIGATION Bilateral 07/28/2021  ? Procedure: POST PARTUM TUBAL LIGATION;  Surgeon: Hildred Laser, MD;  Location: [facility];  Service: Gynecology;  Laterality: Bilateral;  ? UNILATERAL SALPINGECTOMY Left 07/17/2016  ? Procedure: UNILATERAL SALPINGECTOMY;  Surgeon: Hildred Laser, MD;  Location: [facility];  Service: Gynecology;  Laterality: Left;  ? ?Patient Active Problem List  ? Diagnosis Date Noted  ? PROM (premature rupture of membranes) 07/26/2021  ? Irregular uterine contractions 07/25/2021  ? Obesity in pregnancy, antepartum, third trimester   ? Pregnancy 07/18/2021  ? Diet controlled gestational diabetes mellitus (GDM) in third trimester 06/03/2021  ? Subclinical hyperthyroidism 02/23/2021  ? History of gestational diabetes in prior pregnancy, currently pregnant 01/27/2021  ? Anxiety in pregnancy, antepartum 01/27/2021  ? Bronchitis 08/08/2017  ? Encounter to establish care 08/08/2017  ? Obesity (BMI 35.0-39.9 without comorbidity) 12/30/2016  ? History of  ectopic pregnancy 06/30/2016  ? ? ?REFERRING DIAG: N81.10 (ICD-10-CM) - Pelvic organ prolapse quantification stage 2 cystocele  ? ?THERAPY DIAG:  ?Other lack of coordination ? ?Abnormal posture ? ?Muscle weakness (generalized) ? ? ?PRECAUTIONS: None ? ?SUBJECTIVE: Patient notes that she did not have any leakage at trampoline park but did frequently empty bladder to avoid UI. Patient reports that everything seems to be decent as far as exercises and symptoms are concerned.  ? ?PAIN:  ?Are you having pain? No ? ? ?TREATMENT ? ?Physical assessment 02/17/2022 ?RANGE OF MOTION:  ?  LEFT RIGHT  ?Lumbar forward flexion (65):  WNL    ?Lumbar extension (30): WNL    ?Lumbar lateral flexion (25):  WNL WNL  ?Thoracic and Lumbar rotation (30 degrees):    WNL WNL  ?Hip Flexion (0-125):      ?Hip IR (0-45):     ?Hip ER (0-45):     ?Hip Abduction (0-40):     ?Hip extension (0-15):     ? ? ?SENSATION: ?Grossly intact to light touch bilateral LEs as determined by testing dermatomes L2-S2 ?Proprioception and hot/cold testing deferred on this date ? ?STRENGTH: MMT  ? RLE LLE  ?Hip Flexion 4 4  ?Hip Extension    ?Hip Abduction  5 5  ?Hip Adduction  5 5  ?Hip ER  5 5  ?Hip IR  5 5  ?Knee Extension 5 5  ?Knee Flexion 5 5  ?Dorsiflexion  5 5  ?Plantarflexion (seated) 5 5  ?Hip  flexion testing involved lumbar extension compensatory mechanism.  ? ?ABDOMINAL:  ?Palpation: no TTP ?Diastasis: 2.5 finger superior, 4 finger at umbilicus, 3 finger inferior to umbilicus ?Rib flare: none noted ? ?SPECIAL TESTS: ?SLR (SN 92, -LR 0.29): R: Negative L:  Negative ?FABER (SN 81): R: Negative L: Negative ?FADIR (SN 94): R: Negative L: Negative ? ? ?EXTERNAL PELVIC EXAM: Patient educated on the purpose of the pelvic exam and articulated understanding; patient consented to the exam verbally. ?Breath coordination: present ?Voluntary Contraction: present, 2/5, x5 reps with breath holding strategy ?Relaxation: full ?Perineal movement with sustained IAP increase  ("bear down"): descent ?Perineal movement with rapid IAP increase ("cough"): descent ? ?Manual Therapy: ? ? ?Neuromuscular Re-education: ?Patient performed core stabilization and gravity assisted prolapse repositioning interventions as follows: ?- Standing 'L' Stretch at [employer]  ?- Super Reclined Bound Angle Pose   ?- Seated Diastasis Correction with Lean Back  ?- Seated Diastasis Correction with Lean Back Hands Across Chest  ? ?Therapeutic Exercise: ? ? ?Treatments unbilled: ? ?Post-treatment assessment: ? ?Patient educated throughout session on appropriate technique and form using multi-modal cueing, HEP, and activity modification. Patient articulated understanding and returned demonstration. ? ?Patient Response to interventions: ? ? ?ASSESSMENT ?Patient presents to clinic with excellent motivation to participate in therapy. Patient demonstrates deficits in posture, IAP management, PFM strength, and PFM coordination. Patient able to perform all interventions with good form during today's session and was able to coordinate palpable squeeze and lift of PFM when cued for 4 repetitions. Patient will benefit from continued skilled therapeutic intervention to address remaining deficits in posture, IAP management, PFM strength, and PFM coordination in order to increase function and improve overall QOL. ? ? ? ? PT Long Term Goals - 02/10/22 1214   ? ?  ? PT LONG TERM GOAL #1  ? Title Patient will demonstrate independence with HEP in order to maximize therapeutic gains and improve carryover from physical therapy sessions to ADLs in the home and community.   ? Baseline IE: not initiated; 4/7: comfortable with current HEP  ? Time 12   ? Period Weeks   ? Status On-going   ? Target Date 05/05/22   ?  ? PT LONG TERM GOAL #2  ? Title Patient will demonstrate improved function as evidenced by a score of <8 on FOTO measure for full participation in activities at home and in the community.   ? Baseline IE: 17; 4/7: 29  ? Time 12    ? Period Weeks   ? Status On-going   ? Target Date 05/05/22   ?  ? PT LONG TERM GOAL #3  ? Title Patient will demonstrate circumferential and sequential contraction of >4/5 MMT, > 6 sec hold x10 and 5 consecutive quick flicks with </= 10 min rest between testing bouts, and relaxation of the PFM coordinated with breath for improved management of intra-abdominal pressure and normal bowel and bladder function without the presence of pain nor incontinence in order to improve participation at home and in the community.   ? Baseline IE: not demonstrated; 4/7: 3/5 MMT x4 quick flicks, 4 sec hold x2  ? Time 12   ? Period Weeks   ? Status On-going   ? Target Date 05/05/22   ?  ? PT LONG TERM GOAL #4  ? Title Patient will be able to articulate and demonstrate 3-5 postures that encourage gravity assisted repositioning of pelvic organs to decrease discomfort and need for manual insertion of tissue at end of  day in order to participate more fully in activities at home and in the community.   ? Baseline IE: not demonstrated; 4/7: child's pose  ? Time 12   ? Period Weeks   ? Status New   ? Target Date 05/05/22   ? ?  ?  ? ?  ? ? ? Plan - 03/03/22 1141   ? ? Clinical Impression Statement Patient presents to clinic with excellent motivation to participate in therapy. Patient demonstrates deficits in posture, IAP management, PFM strength, and PFM coordination. Patient able to perform all interventions with good form during today's session and was able to coordinate palpable squeeze and lift of PFM when cued for 4 repetitions. Patient will benefit from continued skilled therapeutic intervention to address remaining deficits in posture, IAP management, PFM strength, and PFM coordination in order to increase function and improve overall QOL.   ? Personal Factors and Comorbidities Age;Comorbidity 3+;Past/Current Experience;Time since onset of injury/illness/exacerbation   ? Comorbidities anxiety, asthma, gestational diabetes, obesity    ? Examination-Activity Limitations Sit;Stand;Lift;Squat;Continence   ? Examination-Participation Restrictions Occupation;Driving;Community Activity   ? Stability/Clinical Decision Making Evolving/Moderate

## 2022-03-04 ENCOUNTER — Emergency Department
Admission: EM | Admit: 2022-03-04 | Discharge: 2022-03-06 | Disposition: A | Discharge status: Home / Self Care | Payer: Managed Care, Other (non HMO) | Attending: Emergency Medicine | Admitting: Emergency Medicine

## 2022-03-04 ENCOUNTER — Other Ambulatory Visit: Payer: Self-pay

## 2022-03-04 DIAGNOSIS — F32A Depression, unspecified: Secondary | ICD-10-CM | POA: Diagnosis present

## 2022-03-04 DIAGNOSIS — Z20822 Contact with and (suspected) exposure to covid-19: Secondary | ICD-10-CM | POA: Diagnosis not present

## 2022-03-04 DIAGNOSIS — F332 Major depressive disorder, recurrent severe without psychotic features: Secondary | ICD-10-CM | POA: Diagnosis not present

## 2022-03-04 DIAGNOSIS — R45851 Suicidal ideations: Secondary | ICD-10-CM | POA: Diagnosis not present

## 2022-03-04 LAB — CBC
HCT: 43.3 % (ref 36.0–46.0)
Hemoglobin: 14.4 g/dL (ref 12.0–15.0)
MCH: 28 pg (ref 26.0–34.0)
MCHC: 33.3 g/dL (ref 30.0–36.0)
MCV: 84.1 fL (ref 80.0–100.0)
Platelets: 271 10*3/uL (ref 150–400)
RBC: 5.15 MIL/uL — ABNORMAL HIGH (ref 3.87–5.11)
RDW: 11.9 % (ref 11.5–15.5)
WBC: 8.7 10*3/uL (ref 4.0–10.5)
nRBC: 0 % (ref 0.0–0.2)

## 2022-03-04 LAB — URINE DRUG SCREEN, QUALITATIVE (ARMC ONLY)
Amphetamines, Ur Screen: NOT DETECTED
Barbiturates, Ur Screen: NOT DETECTED
Benzodiazepine, Ur Scrn: NOT DETECTED
Cannabinoid 50 Ng, Ur ~~LOC~~: POSITIVE — AB
Cocaine Metabolite,Ur ~~LOC~~: NOT DETECTED
MDMA (Ecstasy)Ur Screen: NOT DETECTED
Methadone Scn, Ur: NOT DETECTED
Opiate, Ur Screen: NOT DETECTED
Phencyclidine (PCP) Ur S: NOT DETECTED
Tricyclic, Ur Screen: NOT DETECTED

## 2022-03-04 LAB — COMPREHENSIVE METABOLIC PANEL
ALT: 41 U/L (ref 0–44)
AST: 25 U/L (ref 15–41)
Albumin: 4.6 g/dL (ref 3.5–5.0)
Alkaline Phosphatase: 100 U/L (ref 38–126)
Anion gap: 12 (ref 5–15)
BUN: 7 mg/dL (ref 6–20)
CO2: 22 mmol/L (ref 22–32)
Calcium: 9.5 mg/dL (ref 8.9–10.3)
Chloride: 104 mmol/L (ref 98–111)
Creatinine, Ser: 0.6 mg/dL (ref 0.44–1.00)
GFR, Estimated: >60 mL/min (ref >60)
Glucose, Bld: 80 mg/dL (ref 70–99)
Potassium: 3.7 mmol/L (ref 3.5–5.1)
Sodium: 138 mmol/L (ref 135–145)
Total Bilirubin: 1.7 mg/dL — ABNORMAL HIGH (ref 0.3–1.2)
Total Protein: 8.1 g/dL (ref 6.5–8.1)

## 2022-03-04 LAB — SALICYLATE LEVEL: Salicylate Lvl: <7 mg/dL — ABNORMAL LOW (ref 7.0–30.0)

## 2022-03-04 LAB — ACETAMINOPHEN LEVEL: Acetaminophen (Tylenol), Serum: <10 ug/mL — ABNORMAL LOW (ref 10–30)

## 2022-03-04 LAB — POC URINE PREG, ED: Preg Test, Ur: NEGATIVE

## 2022-03-04 LAB — ETHANOL: Alcohol, Ethyl (B): <10 mg/dL (ref <10)

## 2022-03-04 MED ORDER — BREAST MILK/FORMULA (FOR LABEL PRINTING ONLY): ORAL | Status: DC
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
## 2022-03-04 NOTE — ED Notes (Signed)
Pt resting in bed with lights dimmed, watching TV, NAD noted at this time.  ?

## 2022-03-04 NOTE — ED Notes (Signed)
Pt ambulatory to restroom with steady gait.

## 2022-03-04 NOTE — ED Notes (Addendum)
Pt dressed into burgundy scrubs, belongings to include: ? ?1 pair rainbow shoes ?1 pair white shorts ?1 black tshirt with rainbow lettering ?1 set car keys ?1 black cell phone ?1 grey necklace ?1 black hair band ?

## 2022-03-04 NOTE — ED Triage Notes (Signed)
Pt states that she has been feeling suicidal with a plan to "cut herself open"- pt states she has been feeling this way for months- pt states she is on zoloft and has not missed any doses ?

## 2022-03-04 NOTE — ED Notes (Signed)
VOL/pending psych consult 

## 2022-03-04 NOTE — ED Notes (Signed)
This RN called patient's husband Bruce with patient's permission and notified him of ability to store breast milk that patient pumped. Pt's husband Smitty Cords states understanding at this time.  ?

## 2022-03-04 NOTE — ED Provider Notes (Signed)
? ?[facility] ?Provider Note ? ? ? Event Date/Time  ? First MD Initiated Contact with Patient 03/04/22 1746   ?  (approximate) ? ? ?History  ? ?Suicidal ? ? ?HPI ? ?Jennifer Best is a 31 y.o. female  who, per ob/gyn note dated 01/19/22 has history of elevated BP, who presents to the emergency department today because of concerns for depression and thoughts of wanting to harm her self.  She states that she has been feeling depressed for roughly 2 months.  She has had some passive thoughts of SI over the past couple of days.  She states that she is seeing a therapist. No acute medical complaints.   ? ? ?Physical Exam  ? ?Triage Vital Signs: ?ED Triage Vitals  ?Enc Vitals Group  ?   BP 03/04/22 1718 (!) 173/99  ?   Pulse Rate 03/04/22 1710 85  ?   Resp 03/04/22 1710 18  ?   Temp 03/04/22 1710 98.7 ?F (37.1 ?C)  ?   Temp Source 03/04/22 1710 Oral  ?   SpO2 03/04/22 1710 98 %  ?   Weight 03/04/22 1707 209 lb (94.8 kg)  ?   Height 03/04/22 1707 5\' 5"  (1.651 m)  ?   Head Circumference --   ?   Peak Flow --   ?   Pain Score 03/04/22 1707 0  ?    ?    ?    ? ? ?Most recent vital signs: ?Vitals:  ? 03/04/22 1710 03/04/22 1718  ?BP:  (!) 173/99  ?Pulse: 85   ?Resp: 18   ?Temp: 98.7 ?F (37.1 ?C)   ?SpO2: 98%   ? ? ?General: Awake, no distress.  ?CV:  Good peripheral perfusion.  ?Resp:  Normal effort.  ?Abd:  No distention.  ?Psych:  Depressed. Withdrawn. ? ? ?ED Results / Procedures / Treatments  ? ?Labs ?(all labs ordered are listed, but only abnormal results are displayed) ?Labs Reviewed  ?COMPREHENSIVE METABOLIC PANEL - Abnormal; Notable for the following components:  ?    Result Value  ? Total Bilirubin 1.7 (*)   ? All other components within normal limits  ?SALICYLATE LEVEL - Abnormal; Notable for the following components:  ? Salicylate Lvl [id number] (*)   ? All other components within normal limits  ?ACETAMINOPHEN LEVEL - Abnormal; Notable for the following components:  ? Acetaminophen  (Tylenol), Serum <10 (*)   ? All other components within normal limits  ?CBC - Abnormal; Notable for the following components:  ? RBC 5.15 (*)   ? All other components within normal limits  ?ETHANOL  ?URINE DRUG SCREEN, QUALITATIVE ([facility] ONLY)  ?POC URINE PREG, ED  ? ? ? ?EKG ? ?None ? ? ?RADIOLOGY ?None ? ? ?PROCEDURES: ? ?Critical Care performed: No ? ?Procedures ? ? ?MEDICATIONS ORDERED IN ED: ?Medications - No data to display ? ? ?IMPRESSION / MDM / ASSESSMENT AND PLAN / ED COURSE  ?I reviewed the triage vital signs and the nursing notes. ?             ?               ? ?Differential diagnosis includes, but is not limited to, depression, suicidal ideation. ? ?Patient presents to the emergency department today because of concerns for depression.  On exam she is withdrawn and appears depressed.  She denies any medical complaints at this time.  Will have psychiatry evaluate. ? ?The patient has been  placed in psychiatric observation due to the need to provide a safe environment for the patient while obtaining psychiatric consultation and evaluation, as well as ongoing medical and medication management to treat the patient's condition.  The patient has not been placed under full IVC at this time. ? ? ?FINAL CLINICAL IMPRESSION(S) / ED DIAGNOSES  ? ?Final diagnoses:  ?Depression, unspecified depression type  ? ? ? ?Note:  This document was prepared using Dragon voice recognition software and may include unintentional dictation errors. ? ?  ?Nance Pear, MD ?03/04/22 1907 ? ?

## 2022-03-04 NOTE — ED Notes (Signed)
This RN discussed with EDP regarding patient pumping and requesting to store breast milk for baby. Pt with noted positive cannabinoid results in UDS. This RN educated patient regarding large amounts of marijuanna crossing into breast milk and being ingested by baby. This RN gave patient the option to continue to store her breast milk or to dump, pt opted to store breast milk and give to husband when he picked up. Pt states understanding of teaching. Hand pump cleaned at this time. Pt denies further needs, lights dimmed for comfort, pt currently resting in bed watching TV.  ?

## 2022-03-04 NOTE — ED Notes (Signed)
Pt notified staff that she needs a breast pump due to pumping. Pt moved to room 20 and provided a hand pump at this time to pump. Pt remains calm and cooperative with staff. Pt denies further needs at this time.  ?

## 2022-03-04 NOTE — ED Notes (Signed)
Pt provided snack, asked to provide urine sample when finished eating, pt states understanding.  ?

## 2022-03-04 NOTE — ED Notes (Signed)
1 bottle of breast milk taken to special care nursery to be stored in breast milk freezer pending pickup by patient's husband.  ?

## 2022-03-04 NOTE — ED Notes (Signed)
Pt provided extra warm blanket, visualized sitting up in bed eating snack, NAD noted at this time, pt denies further needs.  ?

## 2022-03-05 DIAGNOSIS — F332 Major depressive disorder, recurrent severe without psychotic features: Secondary | ICD-10-CM | POA: Diagnosis present

## 2022-03-05 LAB — RESP PANEL BY RT-PCR (FLU A&B, COVID) ARPGX2
Influenza A by PCR: NEGATIVE
Influenza B by PCR: NEGATIVE
SARS Coronavirus 2 by RT PCR: NEGATIVE

## 2022-03-05 MED ORDER — SERTRALINE HCL 50 MG PO TABS
100.0000 mg | ORAL_TABLET | Freq: Every day | ORAL | Status: DC
  Administered 2022-03-05 – 2022-03-06 (×2): 100 mg via ORAL
  Filled 2022-03-05 (×2): qty 2

## 2022-03-05 MED ORDER — COMPLETENATE 29-1 MG PO CHEW
1.0000 | CHEWABLE_TABLET | Freq: Every day | ORAL | Status: DC
  Filled 2022-03-05: qty 1

## 2022-03-05 MED ORDER — PRENATAL MULTIVITAMIN CH
1.0000 | ORAL_TABLET | Freq: Every day | ORAL | Status: DC
  Administered 2022-03-05: 1 via ORAL
  Filled 2022-03-05 (×2): qty 1

## 2022-03-05 NOTE — Consult Note (Signed)
[facility] Face[facility] Consult  ? ?Reason for Consult:  suicidal ideations ?Referring Physician:  EDP ?Patient Identification: Jennifer Thomos LemonsAlysha Rose Best ?MRN:  [id number] ?Principal Diagnosis: Major depressive disorder, recurrent severe without psychotic features (HCC) ?Diagnosis:  Principal Problem: ?  Major depressive disorder, recurrent severe without psychotic features (HCC) ? ? ?Total Time spent with patient: 45 minutes ? ?Subjective:   ?Jennifer Best is a 31 y.o. female patient admitted with suicidal ideations. ? ?HPI:  31 yo female who states, "I'm dealing with a lot of feelings of abandonment, feel like ending it."  High depression, started 2 months ago when her husband started talking about divorce, increased recently after the birth of their third child and missing her family who live in [place].  Reports multiple past attempts with no hospitalizations, plan to end her life with kitchen knives. High anxiety with panic attacks at times.  No hallucinations or homicidal ideations.  Occasional use of cannabis, no other substances or alcohol use.  No current provider, Zoloft started during her pregnancy, she is breast feeding. ? ?Past Psychiatric History: depression, anxiety ? ?Risk to Self:  yes ?Risk to Others:  none ?Prior Inpatient Therapy:  none ?Prior Outpatient Therapy:  none ? ?Past Medical History:  ?Past Medical History:  ?Diagnosis Date  ? Asthma   ? Gestational diabetes   ? Obesity (BMI 30-39.9)   ?  ?Past Surgical History:  ?Procedure Laterality Date  ? CHOLECYSTECTOMY    ? LAPAROSCOPY Left 07/17/2016  ? Procedure: LAPAROSCOPY DIAGNOSTIC;  Surgeon: Hildred LaserAnika Cherry, MD;  Location: [facility] ORS;  Service: Gynecology;  Laterality: Left;  removal ectopic pregnancy  ? TUBAL LIGATION Bilateral 07/28/2021  ? Procedure: POST PARTUM TUBAL LIGATION;  Surgeon: Hildred Laserherry, Anika, MD;  Location: [facility] ORS;  Service: Gynecology;  Laterality: Bilateral;  ? UNILATERAL SALPINGECTOMY Left 07/17/2016  ? Procedure:  UNILATERAL SALPINGECTOMY;  Surgeon: Hildred LaserAnika Cherry, MD;  Location: [facility] ORS;  Service: Gynecology;  Laterality: Left;  ? ?Family History:  ?Family History  ?Problem Relation Age of Onset  ? Lung cancer Mother   ? Breast cancer Maternal Grandmother   ? Lung cancer Paternal Grandmother   ? Prostate cancer Paternal Grandfather   ? Diabetes Paternal Grandfather   ? Diabetes Maternal Grandfather   ? Ovarian cancer Neg Hx   ? Colon cancer Neg Hx   ? ?Family Psychiatric  History: see above ?Social History:  ?Social History  ? ?Substance and Sexual Activity  ?Alcohol Use Not Currently  ? Comment: occass  ?   ?Social History  ? ?Substance and Sexual Activity  ?Drug Use No  ?  ?Social History  ? ?Socioeconomic History  ? Marital status: Married  ?  Spouse name: Not on file  ? Number of children: Not on file  ? Years of education: Not on file  ? Highest education level: Not on file  ?Occupational History  ? Not on file  ?Tobacco Use  ? Smoking status: Former  ? Smokeless tobacco: Former  ? Tobacco comments:  ?  1 cigarette per day - on and off x 2 years  ?Vaping Use  ? Vaping Use: Never used  ?Substance and Sexual Activity  ? Alcohol use: Not Currently  ?  Comment: occass  ? Drug use: No  ? Sexual activity: Yes  ?  Partners: Male, Female  ?  Comment: BTL  ?Other Topics Concern  ? Not on file  ?Social History Narrative  ? Not on file  ? ?Social Determinants of Health  ? ?  Financial Resource Strain: Not on file  ?Food Insecurity: Not on file  ?Transportation Needs: Not on file  ?Physical Activity: Not on file  ?Stress: Not on file  ?Social Connections: Not on file  ? ?Additional Social History: ?  ? ?Allergies:   ?Allergies  ?Allergen Reactions  ? Morphine And Related Other (See Comments)  ?  Makes "veins stand up"  ? Latex Rash and Other (See Comments)  ?  Watery eyes  ? ? ?Labs:  ?Results for orders placed or performed during the hospital encounter of 03/04/22 (from the past 48 hour(s))  ?Comprehensive metabolic panel     Status:  Abnormal  ? Collection Time: 03/04/22  5:09 PM  ?Result Value Ref Range  ? Sodium 138 135 - 145 mmol/L  ? Potassium 3.7 3.5 - 5.1 mmol/L  ? Chloride 104 98 - 111 mmol/L  ? CO2 22 22 - 32 mmol/L  ? Glucose, Bld 80 70 - 99 mg/dL  ?  Comment: Glucose reference range applies only to samples taken after fasting for at least 8 hours.  ? BUN 7 6 - 20 mg/dL  ? Creatinine, Ser 0.60 0.44 - 1.00 mg/dL  ? Calcium 9.5 8.9 - 10.3 mg/dL  ? Total Protein 8.1 6.5 - 8.1 g/dL  ? Albumin 4.6 3.5 - 5.0 g/dL  ? AST 25 15 - 41 U/L  ? ALT 41 0 - 44 U/L  ? Alkaline Phosphatase 100 38 - 126 U/L  ? Total Bilirubin 1.7 (H) 0.3 - 1.2 mg/dL  ? GFR, Estimated >60 >60 mL/min  ?  Comment: (NOTE) ?Calculated using the CKD-EPI Creatinine Equation (2021) ?  ? Anion gap 12 5 - 15  ?  Comment: Performed at [facility], [address] [place], [place] [address]  ?Ethanol     Status: None  ? Collection Time: 03/04/22  5:09 PM  ?Result Value Ref Range  ? Alcohol, Ethyl (B) <10 <10 mg/dL  ?  Comment: (NOTE) ?Lowest detectable limit for serum alcohol is 10 mg/dL. ? ?For medical purposes only. ?Performed at [facility], [address] [place], ?[place] [address] ?  ?Salicylate level     Status: Abnormal  ? Collection Time: 03/04/22  5:09 PM  ?Result Value Ref Range  ? Salicylate Lvl <7.0 (L) 7.0 - 30.0 mg/dL  ?  Comment: Performed at [facility], [address] [place], [place] [address]  ?Acetaminophen level     Status: Abnormal  ? Collection Time: 03/04/22  5:09 PM  ?Result Value Ref Range  ? Acetaminophen (Tylenol), Serum <10 (L) 10 - 30 ug/mL  ?  Comment: (NOTE) ?Therapeutic concentrations vary significantly. A range of 10-30 ug/mL  ?may be an effective concentration for many patients. However, some  ?are best treated at concentrations outside of this range. ?Acetaminophen concentrations >150 ug/mL at 4 hours after ingestion  ?and >50 ug/mL at 12 hours after ingestion are often associated with  ?toxic  reactions. ? ?Performed at [facility], [address] [place], ?[place] [address] ?  ?cbc     Status: Abnormal  ? Collection Time: 03/04/22  5:09 PM  ?Result Value Ref Range  ? WBC 8.7 4.0 - 10.5 K/uL  ? RBC 5.15 (H) 3.87 - 5.11 MIL/uL  ? Hemoglobin 14.4 12.0 - 15.0 g/dL  ? HCT 43.3 36.0 - 46.0 %  ? MCV 84.1 80.0 - 100.0 fL  ? MCH 28.0 26.0 - 34.0 pg  ? MCHC 33.3 30.0 - 36.0 g/dL  ?  RDW 11.9 11.5 - 15.5 %  ? Platelets 271 150 - 400 K/uL  ? nRBC 0.0 0.0 - 0.2 %  ?  Comment: Performed at [facility], [address] [place], [place] [address]  ?Urine Drug Screen, Qualitative     Status: Abnormal  ? Collection Time: 03/04/22  7:02 PM  ?Result Value Ref Range  ? Tricyclic, Ur Screen NONE DETECTED NONE DETECTED  ? Amphetamines, Ur Screen NONE DETECTED NONE DETECTED  ? MDMA (Ecstasy)Ur Screen NONE DETECTED NONE DETECTED  ? Cocaine Metabolite,Ur [place] NONE DETECTED NONE DETECTED  ? Opiate, Ur Screen NONE DETECTED NONE DETECTED  ? Phencyclidine (PCP) Ur S NONE DETECTED NONE DETECTED  ? Cannabinoid 50 Ng, Ur [place] POSITIVE (A) NONE DETECTED  ? Barbiturates, Ur Screen NONE DETECTED NONE DETECTED  ? Benzodiazepine, Ur Scrn NONE DETECTED NONE DETECTED  ? Methadone Scn, Ur NONE DETECTED NONE DETECTED  ?  Comment: (NOTE) ?Tricyclics + metabolites, urine    Cutoff 1000 ng/mL ?Amphetamines + metabolites, urine  Cutoff 1000 ng/mL ?MDMA (Ecstasy), urine              Cutoff 500 ng/mL ?Cocaine Metabolite, urine          Cutoff 300 ng/mL ?Opiate + metabolites, urine        Cutoff 300 ng/mL ?Phencyclidine (PCP), urine         Cutoff 25 ng/mL ?Cannabinoid, urine                 Cutoff 50 ng/mL ?Barbiturates + metabolites, urine  Cutoff 200 ng/mL ?Benzodiazepine, urine              Cutoff 200 ng/mL ?Methadone, urine                   Cutoff 300 ng/mL ? ?The urine drug screen provides only a preliminary, unconfirmed ?analytical test result and should not be used for non-medical ?purposes. Clinical consideration and  professional judgment should ?be applied to any positive drug screen result due to possible ?interfering substances. A more specific alternate chemical method ?must be used in order to obtain a confirmed analytical result. ?Gas ch

## 2022-03-05 NOTE — ED Notes (Signed)
Pt given hand pump to pump breast milk per her request.  ?

## 2022-03-05 NOTE — ED Notes (Signed)
1 bottle of breast milk taken up to SCN for storage. ?

## 2022-03-05 NOTE — ED Notes (Signed)
Received call from [facility], they can allow the patient to pump but they have now where that the patient can store the milk and she would have to dump. ?

## 2022-03-05 NOTE — ED Notes (Signed)
2 bottles of breast milk taken to 3rd floor to be stored in freezer.  ?

## 2022-03-05 NOTE — ED Notes (Signed)
IVC/Accepted to [facility] on 03/06/22  ?

## 2022-03-05 NOTE — ED Notes (Signed)
Spoke with Helyn App at [facility] and they agreed to hold bed until TTS can speak with patient and come up with a plan regarding storage of breast milk. ?

## 2022-03-05 NOTE — ED Notes (Signed)
1 bottle of breast milk sent to 3rd floor by this writer ?

## 2022-03-05 NOTE — BH Assessment (Signed)
Referral information for Psychiatric Hospitalization faxed to;  ? ?[facility] [phone]),  ? ?[facility] ([phone]--[phone]) ? ?[facility]enn[facility] Best [phone]), ? ?[facility] [phone], [phone], [phone] or [phone]),  ? ?[facility] [phone] or [phone]) ? ?[facility] [phone]),  ? ?[facility]enn[facility] Best [phone] -[phone]),  ? ?[facility] [phone]). ? ?[facility] [phone]) ?

## 2022-03-05 NOTE — ED Notes (Signed)
Pt received breakfast tray. Pt was still laying in the bed with eyes closed at this time.  ?

## 2022-03-05 NOTE — ED Notes (Signed)
Pt did not want snacks. Pt was provided with water.  ?

## 2022-03-05 NOTE — ED Notes (Signed)
Patient asking for breast pump. ?

## 2022-03-05 NOTE — ED Notes (Signed)
Dinner tray received, PT calm and copperative ?

## 2022-03-05 NOTE — ED Notes (Signed)
Pt resting in bed with NAD noted at this time. Lights remain dimmed, TV on. Pt visualized in NAD at this time.  ?

## 2022-03-05 NOTE — ED Notes (Addendum)
Pt properly disposed of trash ?

## 2022-03-05 NOTE — BH Assessment (Signed)
Patient has been accepted to [facility], Pending COVID Results  ?Patient assigned to [facility] ?Accepting physician is Dr. Otho Perl.  ?Call report to [phone].  ?Representative was [employer].  ? ?ER Staff is aware of it:  ?Glenda, ER Secretary ?Dawn, Patient's Nurse ?    ? ?Address: ?[address],  ?[place], [place] [address] ? ?Bed available tomorrow (03/06/2022) after 9:00am ?

## 2022-03-06 DIAGNOSIS — F332 Major depressive disorder, recurrent severe without psychotic features: Secondary | ICD-10-CM | POA: Diagnosis not present

## 2022-03-06 NOTE — Discharge Instructions (Signed)
Cleared by psychiatry for discharge home. 

## 2022-03-06 NOTE — ED Provider Notes (Signed)
Emergency Medicine Observation Re-evaluation Note ? ?Jennifer Best is a 31 y.o. female, seen on rounds today.  Pt initially presented to the ED for complaints of Suicidal ?Currently, the patient is resting comfortably. ? ?Physical Exam  ?BP 137/89 (BP Location: Left Arm)   Pulse 72   Temp 97.9 ?F (36.6 ?C) (Oral)   Resp 20   Ht 5\' 5"  (1.651 m)   Wt 94.8 kg   SpO2 96%   BMI 34.78 kg/m?  ?Physical Exam ?Constitutional:   ?   Appearance: She is not ill-appearing or toxic-appearing.  ?Cardiovascular:  ?   Comments: Appears well perfused ?Pulmonary:  ?   Effort: Pulmonary effort is normal.  ?Musculoskeletal:     ?   General: No deformity.  ?Neurological:  ?   General: No focal deficit present.  ?Psychiatric:  ?   Comments: No emotional distress  ? ? ?ED Course / MDM  ?EKG:  ? ?I have reviewed the labs performed to date as well as medications administered while in observation.  Recent changes in the last 24 hours include acceptance to [facility]. ? ?Plan  ?Current plan is for placement . ?Jennifer Best is under involuntary commitment. ?  ? ?  ? , MD ?03/06/22 [phone] ? ?

## 2022-03-06 NOTE — ED Notes (Signed)
Patient requested breast pump. ?

## 2022-03-06 NOTE — ED Notes (Signed)
Breast pump returned.  2 bottles labeled and taken to [facility] for storage. ?

## 2022-03-06 NOTE — ED Notes (Signed)
Breakfast tray and water provided. Pt alert and calm at this time.  ?

## 2022-03-06 NOTE — ED Notes (Signed)
[facility]  PAPERS  RESCINDED PER  LOUISE  NP  INFORMED  ANDREA  RN 

## 2022-03-06 NOTE — ED Notes (Signed)
Pt given breast pump supplies  ?
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
## 2022-03-06 NOTE — ED Notes (Signed)
One and a half bottles of pts breast milk taken to [facility] care [facility] to store in freezer. ?

## 2022-03-06 NOTE — ED Notes (Signed)
Pt up to restroom with steady gait. No distress noted.  

## 2022-03-06 NOTE — ED Notes (Signed)
Pt requesting shower supplies. Pt is currently taking a shower. No other needs found.  ? ? ?

## 2022-03-06 NOTE — ED Notes (Signed)
Breast pump supplies with small bottle of breast milk returned to this nurse. Labeled and prepared to take to third floor. ?

## 2022-03-09 ENCOUNTER — Encounter: Payer: Self-pay | Admitting: Obstetrics and Gynecology

## 2022-03-09 ENCOUNTER — Ambulatory Visit (INDEPENDENT_AMBULATORY_CARE_PROVIDER_SITE_OTHER): Payer: Managed Care, Other (non HMO) | Admitting: Obstetrics and Gynecology

## 2022-03-09 VITALS — BP 126/86 | HR 74 | Resp 16 | Ht 65.0 in | Wt 205.7 lb

## 2022-03-09 DIAGNOSIS — E038 Other specified hypothyroidism: Secondary | ICD-10-CM | POA: Diagnosis not present

## 2022-03-09 DIAGNOSIS — R45851 Suicidal ideations: Secondary | ICD-10-CM | POA: Diagnosis not present

## 2022-03-09 DIAGNOSIS — Z01419 Encounter for gynecological examination (general) (routine) without abnormal findings: Secondary | ICD-10-CM

## 2022-03-09 DIAGNOSIS — E669 Obesity, unspecified: Secondary | ICD-10-CM

## 2022-03-09 DIAGNOSIS — E66811 Obesity, class 1: Secondary | ICD-10-CM

## 2022-03-09 DIAGNOSIS — F331 Major depressive disorder, recurrent, moderate: Secondary | ICD-10-CM | POA: Diagnosis not present

## 2022-03-09 DIAGNOSIS — Z8632 Personal history of gestational diabetes: Secondary | ICD-10-CM

## 2022-03-09 DIAGNOSIS — N912 Amenorrhea, unspecified: Secondary | ICD-10-CM

## 2022-03-09 NOTE — Patient Instructions (Addendum)
Preventive Care 61-32 Years Old, Female ?Preventive care refers to lifestyle choices and visits with your health care provider that can promote health and wellness. Preventive care visits are also called wellness exams. ?What can I expect for my preventive care visit? ?Counseling ?During your preventive care visit, your health care provider may ask about your: ?Medical history, including: ?Past medical problems. ?Family medical history. ?Pregnancy history. ?Current health, including: ?Menstrual cycle. ?Method of birth control. ?Emotional well-being. ?Home life and relationship well-being. ?Sexual activity and sexual health. ?Lifestyle, including: ?Alcohol, nicotine or tobacco, and drug use. ?Access to firearms. ?Diet, exercise, and sleep habits. ?Work and work Statistician. ?Sunscreen use. ?Safety issues such as seatbelt and bike helmet use. ?Physical exam ?Your health care provider may check your: ?Height and weight. These may be used to calculate your BMI (body mass index). BMI is a measurement that tells if you are at a healthy weight. ?Waist circumference. This measures the distance around your waistline. This measurement also tells if you are at a healthy weight and may help predict your risk of certain diseases, such as type 2 diabetes and high blood pressure. ?Heart rate and blood pressure. ?Body temperature. ?Skin for abnormal spots. ?What immunizations do I need? ?Vaccines are usually given at various ages, according to a schedule. Your health care provider will recommend vaccines for you based on your age, medical history, and lifestyle or other factors, such as travel or where you work. ?What tests do I need? ?Screening ?Your health care provider may recommend screening tests for certain conditions. This may include: ?Pelvic exam and Pap test. ?Lipid and cholesterol levels. ?Diabetes screening. This is done by checking your blood sugar (glucose) after you have not eaten for a while (fasting). ?Hepatitis B  test. ?Hepatitis C test. ?HIV (human immunodeficiency virus) test. ?STI (sexually transmitted infection) testing, if you are at risk. ?BRCA-related cancer screening. This may be done if you have a family history of breast, ovarian, tubal, or peritoneal cancers. ?Talk with your health care provider about your test results, treatment options, and if necessary, the need for more tests. ?Follow these instructions at home: ?Eating and drinking ? ?Eat a healthy diet that includes fresh fruits and vegetables, whole grains, lean protein, and low-fat dairy products. ?Take vitamin and mineral supplements as recommended by your health care provider. ?Do not drink alcohol if: ?Your health care provider tells you not to drink. ?You are pregnant, may be pregnant, or are planning to become pregnant. ?If you drink alcohol: ?Limit how much you have to 0-1 drink a day. ?Know how much alcohol is in your drink. In the [place] one drink equals one 12 oz bottle of beer (355 mL), one 5 oz glass of wine (148 mL), or one 1? oz glass of hard liquor (44 mL). ?Lifestyle ?Brush your teeth every morning and night with fluoride toothpaste. Floss one time each day. ?Exercise for at least 30 minutes 5 or more days each week. ?Do not use any products that contain nicotine or tobacco. These products include cigarettes, chewing tobacco, and vaping devices, such as e-cigarettes. If you need help quitting, ask your health care provider. ?Do not use drugs. ?If you are sexually active, practice safe sex. Use a condom or other form of protection to prevent STIs. ?If you do not wish to become pregnant, use a form of birth control. If you plan to become pregnant, see your health care provider for a prepregnancy visit. ?Find healthy ways to manage stress, such as: ?Meditation, yoga,  or listening to music. ?Journaling. ?Talking to a trusted person. ?Spending time with friends and family. ?Minimize exposure to UV radiation to reduce your risk of skin  cancer. ?Safety ?Always wear your seat belt while driving or riding in a vehicle. ?Do not drive: ?If you have been drinking alcohol. Do not ride with someone who has been drinking. ?If you have been using any mind-altering substances or drugs. ?While texting. ?When you are tired or distracted. ?Wear a helmet and other protective equipment during sports activities. ?If you have firearms in your house, make sure you follow all gun safety procedures. ?Seek help if you have been physically or sexually abused. ?What's next? ?Go to your health care provider once a year for an annual wellness visit. ?Ask your health care provider how often you should have your eyes and teeth checked. ?Stay up to date on all vaccines. ?This information is not intended to replace advice given to you by your health care provider. Make sure you discuss any questions you have with your health care provider. ?Document Revised: 05/11/2021 Document Reviewed: 05/11/2021 ?Elsevier Patient Education ? [place]. ? ?
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
## 2022-03-10 ENCOUNTER — Ambulatory Visit: Payer: Managed Care, Other (non HMO) | Admitting: Physical Therapy

## 2022-03-10 ENCOUNTER — Encounter: Payer: Self-pay | Admitting: Physical Therapy

## 2022-03-10 DIAGNOSIS — R293 Abnormal posture: Secondary | ICD-10-CM

## 2022-03-10 DIAGNOSIS — R278 Other lack of coordination: Secondary | ICD-10-CM

## 2022-03-10 DIAGNOSIS — M6281 Muscle weakness (generalized): Secondary | ICD-10-CM

## 2022-03-10 LAB — THYROID PANEL WITH TSH
Free Thyroxine Index: 2.8 (ref 1.2–4.9)
T3 Uptake Ratio: 29 % (ref 24–39)
T4, Total: 9.7 ug/dL (ref 4.5–12.0)
TSH: 0.467 u[IU]/mL (ref 0.450–4.500)

## 2022-03-10 LAB — HEMOGLOBIN A1C
Est. average glucose Bld gHb Est-mCnc: 94 mg/dL
Hgb A1c MFr Bld: 4.9 % (ref 4.8–5.6)

## 2022-03-10 NOTE — Therapy (Signed)
?OUTPATIENT PHYSICAL THERAPY TREATMENT NOTE ? ? ?Patient Name: Jennifer Best ?MRN: [id number] ?DOB:1991-04-16, 31 y.o., female ?Today's Date: 03/10/2022 ? ?PCP: Hildred Laser, MD ?REFERRING PROVIDER: Hildred Laser, MD ? ? PT End of Session - 03/10/22 0937   ? ? Visit Number 4   ? Number of Visits 12   ? Date for PT Re-Evaluation 05/05/22   ? Authorization Type IE 02/10/2022   ? PT Start Time 0930   ? PT Stop Time 1015   ? PT Time Calculation (min) 45 min   ? Activity Tolerance Patient tolerated treatment well   ? Behavior During Therapy [facility] for tasks assessed/performed   ? ?  ?  ? ?  ? ? ?Past Medical History:  ?Diagnosis Date  ? Asthma   ? Gestational diabetes   ? Obesity (BMI 30-39.9)   ? ?Past Surgical History:  ?Procedure Laterality Date  ? CHOLECYSTECTOMY    ? LAPAROSCOPY Left 07/17/2016  ? Procedure: LAPAROSCOPY DIAGNOSTIC;  Surgeon: Hildred Laser, MD;  Location: [facility];  Service: Gynecology;  Laterality: Left;  removal ectopic pregnancy  ? TUBAL LIGATION Bilateral 07/28/2021  ? Procedure: POST PARTUM TUBAL LIGATION;  Surgeon: Hildred Laser, MD;  Location: [facility];  Service: Gynecology;  Laterality: Bilateral;  ? UNILATERAL SALPINGECTOMY Left 07/17/2016  ? Procedure: UNILATERAL SALPINGECTOMY;  Surgeon: Hildred Laser, MD;  Location: [facility];  Service: Gynecology;  Laterality: Left;  ? ?Patient Active Problem List  ? Diagnosis Date Noted  ? Major depressive disorder, recurrent severe without psychotic features (HCC) 03/05/2022  ? PROM (premature rupture of membranes) 07/26/2021  ? Irregular uterine contractions 07/25/2021  ? Obesity in pregnancy, antepartum, third trimester   ? Pregnancy 07/18/2021  ? Diet controlled gestational diabetes mellitus (GDM) in third trimester 06/03/2021  ? Subclinical hyperthyroidism 02/23/2021  ? History of gestational diabetes in prior pregnancy, currently pregnant 01/27/2021  ? Anxiety in pregnancy, antepartum 01/27/2021  ? Bronchitis 08/08/2017  ? Encounter to establish  care 08/08/2017  ? Obesity (BMI 35.0-39.9 without comorbidity) 12/30/2016  ? History of ectopic pregnancy 06/30/2016  ? ? ?REFERRING DIAG: N81.10 (ICD-10-CM) - Pelvic organ prolapse quantification stage 2 cystocele  ? ?THERAPY DIAG:  ?Other lack of coordination ? ?Abnormal posture ? ?Muscle weakness (generalized) ? ? ?PRECAUTIONS: None ? ?SUBJECTIVE: Patient reports that when she performs hygiene she no longer feels a bulge or tissue. Patient states that she had no issues with exercises outside of occasionally forgetting to do them. Patient denies any leakage.  ? ?PAIN:  ?Are you having pain? No ? ? ?TREATMENT ? ?Physical assessment 02/17/2022 ?ABDOMINAL:  ?Palpation: no TTP ?Diastasis: 1.5 finger superior, 2.5 finger at umbilicus, 1.5 finger inferior to umbilicus ?Rib flare: none noted ? ?Manual Therapy: ? ? ?Neuromuscular Re-education: ?Patient performed core stabilization and gravity assisted prolapse repositioning interventions as follows: ?Diaphragmatic breathing with DRAM compression ?TrA activation in supine hooklying ?TrA activation with mini march ?Pelvic tilts with TrA activation in hooklying ?Bridge ?Bridge with SLS lower phase ?Hooklying trunk rotations as needed ? ?Therapeutic Exercise: ? ? ?Treatments unbilled: ? ?Post-treatment assessment: ? ?Patient educated throughout session on appropriate technique and form using multi-modal cueing, HEP, and activity modification. Patient articulated understanding and returned demonstration. ? ?Patient Response to interventions: ?Notes good workout ? ? ? ? PT Long Term Goals - 02/10/22 1214   ? ?  ? PT LONG TERM GOAL #1  ? Title Patient will demonstrate independence with HEP in order to maximize therapeutic gains and improve carryover from physical  therapy sessions to ADLs in the home and community.   ? Baseline IE: not initiated; 4/7: comfortable with current HEP  ? Time 12   ? Period Weeks   ? Status On-going   ? Target Date 05/05/22   ?  ? PT LONG TERM GOAL #2   ? Title Patient will demonstrate improved function as evidenced by a score of <8 on FOTO measure for full participation in activities at home and in the community.   ? Baseline IE: 17; 4/7: 29  ? Time 12   ? Period Weeks   ? Status On-going   ? Target Date 05/05/22   ?  ? PT LONG TERM GOAL #3  ? Title Patient will demonstrate circumferential and sequential contraction of >4/5 MMT, > 6 sec hold x10 and 5 consecutive quick flicks with </= 10 min rest between testing bouts, and relaxation of the PFM coordinated with breath for improved management of intra-abdominal pressure and normal bowel and bladder function without the presence of pain nor incontinence in order to improve participation at home and in the community.   ? Baseline IE: not demonstrated; 4/7: 3/5 MMT x4 quick flicks, 4 sec hold x2  ? Time 12   ? Period Weeks   ? Status On-going   ? Target Date 05/05/22   ?  ? PT LONG TERM GOAL #4  ? Title Patient will be able to articulate and demonstrate 3-5 postures that encourage gravity assisted repositioning of pelvic organs to decrease discomfort and need for manual insertion of tissue at end of day in order to participate more fully in activities at home and in the community.   ? Baseline IE: not demonstrated; 4/7: child's pose  ? Time 12   ? Period Weeks   ? Status New   ? Target Date 05/05/22   ? ?  ?  ? ?  ? ? ? Plan - 03/03/22 1141   ? ? Clinical Impression Statement Patient presents to clinic with excellent motivation to participate in therapy. Patient demonstrates deficits in posture, IAP management, PFM strength, and PFM coordination. Patient has reduced DRAM to < 3 fingers throughout linea alba and able to control core/posture with more advanced abdominal training during today's session and responded positively to active interventions. Patient will benefit from continued skilled therapeutic intervention to address remaining deficits in posture, IAP management, PFM strength, and PFM coordination in order  to increase function and improve overall QOL.   ? Personal Factors and Comorbidities Age;Comorbidity 3+;Past/Current Experience;Time since onset of injury/illness/exacerbation   ? Comorbidities anxiety, asthma, gestational diabetes, obesity   ? Examination-Activity Limitations Sit;Stand;Lift;Squat;Continence   ? Examination-Participation Restrictions Occupation;Driving;Community Activity   ? Stability/Clinical Decision Making Evolving/Moderate complexity   ? Rehab Potential Good   ? PT Frequency 1x / week   ? PT Duration 12 weeks   ? PT Treatment/Interventions ADLs/Self Care Home Management;Cryotherapy;Electrical Stimulation;Moist Heat;Therapeutic activities;Therapeutic exercise;Neuromuscular re-education;Patient/family education;Orthotic Fit/Training;Manual techniques;Scar mobilization;Taping;Joint Manipulations;Spinal Manipulations   ? PT Next Visit Plan quadruped core stabilization; hip strengthening   ? PT Home Exercise Plan [email or web address]   ? Consulted and Agree with Plan of Care Patient   ? ?  ?  ? ?  ? ? ? ? ?Sheria Lang PT, DPT [phone]  ?03/10/2022, 9:38 AM ? ?  ? ?

## 2022-03-17 ENCOUNTER — Encounter: Payer: Managed Care, Other (non HMO) | Admitting: Physical Therapy

## 2022-03-24 ENCOUNTER — Encounter: Payer: Managed Care, Other (non HMO) | Admitting: Physical Therapy

## 2022-03-31 ENCOUNTER — Encounter: Payer: Managed Care, Other (non HMO) | Admitting: Physical Therapy

## 2022-03-31 ENCOUNTER — Telehealth: Payer: Self-pay | Admitting: Physical Therapy

## 2022-03-31 NOTE — Telephone Encounter (Signed)
Called patient regarding missed appointment this morning 03/31/2022 9:30A. No answer and voicemail full/unable to leave message.  ? ?Myles Gip PT, DPT [phone]  ?

## 2022-08-31 ENCOUNTER — Encounter: Payer: Self-pay | Admitting: Obstetrics and Gynecology

## 2022-09-04 ENCOUNTER — Encounter: Payer: Self-pay | Admitting: Certified Nurse Midwife

## 2022-09-07 ENCOUNTER — Encounter: Payer: Self-pay | Admitting: Certified Nurse Midwife
